# Patient Record
Sex: Male | Born: 1945 | Race: Black or African American | Hispanic: No | Smoking: Current every day smoker
Health system: Southern US, Community
[De-identification: ages and names within clinical notes are randomized; demographics above are authoritative.]

## PROBLEM LIST (undated history)

## (undated) DIAGNOSIS — J449 Chronic obstructive pulmonary disease, unspecified: Secondary | ICD-10-CM

## (undated) DIAGNOSIS — N189 Chronic kidney disease, unspecified: Secondary | ICD-10-CM

## (undated) DIAGNOSIS — I10 Essential (primary) hypertension: Secondary | ICD-10-CM

## (undated) DIAGNOSIS — E119 Type 2 diabetes mellitus without complications: Secondary | ICD-10-CM

## (undated) DIAGNOSIS — IMO0002 Reserved for concepts with insufficient information to code with codable children: Secondary | ICD-10-CM

## (undated) DIAGNOSIS — G4730 Sleep apnea, unspecified: Secondary | ICD-10-CM

## (undated) DIAGNOSIS — I2510 Atherosclerotic heart disease of native coronary artery without angina pectoris: Secondary | ICD-10-CM

## (undated) DIAGNOSIS — E785 Hyperlipidemia, unspecified: Secondary | ICD-10-CM

## (undated) HISTORY — DX: Chronic kidney disease, unspecified: N18.9

## (undated) HISTORY — DX: Hyperlipidemia, unspecified: E78.5

## (undated) HISTORY — DX: Type 2 diabetes mellitus without complications: E11.9

---
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
## 1968-08-17 DIAGNOSIS — J310 Chronic rhinitis: Secondary | ICD-10-CM | POA: Insufficient documentation

## 2003-07-16 ENCOUNTER — Encounter: Admission: RE | Admit: 2003-07-16 | Discharge: 2003-10-14 | Payer: Self-pay | Admitting: Internal Medicine

## 2004-12-08 ENCOUNTER — Encounter (INDEPENDENT_AMBULATORY_CARE_PROVIDER_SITE_OTHER): Payer: Self-pay | Admitting: Specialist

## 2004-12-08 ENCOUNTER — Ambulatory Visit (HOSPITAL_COMMUNITY): Admission: RE | Admit: 2004-12-08 | Discharge: 2004-12-08 | Payer: Self-pay | Admitting: Gastroenterology

## 2006-12-02 ENCOUNTER — Inpatient Hospital Stay (HOSPITAL_COMMUNITY): Admission: AD | Admit: 2006-12-02 | Discharge: 2006-12-03 | Payer: Self-pay | Admitting: Internal Medicine

## 2006-12-03 ENCOUNTER — Ambulatory Visit: Payer: Self-pay | Admitting: Vascular Surgery

## 2006-12-03 ENCOUNTER — Encounter (INDEPENDENT_AMBULATORY_CARE_PROVIDER_SITE_OTHER): Payer: Self-pay | Admitting: Cardiology

## 2007-09-04 ENCOUNTER — Emergency Department (HOSPITAL_COMMUNITY): Admission: EM | Admit: 2007-09-04 | Discharge: 2007-09-04 | Payer: Self-pay | Admitting: *Deleted

## 2010-02-03 ENCOUNTER — Emergency Department (HOSPITAL_COMMUNITY)
Admission: EM | Admit: 2010-02-03 | Discharge: 2010-02-03 | Payer: Self-pay | Source: Home / Self Care | Admitting: Emergency Medicine

## 2010-11-02 LAB — POCT I-STAT, CHEM 8
Creatinine, Ser: 0.9 mg/dL (ref 0.4–1.5)
HCT: 52 % (ref 39.0–52.0)
Sodium: 140 mEq/L (ref 135–145)

## 2011-01-02 NOTE — Consult Note (Signed)
[name], [name]             ACCOUNT NO.:  [id number]   MEDICAL RECORD NO.:  [id number]          PATIENT TYPE:  INP   LOCATION:  4728                         FACILITY:  [facility]   PHYSICIAN:  Francisca December, M.D.  DATE OF BIRTH:  07/08/1946   DATE OF CONSULTATION:  12/02/2006  DATE OF DISCHARGE:                                 CONSULTATION   REASON FOR REFERRAL:  Cough-induced syncope.   HISTORY OF PRESENT ILLNESS:  [name] is a pleasant 65-year-  old gentleman who has a history of elevated cholesterol and hypertension  but without prior cardiac diagnoses.  He has been experiencing a very  productive cough of clear phlegm for the past two weeks.  Most recently  he has had very severe coughing spells which have induced syncope on  three occasions; one observed in the physician's office this morning.  He has a remote history of this as well.  Approximately one year ago he  had a coughing spell while driving and almost passed out.  There is  associated flushing and warm sensation, but it does not quite break into  a sweat.  No nausea.  He has not had chest discomfort, shortness of  breath, lightheadedness.  Of note, his daughter states that he appears  to be almost gagging when he has passed out recently.   PAST MEDICAL HISTORY:  1. Elevated cholesterol.  2. Erectile dysfunction.  3. Hypertension.  4. Tobacco usage.   CURRENT MEDICATIONS:  1. Caduet 5/10 one p.o. daily.  2. Aspirin 81 mg p.o. daily.  3. Viagra p.r.n.   DRUG ALLERGIES:  None known.   PAST SURGICAL HISTORY:  None.   FAMILY HISTORY:  Father died of coronary disease and had bypass surgery  and diabetes.  Father's unknown.   SOCIAL HISTORY:  He is divorced with four children.  He is an Ship broker.  He has a 40 pack-year history of tobacco use, occasional  ethanol.  No illicit drugs.   REVIEW OF SYSTEMS:  Otherwise, negative.   PHYSICAL EXAMINATION:  GENERAL:  This is a 65 year old  pleasant,  cooperative African-American male in no distress, well-kempt.  HEENT:  Unremarkable.  NECK:  Supple without thyromegaly or masses, and carotid upstrokes are  normal.  There is no bruit.  There is no JVD.  CHEST:  Clear with good excursion bilaterally.  HEART:  Slightly bradycardic, normal S1, S2.  No murmur, click, gallop  or rub.  ABDOMEN:  Soft, nontender.  No midline pulsatile mass.  GENITALIA:  External genitalia:  Without lesions.  RECTAL:  Not performed.  EXTREMITIES:  Show full range of motion.  No edema.  Intact distal  pulses.  NEUROLOGICALLY:  There are no focal deficits.  SKIN:  Warm, dry, and clear.   ACCESSORY CLINICAL DATA:  Much of this is pending.  Electrocardiogram  from the office shows sinus rhythm and normal EKG.   The rate on his resting EKG and telemetry is in the 50 beat per minute  range.   Chest x-ray is pending.   I performed carotid sinus massage bilaterally without significant  heart  rate slowing.  I also had him cough for about 30 seconds without any  significant slowing.  His heart rate was observed on the monitor at all  times.  He relates that he is not able to reproduce the intense cough  that  produced his passing out recently.   IMPRESSION:  1. Cough-induced syncope very likely vagal mediated.  2. Resting bradycardia and the absence of any heart rate slowing      medications suggest sinus node dysfunction.  3. Hypertension.  4. Elevated cholesterol.  5. Erectile dysfunction.   PLAN:  1. Therapy at this point should be directed at relief of cough and      include antihistamines, Mucinex, cough suppressant.  2. Antibiotics at your discretion.  3. Check chest x-ray.  4. 2-D echocardiogram.  5. Telemetry monitoring and possibly ambulatory 24-hour monitoring as      an outpatient.  6. I will discuss this syndrome with my colleagues and have a better      impression of the importance of a pacemaker in their view.      Certainly  the concern is in the future with a spontaneous coughing      episode he could have syncope at any time possibly inducing harm to      himself or others.   Thank you very much for allowing me to assist in the care of [name].  It has been a pleasure to do so.  I will discuss his further  care with you.      Francisca December, M.D.  Electronically Signed     JHE/MEDQ  D:  12/02/2006  T:  12/03/2006  Job:  [id number]   cc:   Georgann Housekeeper, MD

## 2011-01-02 NOTE — Discharge Summary (Signed)
NAMEFERLANDO, Robert Combs             ACCOUNT NO.:  [id number]   MEDICAL RECORD NO.:  [id number]          PATIENT TYPE:  INP   LOCATION:  4728                         FACILITY:  [facility]   PHYSICIAN:  Georgann Housekeeper, MD      DATE OF BIRTH:  [birth year]   DATE OF ADMISSION:  12/02/2006  DATE OF DISCHARGE:  12/03/2006                               DISCHARGE SUMMARY   DISCHARGE DIAGNOSES:  1. Syncope, cough related.  2. History of hypertension.  3. History of bronchitis.  4. Elevated CPK secondary to possible statin.   MEDICATIONS AT DISCHARGE:  1. Caduet.  2. Norvasc 5/10 mg.  3. Aspirin 325 mg daily.   CONSULTATIONS:  Cardiology.   PROCEDURE:  2D echocardiogram.  Normal echocardiogram.  Carotid  ultrasounds negative for any blockage.   HOSPITAL COURSE:  The patient is 65 years old.  He has a history of  hypertension and dyslipidemia.  He was admitted after multiple syncopal  episodes.  Each one after coughing for a few seconds.  The patient was  admitted to telemetry to rule out for any arrhythmias.  Cardiology was  consulted.  Cardiac markers were negative.  Doppler carotid  echocardiogram was negative.  The patient had no more syncopal episodes.  There was no evidence of brady arrhythmias.  The patient finished a  course of antibiotics.  As far as his CPK elevation subsequent to his  Lipitor, which was stopped and cardiac markers started to come down.  He  will followup as an outpatient.  As far as the bronchitis continue on Z-  Pak.  Finish the course as well as the cough syrup.  The patient was  discharged home to followup with cardiology.  The patient also had a  Cardiolite stress test which was negative.      Georgann Housekeeper, MD  Electronically Signed     KH/MEDQ  D:  02/24/2007  T:  02/24/2007  Job:  [id number]

## 2011-01-02 NOTE — H&P (Signed)
Robert Combs, Robert Combs             ACCOUNT NO.:  [id number]   MEDICAL RECORD NO.:  [id number]          PATIENT TYPE:  INP   LOCATION:  4728                         FACILITY:  [facility]   PHYSICIAN:  Georgann Housekeeper, MD      DATE OF BIRTH:  16-Jul-1946   DATE OF ADMISSION:  12/02/2006  DATE OF DISCHARGE:                              HISTORY & PHYSICAL   STAT HISTORY AND PHYSICAL   CHIEF COMPLAINT:  Passed out.   HISTORY OF PRESENT ILLNESS:  This is a 65 year old male with history of  hypertension, dyslipidemia, and tobacco use, came into the office.  He  was at work, he works at [employer] in Air[employer], passed out  two times, each of them after a coughing spell.  The patient denied any  chest pain or shortness of breath.  He does smoke tobacco, and he said  he has had this cough for like two or three days.  He was seen in the  office by the nurse practitioner.  At that time, he had no complaints.  His EKG was normal sinus rhythm with a heart rate of 57.  The patient  was scheduled for an outpatient Holter, but while he was walking out of  the office he had a coughing spell and passed out again.  He was  admitted for further evaluation.  The patient denied any seizure  activity, no dizziness, no fevers.  No nausea or vomiting or  palpitations.  He reportedly had a similar event, he said, and almost  passed out about two or three years ago when he had a bronchitis and  also after the coughing spell.   PAST MEDICAL HISTORY:  1. No known drug allergies.  2. Tobacco use.  3. Hypertension.  4. Erectile dysfunction.  5. Dyslipidemia.  6. History of bronchitis.   CURRENT MEDICATIONS:  1. Caduet 5 one daily.  2. Viagra p.r.n.   PAST SURGICAL HISTORY:  None.   FAMILY HISTORY:  Positive for coronary artery disease in mom, and  diabetes.   HABITS:  Tobacco about a pack a day or less, he works for [employer], two children, married.   REVIEW OF SYSTEMS:  As  above.   PHYSICAL EXAMINATION:  VITAL SIGNS:  Temperature 97.7, blood pressure  140/92, pulse 72.  GENERAL:  Well-appearing male in no acute distress, sitting.  LUNGS:  Occasional rhonchi.  CARDIOVASCULAR:  Normal S1 and S2, no murmurs, no carotid bruits, no S3.  ABDOMEN:  Soft, good bowel sounds.  EXTREMITIES:  No edema.   IMPRESSION:  Syncopal episode after cough most likely precipitated by  that, had two different spells today.   PROBLEM LIST:  1. Syncope:  Will get a telemetry evaluation and cardiac markers, a      Cardiology consult, and given his cardiac risk factors to rule out      any ischemic heart disease or arrhythmia.  2. History of tobacco use and cough:  Probably bronchitis.  Will get a      chest x-ray, get his blood counts and blood chemistries checked  also, put him on Zithromycin antibiotic p.o. and Tussionex cough      syrup.  Will also set up an echocardiogram.  I will get Cardiology      to follow along.      Georgann Housekeeper, MD  Electronically Signed     KH/MEDQ  D:  12/02/2006  T:  12/02/2006  Job:  [id number]

## 2011-01-02 NOTE — Op Note (Signed)
[name], [name]             ACCOUNT NO.:  [id number]   MEDICAL RECORD NO.:  [id number]          PATIENT TYPE:  AMB   LOCATION:  ENDO                         FACILITY:  [facility]   PHYSICIAN:  Danise Edge, M.D.   DATE OF BIRTH:  11-11-1945   DATE OF PROCEDURE:  12/08/2004  DATE OF DISCHARGE:                                 OPERATIVE REPORT   PROCEDURE:  Colonoscopy.   INDICATIONS:  [name] is a 65 year old male, born August 01, 1946.  [name] is scheduled to undergo his first screening colonoscopy  with polypectomy to prevent colon cancer.   ENDOSCOPIST:  Danise Edge, M.D.   PREMEDICATION:  Versed 7 mg, Demerol 70 mg.   DESCRIPTION OF PROCEDURE:  After obtaining informed consent, [name]  was placed in the left lateral decubitus position.  I administered  intravenous Demerol and intravenous Versed to achieve conscious sedation for  the procedure.  The patient's blood pressure, oxygen saturation and cardiac  rhythm were monitored throughout the procedure and documented in the medical  record.   Anal inspection was normal.  Digital rectal exam reveals a non-nodular  prostate.  The Olympus adjustable pediatric colonoscope was introduced into  the rectum and advanced to the cecum.  A normal-appearing ileocecal valve  was intubated and the distal ileum inspected.  Colonic preparation for the  exam today was excellent.   Rectum:  Normal.  Retroflexed view of the distal rectum normal.  Sigmoid colon and descending colon:  From the distal sigmoid colon at 30 cm  from the anal verge, a 1 mm diminutive polyp was removed with the cold  biopsy forceps.  Splenic flexure:  Normal.  Transverse colon:  Normal.  Hepatic flexure:  Normal.  Ascending colon:  Normal.  Cecum and ileocecal valve:  Normal.  Distal ileum:  Normal.   ASSESSMENT:  A diminutive polyp was removed from the distal sigmoid colon.  Otherwise normal proctocolonoscopy to the cecum with  inspection of the  distal ileum.      MJ/MEDQ  D:  12/08/2004  T:  12/08/2004  Job:  [id number]   cc:   Georgann Housekeeper, MD  [address]  [place]  [place] [address]  Fax: [phone]

## 2011-05-07 LAB — CBC
HCT: 47.3
Hemoglobin: 15.9
MCHC: 33.7
Platelets: 167
WBC: 8.7

## 2011-05-07 LAB — URINALYSIS, ROUTINE W REFLEX MICROSCOPIC
Ketones, ur: NEGATIVE
Leukocytes, UA: NEGATIVE
Nitrite: NEGATIVE
Protein, ur: NEGATIVE
Specific Gravity, Urine: 1.024

## 2011-05-07 LAB — BASIC METABOLIC PANEL
Calcium: 9.1
Chloride: 101
GFR calc Af Amer: >60

## 2011-05-07 LAB — DIFFERENTIAL
Basophils Relative: 0
Eosinophils Relative: 0
Lymphocytes Relative: 24
Lymphs Abs: 2.1
Neutro Abs: 6.1

## 2011-05-07 LAB — URINE MICROSCOPIC-ADD ON

## 2011-07-06 ENCOUNTER — Emergency Department (HOSPITAL_COMMUNITY)
Admission: EM | Admit: 2011-07-06 | Discharge: 2011-07-06 | Disposition: A | Discharge status: Home / Self Care | Payer: Non-veteran care | Attending: Emergency Medicine | Admitting: Emergency Medicine

## 2011-07-06 ENCOUNTER — Encounter: Payer: Self-pay | Admitting: *Deleted

## 2011-07-06 ENCOUNTER — Other Ambulatory Visit: Payer: Self-pay

## 2011-07-06 ENCOUNTER — Emergency Department (HOSPITAL_COMMUNITY): Payer: Non-veteran care

## 2011-07-06 DIAGNOSIS — R079 Chest pain, unspecified: Secondary | ICD-10-CM | POA: Insufficient documentation

## 2011-07-06 DIAGNOSIS — I2510 Atherosclerotic heart disease of native coronary artery without angina pectoris: Secondary | ICD-10-CM | POA: Insufficient documentation

## 2011-07-06 DIAGNOSIS — R059 Cough, unspecified: Secondary | ICD-10-CM | POA: Insufficient documentation

## 2011-07-06 DIAGNOSIS — I10 Essential (primary) hypertension: Secondary | ICD-10-CM | POA: Insufficient documentation

## 2011-07-06 DIAGNOSIS — J4489 Other specified chronic obstructive pulmonary disease: Secondary | ICD-10-CM | POA: Insufficient documentation

## 2011-07-06 DIAGNOSIS — F17200 Nicotine dependence, unspecified, uncomplicated: Secondary | ICD-10-CM | POA: Insufficient documentation

## 2011-07-06 DIAGNOSIS — R05 Cough: Secondary | ICD-10-CM | POA: Insufficient documentation

## 2011-07-06 DIAGNOSIS — R0602 Shortness of breath: Secondary | ICD-10-CM | POA: Insufficient documentation

## 2011-07-06 DIAGNOSIS — J449 Chronic obstructive pulmonary disease, unspecified: Secondary | ICD-10-CM | POA: Insufficient documentation

## 2011-07-06 HISTORY — DX: Chronic obstructive pulmonary disease, unspecified: J44.9

## 2011-07-06 HISTORY — DX: Atherosclerotic heart disease of native coronary artery without angina pectoris: I25.10

## 2011-07-06 HISTORY — DX: Reserved for concepts with insufficient information to code with codable children: IMO0002

## 2011-07-06 HISTORY — DX: Essential (primary) hypertension: I10

## 2011-07-06 LAB — DIFFERENTIAL
Eosinophils Absolute: 0.1 10*3/uL (ref 0.0–0.7)
Eosinophils Relative: 1 % (ref 0–5)
Monocytes Absolute: 0.4 10*3/uL (ref 0.1–1.0)
Monocytes Relative: 6 % (ref 3–12)
Neutro Abs: 2.4 10*3/uL (ref 1.7–7.7)
Neutrophils Relative %: 38 % — ABNORMAL LOW (ref 43–77)

## 2011-07-06 LAB — CBC
MCH: 33.5 pg (ref 26.0–34.0)
MCHC: 35.8 g/dL (ref 30.0–36.0)
MCV: 93.4 fL (ref 78.0–100.0)
Platelets: 156 10*3/uL (ref 150–400)
RDW: 12.9 % (ref 11.5–15.5)
WBC: 6.3 10*3/uL (ref 4.0–10.5)

## 2011-07-06 LAB — COMPREHENSIVE METABOLIC PANEL
Chloride: 104 mEq/L (ref 96–112)
Creatinine, Ser: 0.67 mg/dL (ref 0.50–1.35)
GFR calc non Af Amer: >90 mL/min (ref >90)
Glucose, Bld: 102 mg/dL — ABNORMAL HIGH (ref 70–99)

## 2011-07-06 LAB — TROPONIN I: Troponin I: <0.3 ng/mL (ref <0.30)

## 2011-07-06 MED ORDER — NITROGLYCERIN IN D5W 200-5 MCG/ML-% IV SOLN
INTRAVENOUS | Status: AC
  Administered 2011-07-06: 5 ug/min via INTRAVENOUS
  Filled 2011-07-06: qty 250

## 2011-07-06 MED ORDER — NITROGLYCERIN IN D5W 200-5 MCG/ML-% IV SOLN
5.0000 ug/min | INTRAVENOUS | Status: DC
  Administered 2011-07-06: 5 ug/min via INTRAVENOUS

## 2011-07-06 MED ORDER — SODIUM CHLORIDE 0.9 % IV SOLN
INTRAVENOUS | Status: DC
  Administered 2011-07-06: 13:00:00 via INTRAVENOUS

## 2011-07-06 MED ORDER — NITROGLYCERIN 0.4 MG SL SUBL
0.4000 mg | SUBLINGUAL_TABLET | Freq: Once | SUBLINGUAL | Status: AC
Start: 2011-07-06 — End: 2011-07-06
  Administered 2011-07-06: 0.4 mg via SUBLINGUAL
  Filled 2011-07-06: qty 75

## 2011-07-06 NOTE — ED Notes (Signed)
Pt states no chest pain or pressure at present, aware to report any so we can increase ntg gtt as needed

## 2011-07-06 NOTE — ED Notes (Signed)
Gave new ECG to Dr. Rubin Payor as soon as I performed. 12:15 JG

## 2011-07-06 NOTE — ED Notes (Signed)
Pt states has used sl nitro at home and relieved chest pressure. States has had pressure for several days off and on.

## 2011-07-06 NOTE — ED Provider Notes (Addendum)
History     CSN: [id number] Arrival date & time: 07/06/2011 12:02 PM   First MD Initiated Contact with Patient 07/06/11 1224      Chief Complaint  Patient presents with  . Chest Pain    (Consider location/radiation/quality/duration/timing/severity/associated sxs/prior treatment) Patient is a 65 y.o. male presenting with chest pain. The history is provided by the patient.  Chest Pain Primary symptoms include shortness of breath and cough. Pertinent negatives for primary symptoms include no abdominal pain, no nausea and no vomiting.  Pertinent negatives for associated symptoms include no numbness and no weakness.    patient has been having episodes of chest pressure. Is worse with exertion. He states he's had over the last few months. Worse over the last couple weeks. He states he seen his primary care was started on nitroglycerin for it. He smokes. No fevers. He was given nitroglycerin that helps relieve the pain. He is able to do less physical activity than he was able to before. He has not seen a cardiologist previously. No nausea vomiting or diarrhea. He has a chronic cough. He states he has not had any heart problems he knows about. No recent travel.  Past Medical History  Diagnosis Date  . Coronary artery disease   . Hypertension   . COPD (chronic obstructive pulmonary disease)     History reviewed. No pertinent past surgical history.  History reviewed. No pertinent family history.  History  Substance Use Topics  . Smoking status: Not on file  . Smokeless tobacco: Not on file  . Alcohol Use: No      Review of Systems  Constitutional: Negative for activity change and appetite change.  HENT: Negative for neck stiffness.   Eyes: Negative for pain.  Respiratory: Positive for cough, chest tightness and shortness of breath.   Cardiovascular: Positive for chest pain. Negative for leg swelling.  Gastrointestinal: Negative for nausea, vomiting, abdominal pain and diarrhea.    Genitourinary: Negative for flank pain.  Musculoskeletal: Negative for back pain.  Skin: Negative for rash.  Neurological: Negative for weakness, numbness and headaches.  Psychiatric/Behavioral: Negative for behavioral problems.    Allergies  Review of patient's allergies indicates no known allergies.  Home Medications  No current outpatient prescriptions on file.  BP 131/70  Pulse 48  Temp(Src) 97.8 F (36.6 C) (Oral)  Resp 17  SpO2 99%  Physical Exam  Nursing note and vitals reviewed. Constitutional: He is oriented to person, place, and time. He appears well-developed and well-nourished.  HENT:  Head: Normocephalic and atraumatic.  Eyes: EOM are normal. Pupils are equal, round, and reactive to light.  Neck: Normal range of motion. Neck supple.  Cardiovascular: Normal rate, regular rhythm and normal heart sounds.   No murmur heard. Pulmonary/Chest: Effort normal.       Mildly harsh breath sounds  Abdominal: Soft. Bowel sounds are normal. He exhibits no distension and no mass. There is no tenderness. There is no rebound and no guarding.  Musculoskeletal: Normal range of motion. He exhibits no edema.  Neurological: He is alert and oriented to person, place, and time. No cranial nerve deficit.  Skin: Skin is warm and dry.  Psychiatric: He has a normal mood and affect.    ED Course  Procedures (including critical care time)  Labs Reviewed  DIFFERENTIAL - Abnormal; Notable for the following:    Neutrophils Relative 38 (*)    Lymphocytes Relative 54 (*)    All other components within normal limits  COMPREHENSIVE METABOLIC PANEL -  Abnormal; Notable for the following:    Glucose, Bld 102 (*)    All other components within normal limits  CBC  TROPONIN I  PROTIME-INR  APTT   Dg Chest Port 1 View  07/06/2011  *RADIOLOGY REPORT*  Clinical Data: Chest pain.  PORTABLE CHEST - 1 VIEW  Comparison: 12/02/2006.  Findings: Trachea is midline.  Heart size normal.  Minimal right  basilar atelectasis.  Lungs are otherwise clear.  No pleural fluid.  IMPRESSION: Minimal right basilar atelectasis.  Original Report Authenticated By: Reyes Ivan, M.D.     1. Chest pain      Date: 07/06/2011  Rate: 50  Rhythm: sinus bradycardia  QRS Axis: normal  Intervals: normal  ST/T Wave abnormalities: normal  Conduction Disutrbances:none  Narrative Interpretation:   Old EKG Reviewed: none available    MDM  Chest pain the last couple months. Worse with exertion. Leave by nitroglycerin both at home and here. It did recur here in nitroglycerin drip was started. EKG is reassuring. Cardiac enzymes are negative. He'll be admitted to [facility] cardiology.        Juliet Rude. Rubin Payor, MD 07/06/11 1532  Patient has been seen by Dr. Tenny Craw from cardiology he'll be discharging the patient home to follow with them.  Juliet Rude. Rubin Payor, MD 07/06/11 1640

## 2011-07-06 NOTE — ED Notes (Signed)
To ed for eval of cp. Intermittent over the past cple of weeks. Pt skin w/d, resp e/u. Told by cardiologist to come to see edp

## 2011-07-06 NOTE — Consult Note (Signed)
Cardiology Consult Note   Patient ID: Robert Combs MRN: [id number], DOB/AGE: 02-15-1946   Admit date: 07/06/2011 Date of Consult: 07/06/2011  Primary Physician: No primary provider on file. Primary Cardiologist: None  Pt. Profile: Robert Combs is a 65 year old African American male with past medical history significant for HTN, 20-30 pack-year history, COPD and anxiety, ACS rule out in 2008 (negative CEs, negative Cardiolite stress test) presenting to [facility] ED with chest pain.   Problem List: Past Medical History  Diagnosis Date  . Coronary artery disease   . Hypertension   . COPD (chronic obstructive pulmonary disease)     History reviewed. No pertinent past surgical history.   Allergies: No Known Allergies  HPI:   He states that for the past 2-3 months he has been experiencing intermittent, dull, substernal chest pain  occasionally radiating to his shoulder. He states that he experiences "heart burn" aggravated by eating certain foods. He presented to the [place], [place] with similar complaints. An EKG was ordered revealing TW changes, and he was given Nitro SL and advised to go to the hospital. The patient waited through the weekend to see if his symptoms improved, and presents today with similar complaints beginning upon waking this morning. He relates this pain to his "heart burn" and relieved with belching. He reports occasional SOB associated with this cp. He endorses occasionally becoming dizzy in the yard while bending over. He denies palpitations, diaphoresis, lightheadedness, n/v or abdominal pain.  He took Nitro SL x 1 today which alleviated his pain somewhat and was started on a Nitro drip in the ED. EKG revealed sinus bradycardia with no ST-T wave changes. CXR unremarkable. POC TnI negative. Chem-7, CBC WNL.   Inpatient Medications:    . nitroGLYCERIN  0.4 mg Sublingual Once    History reviewed. No pertinent family history.   History   Social History  .  Marital Status: Married    Spouse Name: N/A    Number of Children: Four  . Years of Education: N/A   Occupational History  . Community education officer   Social History Main Topics  . Smoking status: Smoker; 20-30 pack-year history  . Smokeless tobacco: Not on file  . Alcohol Use: 2-3 shots/weekend  . Drug Use: None  . Sexually Active:    Other Topics Concern  . Not on file   Social History Narrative  . No narrative on file     Review of Systems: General: negative for chills, fever, night sweats Cardiovascular: negative for dyspnea on exertion, edema, orthopnea, palpitations, paroxysmal nocturnal dyspnea M/S: positive for back pain Respiratory: negative for cough or wheezing Abdominal: negative for nausea, vomiting, diarrhea All other systems reviewed and are otherwise negative except as noted above.  Physical Exam: Blood pressure 142/65, pulse 47, temperature 97.8 F (36.6 C), temperature source Oral, resp. rate 15, SpO2 99.00%.   General: Well developed, well nourished, in no acute distress. Head: Normocephalic, atraumatic, sclera non-icteric  Neck: Negative for carotid bruits. JVD not elevated. Supple. Lungs: Clear bilaterally to auscultation without wheezes, rales, or rhonchi. Breathing is unlabored. Heart: RRR with S1 S2. No murmurs, rubs, heaves, thrills or gallops appreciated. Abdomen: Soft, non-tender, non-distended with normoactive bowel sounds. No hepatomegaly. No rebound/guarding. No obvious abdominal masses. Msk:  Strength and tone appears normal for age. Extremities: No clubbing, cyanosis or edema.  Distal pedal pulses are 2+ and equal bilaterally. Neuro: Alert and oriented X 3. Moves all extremities spontaneously. Psych:  Responds to questions appropriately with a  normal affect.  Labs:   Lab Results  Component Value Date   WBC 6.3 07/06/2011   HGB 16.3 07/06/2011   HCT 45.5 07/06/2011   MCV 93.4 07/06/2011   PLT 156 07/06/2011    Lab 07/06/11 1250  NA 140  K  3.9  CL 104  CO2 25  BUN 13  CREATININE 0.67  CALCIUM 9.2  PROT 7.4  BILITOT 0.5  ALKPHOS 67  ALT 30  AST 36  GLUCOSE 102*   Lab Results  Component Value Date   TROPONINI <0.30 07/06/2011    Radiology/Studies: Dg Chest Port 1 View  07/06/2011  *RADIOLOGY REPORT*  Clinical Data: Chest pain.  PORTABLE CHEST - 1 VIEW  Comparison: 12/02/2006.  Findings: Trachea is midline.  Heart size normal.  Minimal right basilar atelectasis.  Lungs are otherwise clear.  No pleural fluid.  IMPRESSION: Minimal right basilar atelectasis.  Original Report Authenticated By: Reyes Ivan, M.D.    EKG:  Sinus bradycardia, 50 bpm, no ST-T wave changes  ASSESSMENT AND PLAN:   1. Chest pain- POC TnI negative, EKG revealed no acute changes, CXR unremarkable, EKG was faxed from [facility] hospital from 11/16 which revealed atypical, inconsistent TW changes. He can be discharged home with follow-up at [facility] office in 2 weeks and outpatient stress may be scheduled for further evaluation to be determined at that time. He has two cardiac risk factors in HTN and tobacco abuse, but does not have previous cardiac history. He is quite active, his symptoms are nonexertional and are likely reflux-mediated based on history. Pt does not take PPI. Will prescribe and reassess quality/frequency of chest pain on follow-up.  - Omeprazole 20mg  - continue EC ASA 81mg    2. Hyperglycemic- fasting glucose of 102, will advise to follow-up with PCP as he has a significant family history of type 2 DM.  Signed, Odella Aquas , PA-C 07/06/2011, 4:00 PM    Patient was seen in teh ER with R. Arguello.  Old EKG obtained.  Had similar changes.  CP was somewhat atypical.  Plan as noted above. Dietrich Pates

## 2011-07-06 NOTE — ED Notes (Signed)
No c/o at present and informed to call for returning pressure or pain in  His chest. Denies needs at present.

## 2011-07-06 NOTE — ED Notes (Signed)
Pt states headache from ntg but pressure is relieved.

## 2011-07-22 ENCOUNTER — Encounter: Payer: Non-veteran care | Admitting: Physician Assistant

## 2013-06-14 DIAGNOSIS — M51369 Other intervertebral disc degeneration, lumbar region without mention of lumbar back pain or lower extremity pain: Secondary | ICD-10-CM | POA: Insufficient documentation

## 2013-07-26 ENCOUNTER — Ambulatory Visit: Payer: Non-veteran care | Attending: Neurosurgery | Admitting: Physical Therapy

## 2013-07-26 DIAGNOSIS — M545 Low back pain, unspecified: Secondary | ICD-10-CM | POA: Insufficient documentation

## 2013-07-26 DIAGNOSIS — M542 Cervicalgia: Secondary | ICD-10-CM | POA: Insufficient documentation

## 2013-07-26 DIAGNOSIS — J438 Other emphysema: Secondary | ICD-10-CM | POA: Insufficient documentation

## 2013-07-26 DIAGNOSIS — IMO0001 Reserved for inherently not codable concepts without codable children: Secondary | ICD-10-CM | POA: Insufficient documentation

## 2013-07-26 DIAGNOSIS — M2560 Stiffness of unspecified joint, not elsewhere classified: Secondary | ICD-10-CM | POA: Insufficient documentation

## 2013-07-26 DIAGNOSIS — I10 Essential (primary) hypertension: Secondary | ICD-10-CM | POA: Insufficient documentation

## 2013-07-31 ENCOUNTER — Ambulatory Visit: Payer: Non-veteran care | Admitting: Rehabilitation

## 2013-08-02 ENCOUNTER — Ambulatory Visit: Payer: Non-veteran care | Admitting: Rehabilitation

## 2013-08-08 ENCOUNTER — Ambulatory Visit: Payer: Non-veteran care | Admitting: Rehabilitation

## 2013-08-14 ENCOUNTER — Encounter: Payer: Non-veteran care | Admitting: Rehabilitation

## 2013-08-21 ENCOUNTER — Ambulatory Visit: Payer: Non-veteran care | Attending: Neurosurgery | Admitting: Physical Therapy

## 2013-08-21 DIAGNOSIS — M545 Low back pain, unspecified: Secondary | ICD-10-CM | POA: Insufficient documentation

## 2013-08-21 DIAGNOSIS — J438 Other emphysema: Secondary | ICD-10-CM | POA: Insufficient documentation

## 2013-08-21 DIAGNOSIS — M542 Cervicalgia: Secondary | ICD-10-CM | POA: Insufficient documentation

## 2013-08-21 DIAGNOSIS — M2560 Stiffness of unspecified joint, not elsewhere classified: Secondary | ICD-10-CM | POA: Insufficient documentation

## 2013-08-21 DIAGNOSIS — IMO0001 Reserved for inherently not codable concepts without codable children: Secondary | ICD-10-CM | POA: Insufficient documentation

## 2013-08-21 DIAGNOSIS — I10 Essential (primary) hypertension: Secondary | ICD-10-CM | POA: Insufficient documentation

## 2013-08-25 ENCOUNTER — Encounter: Payer: Non-veteran care | Admitting: Physical Therapy

## 2015-11-04 ENCOUNTER — Emergency Department (HOSPITAL_COMMUNITY)
Admission: EM | Admit: 2015-11-04 | Discharge: 2015-11-04 | Disposition: A | Discharge status: Home / Self Care | Payer: Non-veteran care | Attending: Emergency Medicine | Admitting: Emergency Medicine

## 2015-11-04 ENCOUNTER — Encounter (HOSPITAL_COMMUNITY): Payer: Self-pay | Admitting: Emergency Medicine

## 2015-11-04 DIAGNOSIS — H538 Other visual disturbances: Secondary | ICD-10-CM | POA: Insufficient documentation

## 2015-11-04 DIAGNOSIS — Z79899 Other long term (current) drug therapy: Secondary | ICD-10-CM | POA: Diagnosis not present

## 2015-11-04 DIAGNOSIS — J449 Chronic obstructive pulmonary disease, unspecified: Secondary | ICD-10-CM | POA: Diagnosis not present

## 2015-11-04 DIAGNOSIS — F17210 Nicotine dependence, cigarettes, uncomplicated: Secondary | ICD-10-CM | POA: Insufficient documentation

## 2015-11-04 DIAGNOSIS — D696 Thrombocytopenia, unspecified: Secondary | ICD-10-CM

## 2015-11-04 DIAGNOSIS — I2510 Atherosclerotic heart disease of native coronary artery without angina pectoris: Secondary | ICD-10-CM | POA: Diagnosis not present

## 2015-11-04 DIAGNOSIS — E1165 Type 2 diabetes mellitus with hyperglycemia: Secondary | ICD-10-CM | POA: Diagnosis present

## 2015-11-04 DIAGNOSIS — Z7982 Long term (current) use of aspirin: Secondary | ICD-10-CM | POA: Diagnosis not present

## 2015-11-04 DIAGNOSIS — R358 Other polyuria: Secondary | ICD-10-CM | POA: Insufficient documentation

## 2015-11-04 DIAGNOSIS — N179 Acute kidney failure, unspecified: Secondary | ICD-10-CM | POA: Diagnosis not present

## 2015-11-04 DIAGNOSIS — R631 Polydipsia: Secondary | ICD-10-CM | POA: Diagnosis not present

## 2015-11-04 DIAGNOSIS — E119 Type 2 diabetes mellitus without complications: Secondary | ICD-10-CM

## 2015-11-04 DIAGNOSIS — I10 Essential (primary) hypertension: Secondary | ICD-10-CM | POA: Diagnosis not present

## 2015-11-04 DIAGNOSIS — R3589 Other polyuria: Secondary | ICD-10-CM

## 2015-11-04 LAB — BASIC METABOLIC PANEL
Anion gap: 12 (ref 5–15)
BUN: 11 mg/dL (ref 6–20)
CHLORIDE: 101 mmol/L (ref 101–111)
CO2: 22 mmol/L (ref 22–32)
Calcium: 9.6 mg/dL (ref 8.9–10.3)
Creatinine, Ser: 1.26 mg/dL — ABNORMAL HIGH (ref 0.61–1.24)
GFR calc non Af Amer: 57 mL/min — ABNORMAL LOW (ref >60)
Glucose, Bld: 528 mg/dL — ABNORMAL HIGH (ref 65–99)
POTASSIUM: 4.5 mmol/L (ref 3.5–5.1)
SODIUM: 135 mmol/L (ref 135–145)

## 2015-11-04 LAB — URINE MICROSCOPIC-ADD ON
Bacteria, UA: NONE SEEN
RBC / HPF: NONE SEEN RBC/hpf (ref 0–5)

## 2015-11-04 LAB — URINALYSIS, ROUTINE W REFLEX MICROSCOPIC
Bilirubin Urine: NEGATIVE
Hgb urine dipstick: NEGATIVE
KETONES UR: NEGATIVE mg/dL
LEUKOCYTES UA: NEGATIVE
NITRITE: NEGATIVE
PROTEIN: NEGATIVE mg/dL
Specific Gravity, Urine: 1.038 — ABNORMAL HIGH (ref 1.005–1.030)
pH: 5 (ref 5.0–8.0)

## 2015-11-04 LAB — CBC WITH DIFFERENTIAL/PLATELET
BASOS PCT: 1 %
Basophils Absolute: 0 10*3/uL (ref 0.0–0.1)
EOS ABS: 0.1 10*3/uL (ref 0.0–0.7)
Eosinophils Relative: 1 %
HCT: 44.5 % (ref 39.0–52.0)
HEMOGLOBIN: 15.4 g/dL (ref 13.0–17.0)
LYMPHS ABS: 3.2 10*3/uL (ref 0.7–4.0)
Lymphocytes Relative: 54 %
MCH: 32.2 pg (ref 26.0–34.0)
MCHC: 34.6 g/dL (ref 30.0–36.0)
MCV: 93.1 fL (ref 78.0–100.0)
Monocytes Absolute: 0.2 10*3/uL (ref 0.1–1.0)
Monocytes Relative: 4 %
NEUTROS PCT: 40 %
Neutro Abs: 2.4 10*3/uL (ref 1.7–7.7)
Platelets: 133 10*3/uL — ABNORMAL LOW (ref 150–400)
RBC: 4.78 MIL/uL (ref 4.22–5.81)
RDW: 12.7 % (ref 11.5–15.5)
WBC: 5.9 10*3/uL (ref 4.0–10.5)

## 2015-11-04 LAB — I-STAT VENOUS BLOOD GAS, ED
Bicarbonate: 26.3 mEq/L — ABNORMAL HIGH (ref 20.0–24.0)
O2 Saturation: 75 %
PCO2 VEN: 47.1 mmHg (ref 45.0–50.0)
PO2 VEN: 42 mmHg (ref 31.0–45.0)
TCO2: 28 mmol/L (ref 0–100)
pH, Ven: 7.355 — ABNORMAL HIGH (ref 7.250–7.300)

## 2015-11-04 LAB — CBG MONITORING, ED
GLUCOSE-CAPILLARY: 549 mg/dL — AB (ref 65–99)
Glucose-Capillary: 347 mg/dL — ABNORMAL HIGH (ref 65–99)

## 2015-11-04 LAB — MAGNESIUM: MAGNESIUM: 2.1 mg/dL (ref 1.7–2.4)

## 2015-11-04 LAB — PHOSPHORUS: PHOSPHORUS: 3.8 mg/dL (ref 2.5–4.6)

## 2015-11-04 MED ORDER — METFORMIN HCL 500 MG PO TABS: ORAL_TABLET | ORAL | Status: DC

## 2015-11-04 MED ORDER — SODIUM CHLORIDE 0.9 % IV BOLUS (SEPSIS)
2000.0000 mL | Freq: Once | INTRAVENOUS | Status: AC
  Administered 2015-11-04: 2000 mL via INTRAVENOUS

## 2015-11-04 NOTE — Discharge Instructions (Signed)
You have diabetes, which is why your blood sugar was high on your lab work. You need to stay hydrated with plenty of WATER. Start taking metformin as directed. Check your blood sugar twice daily, and keep a log of these values to show your regular doctor at your next visit. Make sure you eat low carbohydrate foods with plenty of vegetables to help regulate your sugars. Exercise at least 3-4 times per week, for at least 30 minutes per session, as this will help with your sugars as well. Follow up with your regular doctor in 1 week for recheck and ongoing management of your diabetes. Return to the ER for changes or worsening symptoms.   Type 2 Diabetes Mellitus, Adult Type 2 diabetes mellitus is a long-term (chronic) disease. In type 2 diabetes:  The pancreas does not make enough of a hormone called insulin.  The cells in the body do not respond as well to the insulin that is made.  Both of the above can happen. Normally, insulin moves sugars from food into tissue cells. This gives you energy. If you have type 2 diabetes, sugars cannot be moved into tissue cells. This causes high blood sugar (hyperglycemia).  Your doctors will set personal treatment goals for you based on your age, your medicines, how long you have had diabetes, and any other medical conditions you have. Generally, the goal of treatment is to maintain the following blood glucose levels:  Before meals (preprandial): 80-130 mg/dL.  After meals (postprandial): below 180 mg/dL.  A1c: less than 6.5-7%. HOME CARE  Have your hemoglobin A1c level checked twice a year. The level shows if your diabetes is under control or out of control.  Test your blood sugar level every day as told by your doctor.  Check your ketone levels by testing your pee (urine) when you are sick and as told.  Take your diabetes or insulin medicine as told by your doctor.  Never run out of insulin.  Adjust how much insulin you give yourself based on how  many carbs (carbohydrates) you eat. Carbs are in many foods, such as fruits, vegetables, whole grains, and dairy products.  Have a healthy snack between every healthy meal. Have 3 meals and 3 snacks a day.  Lose weight if you are overweight.  Carry a medical alert card or wear your medical alert jewelry.  Carry a 15-gram carb snack with you at all times. Examples include:  Glucose pills, 3 or 4.  Glucose gel, 15-gram tube.  Raisins, 2 tablespoons (24 grams).  Jelly beans, 6.  Animal crackers, 8.  Regular (not diet) pop, 4 ounces (120 milliliters).  Gummy treats, 9.  Notice low blood sugar (hypoglycemia) symptoms, such as:  Shaking (tremors).  Trouble thinking clearly.  Sweating.  Faster heart rate.  Headache.  Dry mouth.  Hunger.  Crabbiness (irritability).  Being worried or tense (anxious).  Restless sleep.  A change in speech or coordination.  Confusion.  Treat low blood sugar right away. If you are alert and can swallow, follow the 15:15 rule:  Take 15-20 grams of a rapid-acting glucose or carb. This includes glucose gel, glucose pills, or 4 ounces (120 milliliters) of fruit juice, regular pop, or low-fat milk.  Check your blood sugar level 15 minutes after taking the glucose.  Take 15-20 grams more of glucose if the repeat blood sugar level is still 70 mg/dL (milligrams/deciliter) or below.  Eat a meal or snack within 1 hour of the blood sugar levels going back  to normal.  Notice early symptoms of high blood sugar, such as:  Being really thirsty or drinking a lot (polydipsia).  Peeing a lot (polyuria).  Do at least 150 minutes of physical activity a week or as told.  Split the 150 minutes of activity up during the week. Do not do 150 minutes of activity in one day.  Perform exercises, such as weight lifting, at least 2 times a week or as told.  Spend no more than 90 minutes at one time inactive.  Adjust your insulin or food intake as  needed if you start a new exercise or sport.  Follow your sick-day plan when you are not able to eat or drink as usual.  Do not smoke, chew tobacco, or use electronic cigarettes.  Women who are not pregnant should drink no more than 1 drink a day. Men should drink no more than 2 drinks a day.  Only drink alcohol with food.  Ask your doctor if alcohol is safe for you.  Tell your doctor if you drink alcohol several times during the week.  See your doctor regularly.  Schedule an eye exam soon after you are told you have diabetes. Schedule exams once every year.  Check your skin and feet every day. Check for cuts, bruises, redness, nail problems, bleeding, blisters, or sores. A doctor should do a foot exam once a year.  Brush your teeth and gums twice a day. Floss once a day. Visit your dentist regularly.  Share your diabetes plan with your workplace or school.  Keep your shots that fight diseases (vaccines) up to date.  Get a flu (influenza) shot every year.  Get a pneumonia shot. If you are 32 years of age or older and you have never gotten a pneumonia shot, you might need to get two shots.  Ask your doctor which other shots you should get.  Learn how to deal with stress.  Get diabetes education and support as needed.  Ask your doctor for special help if:  You need help to maintain or improve how you do things on your own.  You need help to maintain or improve the quality of your life.  You have foot or hand problems.  You have trouble cleaning yourself, dressing, eating, or doing physical activity. GET HELP IF:  You are unable to eat or drink for more than 6 hours.  You feel sick to your stomach (nauseous) or throw up (vomit) for more than 6 hours.  Your blood sugar level is over 240 mg/dL.  There is a change in mental status.  You get another serious illness.  You have watery poop (diarrhea) for more than 6 hours.  You have been sick or have had a fever for  2 or more days and are not getting better.  You have pain when you are active. GET HELP RIGHT AWAY IF:  You have trouble breathing.  Your ketone levels are higher than your doctor says they should be. MAKE SURE YOU:  Understand these instructions.  Will watch your condition.  Will get help right away if you are not doing well or get worse.   This information is not intended to replace advice given to you by your health care provider. Make sure you discuss any questions you have with your health care provider.   Document Released: 05/12/2008 Document Revised: 12/18/2014 Document Reviewed: 03/04/2012 Elsevier Interactive Patient Education 2016 [employer].  How to Avoid Diabetes Problems You can do a lot to  prevent or slow down diabetes problems. Following your diabetes plan and taking care of yourself can reduce your risk of serious or life-threatening complications. Below, you will find certain things you can do to prevent diabetes problems. MANAGE YOUR DIABETES Follow your health care provider's, nurse educator's, and dietitian's instructions for managing your diabetes. They will teach you the basics of diabetes care. They can help answer questions you may have. Learn about diabetes and make healthy choices regarding eating and physical activity. Monitor your blood glucose level regularly. Your health care provider will help you decide how often to check your blood glucose level depending on your treatment goals and how well you are meeting them.  DO NOT USE NICOTINE Nicotine and diabetes are a dangerous combination. Nicotine raises your risk for diabetes problems. If you quit using nicotine, you will lower your risk for heart attack, stroke, nerve disease, and kidney disease. Your cholesterol and your blood pressure levels may improve. Your blood circulation will also improve. Do not use any tobacco products, including cigarettes, chewing tobacco, or electronic cigarettes. If you need  help quitting, ask your health care provider. KEEP YOUR BLOOD PRESSURE UNDER CONTROL Your health care provider will determine your individualized target blood pressure based on your age, your medicines, how long you have had diabetes, and any other medical conditions you have. Blood pressure consists of two numbers. Generally, the goal is to keep your top number (systolic pressure) at or below 130, and your bottom number (diastolic pressure) at or below 80. Your health care provider may recommend a lower target blood pressure reading, if appropriate. Meal planning, medicines, and exercise can help you reach your target blood pressure. Make sure your health care provider checks your blood pressure at every visit. KEEP YOUR CHOLESTEROL UNDER CONTROL Normal cholesterol levels will help prevent heart disease and stroke. These are the biggest health problems for people with diabetes. Keeping cholesterol levels under control can also help with blood flow. Have your cholesterol level checked at least once a year. Your health care provider may prescribe a medicine known as a statin. Statins lower your cholesterol. If you are not taking a statin, ask your health care provider if you should be. Meal planning, exercise, and medicines can help you reach your cholesterol targets.  SCHEDULE AND KEEP YOUR ANNUAL PHYSICAL EXAMS AND EYE EXAMS Your health care provider will tell you how often he or she wants to see you depending on your plan of treatment. It is important that you keep these appointments so that possible problems can be identified early and complications can be avoided or treated.  Every visit with your health care provider should include your weight, blood pressure, and an evaluation of your blood glucose control.  Your hemoglobin A1c should be checked:  At least twice a year if you are at your goal.  Every 3 months if there are changes in treatment.  If you are not meeting your goals.  Your blood  lipids should be checked yearly. You should also be checked yearly to see if you have protein in your urine (microalbumin).  Schedule a dilated eye exam within 5 years of your diagnosis if you have type 1 diabetes, and then yearly. Schedule a dilated eye exam at diagnosis if you have type 2 diabetes, and then yearly. All exams thereafter can be extended to every 2 to 3 years if one or more exams have been normal. KEEP YOUR VACCINES CURRENT It is recommended that you receive a flu (  influenza) vaccine every year. It is also recommended that you receive a pneumonia (pneumococcal) vaccine. If you are 68 years of age or older and have never received a pneumonia vaccine, this vaccine may be given as a series of two separate shots. Ask your health care provider which additional vaccines may be recommended. TAKE CARE OF YOUR FEET  Diabetes may cause you to have a poor blood supply (circulation) to your legs and feet. Because of this, the skin may be thinner, break easier, and heal more slowly. You also may have nerve damage in your legs and feet, causing decreased feeling. You may not notice minor injuries to your feet that could lead to serious problems or infections. Taking care of your feet is very important. Visual foot exams are performed at every routine medical visit. The exams check for cuts, injuries, or other problems with the feet. A comprehensive foot exam should be done yearly. This includes visual inspection as well as assessing foot pulses and testing for loss of sensation. You should also do the following:  Inspect your feet daily for cuts, calluses, blisters, ingrown toenails, and signs of infection, such as redness, swelling, or pus.  Wash and dry your feet thoroughly, especially between the toes.  Avoid soaking your feet regularly in hot water baths.  Moisturize dry skin with lotion, avoiding areas between your toes.  Cut toenails straight across and file the edges.  Avoid shoes that do  not fit well or have areas that irritate your skin.  Avoid going barefooted or wearing only socks. Your feet need protection. TAKE CARE OF YOUR TEETH People with poorly controlled diabetes are more likely to have gum (periodontal) disease. These infections make diabetes harder to control. Periodontal diseases, if left untreated, can lead to tooth loss. Brush your teeth twice a day, floss, and see your dentist for checkups and cleaning every 6 months, or 2 times a year. ASK YOUR HEALTH CARE PROVIDER ABOUT TAKING ASPIRIN Taking aspirin daily is recommended to help prevent cardiovascular disease in people with and without diabetes. Ask your health care provider if this would benefit you and what dose he or she would recommend. DRINK RESPONSIBLY Moderate amounts of alcohol (less than 1 drink per day for adult women and less than 2 drinks per day for adult men) have a minimal effect on blood glucose if ingested with food. It is important to eat food with alcohol to avoid hypoglycemia. People should avoid alcohol if they have a history of alcohol abuse or dependence, if they are pregnant, and if they have liver disease, pancreatitis, advanced neuropathy, or severe hypertriglyceridemia. LESSEN STRESS Living with diabetes can be stressful. When you are under stress, your blood glucose may be affected in two ways:  Stress hormones may cause your blood glucose to rise.  You may be distracted from taking good care of yourself. It is a good idea to be aware of your stress level and make changes that are necessary to help you better manage challenging situations. Support groups, planned relaxation, a hobby you enjoy, meditation, healthy relationships, and exercise all work to lower your stress level. If your efforts do not seem to be helping, get help from your health care provider or a trained mental health professional.   This information is not intended to replace advice given to you by your health care  provider. Make sure you discuss any questions you have with your health care provider.   Document Released: 04/21/2011 Document Revised: 08/24/2014 Document Reviewed:  09/27/2013 Elsevier Interactive Patient Education 2016 [employer].  Diabetes Mellitus and Food It is important for you to manage your blood sugar (glucose) level. Your blood glucose level can be greatly affected by what you eat. Eating healthier foods in the appropriate amounts throughout the day at about the same time each day will help you control your blood glucose level. It can also help slow or prevent worsening of your diabetes mellitus. Healthy eating may even help you improve the level of your blood pressure and reach or maintain a healthy weight.  General recommendations for healthful eating and cooking habits include:  Eating meals and snacks regularly. Avoid going long periods of time without eating to lose weight.  Eating a diet that consists mainly of plant-based foods, such as fruits, vegetables, nuts, legumes, and whole grains.  Using low-heat cooking methods, such as baking, instead of high-heat cooking methods, such as deep frying. Work with your dietitian to make sure you understand how to use the Nutrition Facts information on food labels. HOW CAN FOOD AFFECT ME? Carbohydrates Carbohydrates affect your blood glucose level more than any other type of food. Your dietitian will help you determine how many carbohydrates to eat at each meal and teach you how to count carbohydrates. Counting carbohydrates is important to keep your blood glucose at a healthy level, especially if you are using insulin or taking certain medicines for diabetes mellitus. Alcohol Alcohol can cause sudden decreases in blood glucose (hypoglycemia), especially if you use insulin or take certain medicines for diabetes mellitus. Hypoglycemia can be a life-threatening condition. Symptoms of hypoglycemia (sleepiness, dizziness, and disorientation)  are similar to symptoms of having too much alcohol.  If your health care provider has given you approval to drink alcohol, do so in moderation and use the following guidelines:  Women should not have more than one drink per day, and men should not have more than two drinks per day. One drink is equal to:  12 oz of beer.  5 oz of wine.  1 oz of hard liquor.  Do not drink on an empty stomach.  Keep yourself hydrated. Have water, diet soda, or unsweetened iced tea.  Regular soda, juice, and other mixers might contain a lot of carbohydrates and should be counted. WHAT FOODS ARE NOT RECOMMENDED? As you make food choices, it is important to remember that all foods are not the same. Some foods have fewer nutrients per serving than other foods, even though they might have the same number of calories or carbohydrates. It is difficult to get your body what it needs when you eat foods with fewer nutrients. Examples of foods that you should avoid that are high in calories and carbohydrates but low in nutrients include:  Trans fats (most processed foods list trans fats on the Nutrition Facts label).  Regular soda.  Juice.  Candy.  Sweets, such as cake, pie, doughnuts, and cookies.  Fried foods. WHAT FOODS CAN I EAT? Eat nutrient-rich foods, which will nourish your body and keep you healthy. The food you should eat also will depend on several factors, including:  The calories you need.  The medicines you take.  Your weight.  Your blood glucose level.  Your blood pressure level.  Your cholesterol level. You should eat a variety of foods, including:  Protein.  Lean cuts of meat.  Proteins low in saturated fats, such as fish, egg whites, and beans. Avoid processed meats.  Fruits and vegetables.  Fruits and vegetables that may help  control blood glucose levels, such as apples, mangoes, and yams.  Dairy products.  Choose fat-free or low-fat dairy products, such as milk,  yogurt, and cheese.  Grains, bread, pasta, and rice.  Choose whole grain products, such as multigrain bread, whole oats, and brown rice. These foods may help control blood pressure.  Fats.  Foods containing healthful fats, such as nuts, avocado, olive oil, canola oil, and fish. DOES EVERYONE WITH DIABETES MELLITUS HAVE THE SAME MEAL PLAN? Because every person with diabetes mellitus is different, there is not one meal plan that works for everyone. It is very important that you meet with a dietitian who will help you create a meal plan that is just right for you.   This information is not intended to replace advice given to you by your health care provider. Make sure you discuss any questions you have with your health care provider.   Document Released: 04/30/2005 Document Revised: 08/24/2014 Document Reviewed: 06/30/2013 Elsevier Interactive Patient Education 2016 [employer].  Diabetes and Exercise Exercising regularly is important. It is not just about losing weight. It has many health benefits, such as:  Improving your overall fitness, flexibility, and endurance.  Increasing your bone density.  Helping with weight control.  Decreasing your body fat.  Increasing your muscle strength.  Reducing stress and tension.  Improving your overall health. People with diabetes who exercise gain additional benefits because exercise:  Reduces appetite.  Improves the body's use of blood sugar (glucose).  Helps lower or control blood glucose.  Decreases blood pressure.  Helps control blood lipids (such as cholesterol and triglycerides).  Improves the body's use of the hormone insulin by:  Increasing the body's insulin sensitivity.  Reducing the body's insulin needs.  Decreases the risk for heart disease because exercising:  Lowers cholesterol and triglycerides levels.  Increases the levels of good cholesterol (such as high-density lipoproteins [HDL]) in the body.  Lowers  blood glucose levels. YOUR ACTIVITY PLAN  Choose an activity that you enjoy, and set realistic goals. To exercise safely, you should begin practicing any new physical activity slowly, and gradually increase the intensity of the exercise over time. Your health care provider or diabetes educator can help create an activity plan that works for you. General recommendations include:  Encouraging children to engage in at least 60 minutes of physical activity each day.  Stretching and performing strength training exercises, such as yoga or weight lifting, at least 2 times per week.  Performing a total of at least 150 minutes of moderate-intensity exercise each week, such as brisk walking or water aerobics.  Exercising at least 3 days per week, making sure you allow no more than 2 consecutive days to pass without exercising.  Avoiding long periods of inactivity (90 minutes or more). When you have to spend an extended period of time sitting down, take frequent breaks to walk or stretch. RECOMMENDATIONS FOR EXERCISING WITH TYPE 1 OR TYPE 2 DIABETES   Check your blood glucose before exercising. If blood glucose levels are greater than 240 mg/dL, check for urine ketones. Do not exercise if ketones are present.  Avoid injecting insulin into areas of the body that are going to be exercised. For example, avoid injecting insulin into:  The arms when playing tennis.  The legs when jogging.  Keep a record of:  Food intake before and after you exercise.  Expected peak times of insulin action.  Blood glucose levels before and after you exercise.  The type and amount of  exercise you have done.  Review your records with your health care provider. Your health care provider will help you to develop guidelines for adjusting food intake and insulin amounts before and after exercising.  If you take insulin or oral hypoglycemic agents, watch for signs and symptoms of hypoglycemia. They  include:  Dizziness.  Shaking.  Sweating.  Chills.  Confusion.  Drink plenty of water while you exercise to prevent dehydration or heat stroke. Body water is lost during exercise and must be replaced.  Talk to your health care provider before starting an exercise program to make sure it is safe for you. Remember, almost any type of activity is better than none.   This information is not intended to replace advice given to you by your health care provider. Make sure you discuss any questions you have with your health care provider.   Document Released: 10/24/2003 Document Revised: 12/18/2014 Document Reviewed: 01/10/2013 Elsevier Interactive Patient Education [employer]

## 2015-11-04 NOTE — ED Notes (Signed)
Pt sent here for hyperglycemia from PCP
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
## 2015-11-04 NOTE — ED Notes (Signed)
POCT CBG resulted 549; Shanda BumpsJessica, RN aware

## 2017-06-12 ENCOUNTER — Emergency Department
Admission: EM | Admit: 2017-06-12 | Discharge: 2017-06-12 | Disposition: A | Discharge status: Home / Self Care | Payer: Non-veteran care | Attending: Emergency Medicine | Admitting: Emergency Medicine

## 2017-06-12 ENCOUNTER — Encounter: Payer: Self-pay | Admitting: Emergency Medicine

## 2017-06-12 DIAGNOSIS — Z79899 Other long term (current) drug therapy: Secondary | ICD-10-CM | POA: Diagnosis not present

## 2017-06-12 DIAGNOSIS — J449 Chronic obstructive pulmonary disease, unspecified: Secondary | ICD-10-CM | POA: Diagnosis not present

## 2017-06-12 DIAGNOSIS — I10 Essential (primary) hypertension: Secondary | ICD-10-CM | POA: Diagnosis not present

## 2017-06-12 DIAGNOSIS — Z7984 Long term (current) use of oral hypoglycemic drugs: Secondary | ICD-10-CM | POA: Insufficient documentation

## 2017-06-12 DIAGNOSIS — I259 Chronic ischemic heart disease, unspecified: Secondary | ICD-10-CM | POA: Diagnosis not present

## 2017-06-12 DIAGNOSIS — F17210 Nicotine dependence, cigarettes, uncomplicated: Secondary | ICD-10-CM | POA: Insufficient documentation

## 2017-06-12 DIAGNOSIS — R42 Dizziness and giddiness: Secondary | ICD-10-CM | POA: Diagnosis present

## 2017-06-12 DIAGNOSIS — R040 Epistaxis: Secondary | ICD-10-CM

## 2017-06-12 DIAGNOSIS — E119 Type 2 diabetes mellitus without complications: Secondary | ICD-10-CM | POA: Insufficient documentation

## 2017-06-12 LAB — CBC
HCT: 48.6 % (ref 40.0–52.0)
HEMOGLOBIN: 16.6 g/dL (ref 13.0–18.0)
MCH: 32.3 pg (ref 26.0–34.0)
MCHC: 34.1 g/dL (ref 32.0–36.0)
MCV: 94.7 fL (ref 80.0–100.0)
Platelets: 155 10*3/uL (ref 150–440)
RBC: 5.13 MIL/uL (ref 4.40–5.90)
RDW: 14.4 % (ref 11.5–14.5)
WBC: 6.5 10*3/uL (ref 3.8–10.6)

## 2017-06-12 LAB — GLUCOSE, CAPILLARY: GLUCOSE-CAPILLARY: 128 mg/dL — AB (ref 65–99)

## 2017-06-12 LAB — BASIC METABOLIC PANEL WITH GFR
Anion gap: 9 (ref 5–15)
BUN: 14 mg/dL (ref 6–20)
CO2: 27 mmol/L (ref 22–32)
Calcium: 9.3 mg/dL (ref 8.9–10.3)
Chloride: 104 mmol/L (ref 101–111)
Creatinine, Ser: 0.79 mg/dL (ref 0.61–1.24)
GFR calc Af Amer: >60 mL/min
GFR calc non Af Amer: >60 mL/min
Glucose, Bld: 130 mg/dL — ABNORMAL HIGH (ref 65–99)
Potassium: 4.1 mmol/L (ref 3.5–5.1)
Sodium: 140 mmol/L (ref 135–145)

## 2017-06-12 LAB — TROPONIN I: Troponin I: 0.03 ng/mL (ref <0.03)

## 2017-06-12 MED ORDER — OXYMETAZOLINE HCL 0.05 % NA SOLN
1.0000 | Freq: Once | NASAL | Status: AC
  Administered 2017-06-12: 1 via NASAL
  Filled 2017-06-12: qty 15

## 2017-06-12 MED ORDER — WHITE PETROLATUM EX OINT
TOPICAL_OINTMENT | CUTANEOUS | Status: DC | PRN
  Filled 2017-06-12: qty 28.35

## 2017-06-12 MED ORDER — ACETAMINOPHEN 500 MG PO TABS
1000.0000 mg | ORAL_TABLET | Freq: Once | ORAL | Status: AC
  Administered 2017-06-12: 1000 mg via ORAL
  Filled 2017-06-12: qty 2

## 2017-06-12 NOTE — ED Triage Notes (Signed)
[facility].  Patient had a short nosebleed this morning that lasted about 5 min.  Patient states he had another nosebleed on Thursday night that lasted approx. 20 minutes.  Patient reports slight dizziness this morning and reports that he coughed up some mucous that had some blood in it this morning.

## 2017-06-12 NOTE — ED Provider Notes (Signed)
[facility] Emergency Department Provider Note  Time seen: 11:03 AM  I have reviewed the triage vital signs and the nursing notes.   HISTORY  Chief Complaint Epistaxis and Dizziness    HPI Robert Combs is a 71 y.o. male with a past medical history of COPD, diabetes, hypertension, presents to the emergency department for dizziness and epistaxis.  According to the patient 2 days ago he had a nosebleed.  States it stopped on its own.  Denies any history of nosebleeds in the past.  Patient states this morning he once again had a nosebleed from the left nostril, it stopped on its own but he felt somewhat dizzy so he came to the emergency department for evaluation.  Patient states over the past several weeks he has been having intermittent headaches which she has been taking Aleve for.  Patient's blood pressure 163/78, he states this is fairly good for him and is normally 180.   Past Medical History:  Diagnosis Date  . COPD (chronic obstructive pulmonary disease) (HCC)   . Coronary artery disease   . DDD (degenerative disc disease)   . Hypertension     There are no active problems to display for this patient.   History reviewed. No pertinent surgical history.  Prior to Admission medications   Medication Sig Start Date End Date Taking? Authorizing Provider  albuterol (PROVENTIL HFA;VENTOLIN HFA) 108 (90 Base) MCG/ACT inhaler Inhale 1 puff into the lungs every 6 (six) hours as needed for wheezing or shortness of breath.    [provider]  Ascorbic Acid (VITAMIN C) 100 MG tablet Take 100 mg by mouth daily.    [provider]  aspirin EC 81 MG tablet Take 81 mg by mouth daily.      [provider]  cholecalciferol (VITAMIN D) 1000 UNITS tablet Take 1,000 Units by mouth daily.      [provider]  losartan (COZAAR) 50 MG tablet Take 25 mg by mouth daily.    [provider]  metFORMIN (GLUCOPHAGE) 500 MG tablet Take  1 tablet (500 mg) PO daily x 1 week, then increase to 1 tablet PO BID thereafter 11/04/15   Street, Garrett, PA-C  omeprazole (PRILOSEC OTC) 20 MG tablet Take 20 mg by mouth daily.    [provider]    No Known Allergies  Family History  Problem Relation Age of Onset  . Coronary artery disease Mother        CABG  . Diabetes type II Mother   . Cancer Father        "brain tumors"  . Coronary artery disease Sister     Social History Social History  Substance Use Topics  . Smoking status: Current Every Day Smoker    Packs/day: 0.80    Years: 25.00    Types: Cigarettes  . Smokeless tobacco: Never Used  . Alcohol use 1.2 - 1.8 oz/week    2 - 3 Shots of liquor per week    Review of Systems Constitutional: Negative for fever. Cardiovascular: Negative for chest pain. Respiratory: Negative for shortness of breath. Gastrointestinal: Negative for abdominal pain Neurological: Intermittent headaches. All other ROS negative  ____________________________________________   PHYSICAL EXAM:  VITAL SIGNS: ED Triage Vitals [06/12/17 0940]  Enc Vitals Group     BP (!) 163/78     Pulse Rate (!) 53     Resp 20     Temp 98 F (36.7 C)     Temp Source  Oral     SpO2 96 %     Weight      Height      Head Circumference      Peak Flow      Pain Score      Pain Loc      Pain Edu?      Excl. in GC?     Constitutional: Alert and oriented. Well appearing and in no distress. Eyes: Normal exam ENT   Head: Normocephalic and atraumatic.   Mouth/Throat: Mucous membranes are moist. Cardiovascular: Normal rate, regular rhythm.  Respiratory: Normal respiratory effort without tachypnea nor retractions. Breath sounds are clear  Gastrointestinal: Soft and nontender. No distention. Musculoskeletal: Nontender with normal range of motion in all extremities. Neurologic:  Normal speech and language. No gross focal neurologic deficits Skin:  Skin is warm, dry and intact.   Psychiatric: Mood and affect are normal.   ____________________________________________    EKG  EKG reviewed and interpreted by myself shows sinus bradycardia 55 bpm with a narrow QRS, normal axis, normal intervals, nonspecific but no concerning ST changes.  Patient does have slight T wave changes compared to prior EKG of 2012.  ____________________________________________   INITIAL IMPRESSION / ASSESSMENT AND PLAN / ED COURSE  Pertinent labs & imaging results that were available during my care of the patient were reviewed by me and considered in my medical decision making (see chart for details).  Patient presents to the emergency department for epistaxis dizziness and intermittent headaches.  No epistaxis currently.  Differential would include epistaxis, left slight abnormality, anemia, headache.  We will treat the patient's headache with Tylenol.  I had a discussion with the patient regarding headache management and the use of Tylenol opposed to Aleve in the setting of epistaxis.  I discussed with the patient and he [facility].  Patient has dried blood in his left nostril, no other apparent abnormalities.  We will use Afrin and discharge for the same.  Patient is hypertensive 163 systolic which could be contributing to his epistaxis.  Patient states his blood pressure is actually very good for him and he will follow-up with his doctor regarding BP management.  Patient's labs are largely within normal limits, glucose is 130 on chemistry.  H&H are normal.  Given the patient's slight T wave changes in the inferolateral leads compared to 2012 we will obtain a cardiac enzyme.  His lungs a cardiac enzyme is normal I believe the patient would be safe for discharge home with PCP follow-up.  Patient agreeable to this plan.  Overall he appears extremely well, no distress.  Patient's troponin negative.  Remains hemostatic.  We will discharge  home with a ENT follow-up if needed for any further epistaxis  ____________________________________________   FINAL CLINICAL IMPRESSION(S) / ED DIAGNOSES  Epistaxis Dizziness    Minna AntisPaduchowski, Alayha Babineaux, MD 06/12/17 1235

## 2018-08-02 ENCOUNTER — Other Ambulatory Visit: Payer: Self-pay

## 2018-08-02 ENCOUNTER — Emergency Department: Payer: Non-veteran care

## 2018-08-02 ENCOUNTER — Encounter: Payer: Self-pay | Admitting: Emergency Medicine

## 2018-08-02 ENCOUNTER — Emergency Department
Admission: EM | Admit: 2018-08-02 | Discharge: 2018-08-02 | Disposition: A | Discharge status: Home / Self Care | Payer: Non-veteran care | Attending: Emergency Medicine | Admitting: Emergency Medicine

## 2018-08-02 DIAGNOSIS — J449 Chronic obstructive pulmonary disease, unspecified: Secondary | ICD-10-CM | POA: Diagnosis not present

## 2018-08-02 DIAGNOSIS — F17210 Nicotine dependence, cigarettes, uncomplicated: Secondary | ICD-10-CM | POA: Diagnosis not present

## 2018-08-02 DIAGNOSIS — Z7982 Long term (current) use of aspirin: Secondary | ICD-10-CM | POA: Insufficient documentation

## 2018-08-02 DIAGNOSIS — Z7984 Long term (current) use of oral hypoglycemic drugs: Secondary | ICD-10-CM | POA: Diagnosis not present

## 2018-08-02 DIAGNOSIS — S80812A Abrasion, left lower leg, initial encounter: Secondary | ICD-10-CM | POA: Diagnosis not present

## 2018-08-02 DIAGNOSIS — Y92410 Unspecified street and highway as the place of occurrence of the external cause: Secondary | ICD-10-CM | POA: Insufficient documentation

## 2018-08-02 DIAGNOSIS — I2510 Atherosclerotic heart disease of native coronary artery without angina pectoris: Secondary | ICD-10-CM | POA: Diagnosis not present

## 2018-08-02 DIAGNOSIS — M25531 Pain in right wrist: Secondary | ICD-10-CM | POA: Diagnosis not present

## 2018-08-02 DIAGNOSIS — Z79899 Other long term (current) drug therapy: Secondary | ICD-10-CM | POA: Diagnosis not present

## 2018-08-02 DIAGNOSIS — S6991XA Unspecified injury of right wrist, hand and finger(s), initial encounter: Secondary | ICD-10-CM | POA: Diagnosis present

## 2018-08-02 DIAGNOSIS — I10 Essential (primary) hypertension: Secondary | ICD-10-CM | POA: Diagnosis not present

## 2018-08-02 DIAGNOSIS — Y9389 Activity, other specified: Secondary | ICD-10-CM | POA: Insufficient documentation

## 2018-08-02 DIAGNOSIS — Y999 Unspecified external cause status: Secondary | ICD-10-CM | POA: Insufficient documentation

## 2018-08-02 DIAGNOSIS — R51 Headache: Secondary | ICD-10-CM | POA: Diagnosis not present

## 2018-08-02 DIAGNOSIS — S60511A Abrasion of right hand, initial encounter: Secondary | ICD-10-CM | POA: Insufficient documentation

## 2018-08-02 MED ORDER — TRAMADOL HCL 50 MG PO TABS
50.0000 mg | ORAL_TABLET | Freq: Once | ORAL | Status: AC
  Administered 2018-08-02: 50 mg via ORAL
  Filled 2018-08-02: qty 1

## 2018-08-02 MED ORDER — TRAMADOL HCL 50 MG PO TABS: 50.0000 mg | ORAL_TABLET | Freq: Four times a day (QID) | ORAL | 0 refills | Status: DC | PRN
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
## 2019-02-23 ENCOUNTER — Other Ambulatory Visit: Payer: Self-pay | Admitting: Family Medicine

## 2019-02-23 DIAGNOSIS — Z20822 Contact with and (suspected) exposure to covid-19: Secondary | ICD-10-CM

## 2019-02-28 LAB — NOVEL CORONAVIRUS, NAA: SARS-CoV-2, NAA: NOT DETECTED

## 2020-07-15 ENCOUNTER — Other Ambulatory Visit: Payer: Self-pay

## 2020-07-15 ENCOUNTER — Emergency Department: Payer: No Typology Code available for payment source

## 2020-07-15 ENCOUNTER — Emergency Department
Admission: EM | Admit: 2020-07-15 | Discharge: 2020-07-15 | Disposition: A | Discharge status: Home / Self Care | Payer: No Typology Code available for payment source | Attending: Emergency Medicine | Admitting: Emergency Medicine

## 2020-07-15 ENCOUNTER — Encounter: Payer: Self-pay | Admitting: Intensive Care

## 2020-07-15 DIAGNOSIS — J449 Chronic obstructive pulmonary disease, unspecified: Secondary | ICD-10-CM | POA: Diagnosis not present

## 2020-07-15 DIAGNOSIS — R054 Cough syncope: Secondary | ICD-10-CM | POA: Diagnosis not present

## 2020-07-15 DIAGNOSIS — Z79899 Other long term (current) drug therapy: Secondary | ICD-10-CM | POA: Insufficient documentation

## 2020-07-15 DIAGNOSIS — Z7982 Long term (current) use of aspirin: Secondary | ICD-10-CM | POA: Insufficient documentation

## 2020-07-15 DIAGNOSIS — R55 Syncope and collapse: Secondary | ICD-10-CM | POA: Insufficient documentation

## 2020-07-15 DIAGNOSIS — Z87891 Personal history of nicotine dependence: Secondary | ICD-10-CM | POA: Insufficient documentation

## 2020-07-15 DIAGNOSIS — I2510 Atherosclerotic heart disease of native coronary artery without angina pectoris: Secondary | ICD-10-CM | POA: Diagnosis not present

## 2020-07-15 DIAGNOSIS — I10 Essential (primary) hypertension: Secondary | ICD-10-CM | POA: Insufficient documentation

## 2020-07-15 LAB — CBC
HCT: 43.8 % (ref 39.0–52.0)
Hemoglobin: 15.1 g/dL (ref 13.0–17.0)
MCH: 32.7 pg (ref 26.0–34.0)
MCHC: 34.5 g/dL (ref 30.0–36.0)
MCV: 94.8 fL (ref 80.0–100.0)
Platelets: 183 10*3/uL (ref 150–400)
RBC: 4.62 MIL/uL (ref 4.22–5.81)
RDW: 12.4 % (ref 11.5–15.5)
WBC: 8.4 10*3/uL (ref 4.0–10.5)
nRBC: 0 % (ref 0.0–0.2)

## 2020-07-15 LAB — BASIC METABOLIC PANEL
Anion gap: 10 (ref 5–15)
BUN: 16 mg/dL (ref 8–23)
CO2: 26 mmol/L (ref 22–32)
Calcium: 9.5 mg/dL (ref 8.9–10.3)
Chloride: 96 mmol/L — ABNORMAL LOW (ref 98–111)
Creatinine, Ser: 0.99 mg/dL (ref 0.61–1.24)
GFR, Estimated: >60 mL/min (ref >60)
Glucose, Bld: 255 mg/dL — ABNORMAL HIGH (ref 70–99)
Potassium: 3.9 mmol/L (ref 3.5–5.1)
Sodium: 132 mmol/L — ABNORMAL LOW (ref 135–145)

## 2020-07-15 LAB — TROPONIN I (HIGH SENSITIVITY)
Troponin I (High Sensitivity): 20 ng/L — ABNORMAL HIGH (ref <18)
Troponin I (High Sensitivity): 17 ng/L (ref <18)

## 2020-07-15 MED ORDER — BENZONATATE 100 MG PO CAPS: 100.0000 mg | ORAL_CAPSULE | Freq: Four times a day (QID) | ORAL | 0 refills | Status: AC | PRN

## 2020-07-15 NOTE — Discharge Instructions (Addendum)
Please seek medical attention for any high fevers, chest pain, shortness of breath, change in behavior, persistent vomiting, bloody stool or any other new or concerning symptoms.  

## 2020-07-15 NOTE — ED Provider Notes (Signed)
[facility] Emergency Department Provider Note   ____________________________________________   I have reviewed the triage vital signs and the nursing notes.   HISTORY  Chief Complaint Loss of Consciousness and Cough   History limited by: Not Limited   HPI Robert Combs is a 74 y.o. male who presents to the emergency department today because of concerns for episodes of syncope that occur when the patient coughs. He states that he has had this happen to him over the past 10 years where he will cough and then passed out. He states for roughly the past week and a half the episodes are more frequently. He went to urgent care as well as his doctor at the [place] about increased cough and shortness of breath. States he was put on steroids as well as guaifenesin and antibiotics for his symptoms. Does have a long history of smoking although has now quit. He states that over the past 24 hours he has had multiple episodes where he will cough and then passed out. He only passes out when he coughs.    Records reviewed. Per medical record review patient has a history of COPD, CAD.  Past Medical History:  Diagnosis Date  . COPD (chronic obstructive pulmonary disease) (HCC)   . Coronary artery disease   . DDD (degenerative disc disease)   . Hypertension     Prior to Admission medications   Medication Sig Start Date End Date Taking? Authorizing Provider  albuterol (PROVENTIL HFA;VENTOLIN HFA) 108 (90 Base) MCG/ACT inhaler Inhale 1 puff into the lungs every 6 (six) hours as needed for wheezing or shortness of breath.    [provider]  Ascorbic Acid (VITAMIN C) 100 MG tablet Take 100 mg by mouth daily.    [provider]  aspirin EC 81 MG tablet Take 81 mg by mouth daily.      [provider]  cholecalciferol (VITAMIN D) 1000 UNITS tablet Take 1,000 Units by mouth daily.      [provider]  losartan (COZAAR) 50 MG tablet Take 25 mg by  mouth daily.    [provider]  metFORMIN (GLUCOPHAGE) 500 MG tablet Take 1 tablet (500 mg) PO daily x 1 week, then increase to 1 tablet PO BID thereafter 11/04/15   Street, [place], PA-C  omeprazole (PRILOSEC OTC) 20 MG tablet Take 20 mg by mouth daily.    [provider]  traMADol (ULTRAM) 50 MG tablet Take 1 tablet (50 mg total) by mouth every 6 (six) hours as needed. 08/02/18   Minna Antis, MD    Allergies Patient has no known allergies.  Family History  Problem Relation Age of Onset  . Coronary artery disease Mother        CABG  . Diabetes type II Mother   . Cancer Father        "brain tumors"  . Coronary artery disease Sister     Social History Social History   Tobacco Use  . Smoking status: Former Smoker    Packs/day: 0.80    Years: 25.00    Pack years: 20.00    Types: Cigarettes    Quit date: 07/11/2020    Years since quitting: 0.0  . Smokeless tobacco: Never Used  Substance Use Topics  . Alcohol use: Yes    Alcohol/week: 2.0 - 3.0 standard drinks    Types: 2 - 3 Shots of liquor per week  . Drug use: No    Review of Systems Constitutional: No  fever/chills Eyes: No visual changes. ENT: No sore throat. Cardiovascular: Denies chest pain. Respiratory: Positive for cough. Gastrointestinal: No abdominal pain.  No nausea, no vomiting.  No diarrhea.   Genitourinary: Negative for dysuria. Musculoskeletal: Negative for back pain. Skin: Negative for rash. Neurological: Positive for syncope. ____________________________________________   PHYSICAL EXAM:  VITAL SIGNS: ED Triage Vitals  Enc Vitals Group     BP 07/15/20 1352 (!) 127/58     Pulse Rate 07/15/20 1352 (!) 53     Resp 07/15/20 1352 18     Temp 07/15/20 1352 98.3 F (36.8 C)     Temp Source 07/15/20 1352 Oral     SpO2 07/15/20 1352 95 %     Weight 07/15/20 1353 219 lb (99.3 kg)     Height 07/15/20 1353 5' 9.5" (1.765 m)     Head Circumference --      Peak Flow --       Pain Score 07/15/20 1352 0   Constitutional: Alert and oriented.  Eyes: Conjunctivae are normal.  ENT      Head: Normocephalic and atraumatic.      Nose: No congestion/rhinnorhea.      Mouth/Throat: Mucous membranes are moist.      Neck: No stridor. Hematological/Lymphatic/Immunilogical: No cervical lymphadenopathy. Cardiovascular: Normal rate, regular rhythm.  No murmurs, rubs, or gallops.  Respiratory: Occasional dry cough. Slight wheezing diffusely.  Gastrointestinal: Soft and non tender. No rebound. No guarding.  Genitourinary: Deferred Musculoskeletal: Normal range of motion in all extremities. No lower extremity edema. Neurologic:  Normal speech and language. No gross focal neurologic deficits are appreciated.  Skin:  Skin is warm, dry and intact. No rash noted. Psychiatric: Mood and affect are normal. Speech and behavior are normal. Patient exhibits appropriate insight and judgment.  ____________________________________________    LABS (pertinent positives/negatives)  Trop hs 20 BMP na 132, k 3.9, glu 255, cr 0.99 CBC wbc 8.4, hgb 15.1, plt 183  ____________________________________________   EKG  I, Phineas Semen, attending physician, personally viewed and interpreted this EKG  EKG Time: 1350 Rate: 54 Rhythm: sinus bradycardia with pac Axis: normal Intervals: qtc 375 QRS: incomplete RBBB ST changes: no st elevation Impression: abnormal ekg  ____________________________________________    RADIOLOGY  CXR Focal opacity in right lung base concern for atelectasis vs airspace disease  ____________________________________________   PROCEDURES  Procedures  ____________________________________________   INITIAL IMPRESSION / ASSESSMENT AND PLAN / ED COURSE  Pertinent labs & imaging results that were available during my care of the patient were reviewed by me and considered in my medical decision making (see chart for details).   Patient presented to  the emergency department today with primary concern for episodes of syncope that occur with coughing. All the symptoms have been present for roughly 10 years they have occurred more frequently recently as he has had more of a cough. This point do wonder if the patient has almost a vasovagal-like episodes with his coughing. Given that it only happens with his coughing I have lower suspicion for concerning arrhythmia or cardiac disease. Patient's x-ray here showed possible infection versus atelectasis. Patient without fever or leukocytosis. I doubt infection at this time. Did discuss concerns with patient. Will plan on discharging with cough suppressant. Discussed with patient importance of following up with his doctor at the [place].  ____________________________________________   FINAL CLINICAL IMPRESSION(S) / ED DIAGNOSES  Final diagnoses:  Cough syncope     Note: This dictation was prepared with Dragon dictation. Any transcriptional errors  that result from this process are unintentional     Phineas Semen, MD 07/15/20 2041

## 2020-07-15 NOTE — ED Triage Notes (Addendum)
Patient c/o chest cold with cough and multiple episodes of LOC today. Reports he was given steroids, breathing treatments, and antibiotics Saturday with little relief. HE reports "when I go to cough I feel like I have no control over my body." Patient reports driving himself to ER today. Able to ambulate to triage with no problems
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
## 2021-01-24 ENCOUNTER — Encounter: Payer: Self-pay | Admitting: *Deleted

## 2021-01-24 ENCOUNTER — Encounter: Payer: No Typology Code available for payment source | Attending: Pulmonary Disease | Admitting: *Deleted

## 2021-01-24 ENCOUNTER — Other Ambulatory Visit: Payer: Self-pay

## 2021-01-24 DIAGNOSIS — J449 Chronic obstructive pulmonary disease, unspecified: Secondary | ICD-10-CM | POA: Insufficient documentation

## 2021-01-24 NOTE — Progress Notes (Signed)
Virtual orientation call completed today. he has an appointment on Date: 02/03/2021  for EP eval and gym Orientation.  Documentation of diagnosis can be found in Media Tab Date: 02/03/2021 adding [facility] notees to Media Tab .

## 2021-02-03 ENCOUNTER — Encounter: Payer: Self-pay | Admitting: *Deleted

## 2021-02-03 ENCOUNTER — Encounter: Payer: No Typology Code available for payment source | Admitting: *Deleted

## 2021-02-03 ENCOUNTER — Other Ambulatory Visit: Payer: Self-pay

## 2021-02-03 VITALS — Ht 70.1 in | Wt 213.6 lb

## 2021-02-03 DIAGNOSIS — J449 Chronic obstructive pulmonary disease, unspecified: Secondary | ICD-10-CM

## 2021-02-03 NOTE — Patient Instructions (Signed)
Patient Instructions  Patient Details  Name: Robert Combs MRN: [id number] Date of Birth: Mar 20, 1946 Referring Provider:  Barbaraann Share, MD  Below are your personal goals for exercise, nutrition, and risk factors. Our goal is to help you stay on track towards obtaining and maintaining these goals. We will be discussing your progress on these goals with you throughout the program.  Initial Exercise Prescription:  Initial Exercise Prescription - 02/03/21 1300       Date of Initial Exercise RX and Referring Provider   Date 02/03/21    Referring Provider Marcelyn Bruins MD   [facility]     Treadmill   MPH 2.2    Grade 0.5    Minutes 15    METs 2.84      Recumbant Bike   Level 2    RPM 50    Watts 22    Minutes 15    METs 2.6      Biostep-RELP   Level 2    SPM 50    Minutes 15    METs 2      Track   Laps 30    Minutes 15    METs 2.6      Prescription Details   Frequency (times per week) 2    Duration Progress to 30 minutes of continuous aerobic without signs/symptoms of physical distress      Intensity   THRR 40-80% of Max Heartrate 95-130    Ratings of Perceived Exertion 11-13    Perceived Dyspnea 0-4      Progression   Progression Continue to progress workloads to maintain intensity without signs/symptoms of physical distress.      Resistance Training   Training Prescription Yes    Weight 4 lb    Reps 10-15             Exercise Goals: Frequency: Be able to perform aerobic exercise two to three times per week in program working toward 2-5 days per week of home exercise.  Intensity: Work with a perceived exertion of 11 (fairly light) - 15 (hard) while following your exercise prescription.  We will make changes to your prescription with you as you progress through the program.   Duration: Be able to do 30 to 45 minutes of continuous aerobic exercise in addition to a 5 minute warm-up and a 5 minute cool-down routine.   Nutrition Goals: Your  personal nutrition goals will be established when you do your nutrition analysis with the dietician.  The following are general nutrition guidelines to follow: Cholesterol < 200mg /day Sodium < 1500mg /day Fiber: Men over 50 yrs - 30 grams per day  Personal Goals:  Personal Goals and Risk Factors at Admission - 02/03/21 1323       Core Components/Risk Factors/Patient Goals on Admission    Weight Management Yes;Weight Maintenance;Obesity    Intervention Weight Management: Provide education and appropriate resources to help participant work on and attain dietary goals.;Weight Management: Develop a combined nutrition and exercise program designed to reach desired caloric intake, while maintaining appropriate intake of nutrient and fiber, sodium and fats, and appropriate energy expenditure required for the weight goal.;Weight Management/Obesity: Establish reasonable short term and long term weight goals.;Obesity: Provide education and appropriate resources to help participant work on and attain dietary goals.    Admit Weight 213 lb 9.6 oz (96.9 kg)    Goal Weight: Short Term 208 lb (94.3 kg)    Goal Weight: Long Term 200 lb (90.7 kg)  Expected Outcomes Short Term: Continue to assess and modify interventions until short term weight is achieved;Long Term: Adherence to nutrition and physical activity/exercise program aimed toward attainment of established weight goal;Weight Loss: Understanding of general recommendations for a balanced deficit meal plan, which promotes 1-2 lb weight loss per week and includes a negative energy balance of [phone] kcal/d;Understanding recommendations for meals to include 15-35% energy as protein, 25-35% energy from fat, 35-60% energy from carbohydrates, less than 200mg  of dietary cholesterol, 20-35 gm of total fiber daily;Understanding of distribution of calorie intake throughout the day with the consumption of 4-5 meals/snacks    Tobacco Cessation Yes    Number of packs  per day 1 per day    Intervention Assist the participant in steps to quit. Provide individualized education and counseling about committing to Tobacco Cessation, relapse prevention, and pharmacological support that can be provided by physician.;Education officer, environmentalffer self-teaching materials, assist with locating and accessing local/national Quit Smoking programs, and support quit date choice.    Expected Outcomes Short Term: Will demonstrate readiness to quit, by selecting a quit date.;Short Term: Will quit all tobacco product use, adhering to prevention of relapse plan.;Long Term: Complete abstinence from all tobacco products for at least 12 months from quit date.    Improve shortness of breath with ADL's Yes    Intervention Provide education, individualized exercise plan and daily activity instruction to help decrease symptoms of SOB with activities of daily living.    Expected Outcomes Short Term: Improve cardiorespiratory fitness to achieve a reduction of symptoms when performing ADLs;Long Term: Be able to perform more ADLs without symptoms or delay the onset of symptoms    Increase knowledge of respiratory medications and ability to use respiratory devices properly  Yes    Intervention Provide education and demonstration as needed of appropriate use of medications, inhalers, and oxygen therapy.    Expected Outcomes Short Term: Achieves understanding of medications use. Understands that oxygen is a medication prescribed by physician. Demonstrates appropriate use of inhaler and oxygen therapy.;Long Term: Maintain appropriate use of medications, inhalers, and oxygen therapy.    Diabetes Yes    Intervention Provide education about signs/symptoms and action to take for hypo/hyperglycemia.;Provide education about proper nutrition, including hydration, and aerobic/resistive exercise prescription along with prescribed medications to achieve blood glucose in normal ranges: Fasting glucose 65-99 mg/dL    Expected Outcomes  Short Term: Participant verbalizes understanding of the signs/symptoms and immediate care of hyper/hypoglycemia, proper foot care and importance of medication, aerobic/resistive exercise and nutrition plan for blood glucose control.;Long Term: Attainment of HbA1C < 7%.    Hypertension Yes    Intervention Provide education on lifestyle modifcations including regular physical activity/exercise, weight management, moderate sodium restriction and increased consumption of fresh fruit, vegetables, and low fat dairy, alcohol moderation, and smoking cessation.;Monitor prescription use compliance.    Expected Outcomes Short Term: Continued assessment and intervention until BP is < 140/1490mm HG in hypertensive participants. < 130/1380mm HG in hypertensive participants with diabetes, heart failure or chronic kidney disease.;Long Term: Maintenance of blood pressure at goal levels.    Lipids Yes    Intervention Provide education and support for participant on nutrition & aerobic/resistive exercise along with prescribed medications to achieve LDL 70mg , HDL >40mg .    Expected Outcomes Short Term: Participant states understanding of desired cholesterol values and is compliant with medications prescribed. Participant is following exercise prescription and nutrition guidelines.;Long Term: Cholesterol controlled with medications as prescribed, with individualized exercise RX and with personalized nutrition plan. Value  goals: LDL < 70mg , HDL > 40 mg.             Tobacco Use Initial Evaluation: Social History   Tobacco Use  Smoking Status Every Day   Packs/day: 1.00   Years: 25.00   Pack years: 25.00   Types: Cigarettes   Last attempt to quit: 07/11/2020   Years since quitting: 0.5  Smokeless Tobacco Never  Tobacco Comments   02/03/21 currently at [facility] ppd, [facility]    Exercise Goals and Review:  Exercise Goals     Row Name 02/03/21 1319             Exercise Goals    Increase Physical Activity Yes       Intervention Provide advice, education, support and counseling about physical activity/exercise needs.;Develop an individualized exercise prescription for aerobic and resistive training based on initial evaluation findings, risk stratification, comorbidities and participant's personal goals.       Expected Outcomes Short Term: Attend rehab on a regular basis to increase amount of physical activity.;Long Term: Add in home exercise to make exercise part of routine and to increase amount of physical activity.;Long Term: Exercising regularly at least 3-5 days a week.       Increase Strength and Stamina Yes       Intervention Provide advice, education, support and counseling about physical activity/exercise needs.;Develop an individualized exercise prescription for aerobic and resistive training based on initial evaluation findings, risk stratification, comorbidities and participant's personal goals.       Expected Outcomes Short Term: Increase workloads from initial exercise prescription for resistance, speed, and METs.;Short Term: Perform resistance training exercises routinely during rehab and add in resistance training at home;Long Term: Improve cardiorespiratory fitness, muscular endurance and strength as measured by increased METs and functional capacity (02/05/21)       Able to understand and use rate of perceived exertion (RPE) scale Yes       Intervention Provide education and explanation on how to use RPE scale       Expected Outcomes Short Term: Able to use RPE daily in rehab to express subjective intensity level;Long Term:  Able to use RPE to guide intensity level when exercising independently       Able to understand and use Dyspnea scale Yes       Intervention Provide education and explanation on how to use Dyspnea scale       Expected Outcomes Short Term: Able to use Dyspnea scale daily in rehab to express subjective sense of shortness of breath during  exertion;Long Term: Able to use Dyspnea scale to guide intensity level when exercising independently       Knowledge and understanding of Target Heart Rate Range (THRR) Yes       Intervention Provide education and explanation of THRR including how the numbers were predicted and where they are located for reference       Expected Outcomes Short Term: Able to use daily as guideline for intensity in rehab;Short Term: Able to state/look up THRR;Long Term: Able to use THRR to govern intensity when exercising independently       Able to check pulse independently Yes       Intervention Provide education and demonstration on how to check pulse in carotid and radial arteries.;Review the importance of being able to check your own pulse for safety during independent exercise       Expected Outcomes Short Term: Able to explain  why pulse checking is important during independent exercise;Long Term: Able to check pulse independently and accurately       Understanding of Exercise Prescription Yes       Intervention Provide education, explanation, and written materials on patient's individual exercise prescription       Expected Outcomes Short Term: Able to explain program exercise prescription;Long Term: Able to explain home exercise prescription to exercise independently                Copy of goals given to participant.

## 2021-02-03 NOTE — Progress Notes (Signed)
Pulmonary Individual Treatment Plan  Patient Details  Name: Robert Combs MRN: [id number] Date of Birth: [birth year] Referring Provider:   Doristine DevoidFlowsheet Row [facility]monary [facility]  Referring Provider Marcelyn Bruinslance, Keith MD  [name]       Initial Encounter Date:  Flowsheet Row [facility]monary [facility]  Date 02/03/21       Visit Diagnosis: Chronic obstructive pulmonary disease, unspecified COPD type (HCC)  Patient's Home Medications on Admission:  Current Outpatient Medications:    albuterol (PROVENTIL HFA;VENTOLIN HFA) 108 (90 Base) MCG/ACT inhaler, Inhale 1 puff into the lungs every 6 (six) hours as needed for wheezing or shortness of breath., Disp: , Rfl:    albuterol (VENTOLIN HFA) 108 (90 Base) MCG/ACT inhaler, Inhale into the lungs., Disp: , Rfl:    Alogliptin Benzoate 25 MG TABS, Take by mouth., Disp: , Rfl:    Ascorbic Acid (VITAMIN C) 100 MG tablet, Take 100 mg by mouth daily. (Patient not taking: Reported on 01/24/2021), Disp: , Rfl:    aspirin EC 81 MG tablet, Take 81 mg by mouth daily.  , Disp: , Rfl:    benzonatate (TESSALON PERLES) 100 MG capsule, Take 1 capsule (100 mg total) by mouth every 6 (six) hours as needed for cough. (Patient not taking: Reported on 01/24/2021), Disp: 30 capsule, Rfl: 0   budesonide-formoterol (SYMBICORT) 160-4.5 MCG/ACT inhaler, Inhale into the lungs., Disp: , Rfl:    cholecalciferol (VITAMIN D) 1000 UNITS tablet, Take 1,000 Units by mouth daily.  , Disp: , Rfl:    losartan (COZAAR) 50 MG tablet, Take 25 mg by mouth daily., Disp: , Rfl:    metFORMIN (GLUCOPHAGE) 500 MG tablet, Take 1 tablet (500 mg) PO daily x 1 week, then increase to 1 tablet PO BID thereafter, Disp: 60 tablet, Rfl: 0   mometasone (ASMANEX) 220 MCG/INH inhaler, Take by mouth., Disp: , Rfl:    omeprazole (PRILOSEC OTC) 20 MG tablet, Take 20 mg by mouth daily., Disp: , Rfl:    traMADol (ULTRAM)  50 MG tablet, Take 1 tablet (50 mg total) by mouth every 6 (six) hours as needed., Disp: 10 tablet, Rfl: 0  Past Medical History: Past Medical History:  Diagnosis Date   COPD (chronic obstructive pulmonary disease) (HCC)    Coronary artery disease    DDD (degenerative disc disease)    Hypertension     Tobacco Use: Social History   Tobacco Use  Smoking Status Every Day   Packs/day: 1.00   Years: 25.00   Pack years: 25.00   Types: Cigarettes   Last attempt to quit: 07/11/2020   Years since quitting: 0.5  Smokeless Tobacco Never  Tobacco Comments   02/03/21 currently at 1 ppd, [facility]    Labs: Recent Review Flowsheet Data     Labs for ITP Cardiac and Pulmonary Rehab Latest Ref Rng & Units 02/03/2010 11/04/2015   HCO3 20.0 - 24.0 mEq/L - 26.3(H)   TCO2 0 - 100 mmol/L 31 28   O2SAT % - 75.0        Pulmonary Assessment Scores:  Pulmonary Assessment Scores     Row Name 02/03/21 1328         ADL UCSD   ADL Phase Entry     SOB Score total 57     Rest 3     Walk 4     Stairs 5  Bath 0     Dress 0     Shop 2           CAT Score     CAT Score 37           mMRC Score     mMRC Score 0             UCSD: Self-administered rating of dyspnea associated with activities of daily living (ADLs) 6-point scale (0 = "not at all" to 5 = "maximal or unable to do because of breathlessness")  Scoring Scores range from 0 to 120.  Minimally important difference is 5 units  CAT: CAT can identify the health impairment of COPD patients and is better correlated with disease progression.  CAT has a scoring range of zero to 40. The CAT score is classified into four groups of low (less than 10), medium (10 - 20), high (21-30) and very high (31-40) based on the impact level of disease on health status. A CAT score over 10 suggests significant symptoms.  A worsening CAT score could be explained by an exacerbation, poor medication adherence,  poor inhaler technique, or progression of COPD or comorbid conditions.  CAT MCID is 2 points  mMRC: mMRC (Modified Medical Research Council) Dyspnea Scale is used to assess the degree of baseline functional disability in patients of respiratory disease due to dyspnea. No minimal important difference is established. A decrease in score of 1 point or greater is considered a positive change.   Pulmonary Function Assessment:  Pulmonary Function Assessment - 02/03/21 1327       Breath   Shortness of Breath Yes;Fear of Shortness of Breath;Limiting activity             Exercise Target Goals: Exercise Program Goal: Individual exercise prescription set using results from initial 6 min walk test and THRR while considering  patient's activity barriers and safety.   Exercise Prescription Goal: Initial exercise prescription builds to 30-45 minutes a day of aerobic activity, 2-3 days per week.  Home exercise guidelines will be given to patient during program as part of exercise prescription that the participant will acknowledge.  Education: Aerobic Exercise: - Group verbal and visual presentation on the components of exercise prescription. Introduces F.I.T.T principle from ACSM for exercise prescriptions.  Reviews F.I.T.T. principles of aerobic exercise including progression. Written material given at graduation.   Education: Resistance Exercise: - Group verbal and visual presentation on the components of exercise prescription. Introduces F.I.T.T principle from ACSM for exercise prescriptions  Reviews F.I.T.T. principles of resistance exercise including progression. Written material given at graduation.    Education: Exercise & Equipment Safety: - Individual verbal instruction and demonstration of equipment use and safety with use of the equipment. Flowsheet Row Pulmonary Rehab from 02/03/2021 in [facility] Cardiac and Pulmonary Rehab  Date 02/03/21  Educator [facility]  Instruction Review Code 1-  Verbalizes Understanding       Education: Exercise Physiology & General Exercise Guidelines: - Group verbal and written instruction with models to review the exercise physiology of the cardiovascular system and associated critical values. Provides general exercise guidelines with specific guidelines to those with heart or lung disease.    Education: Flexibility, Balance, Mind/Body Relaxation: - Group verbal and visual presentation with interactive activity on the components of exercise prescription. Introduces F.I.T.T principle from ACSM for exercise prescriptions. Reviews F.I.T.T. principles of flexibility and balance exercise training including progression. Also discusses the mind body connection.  Reviews various relaxation techniques to help reduce and  manage stress (i.e. Deep breathing, progressive muscle relaxation, and visualization). Balance handout provided to take home. Written material given at graduation.   Activity Barriers & Risk Stratification:  Activity Barriers & Cardiac Risk Stratification - 02/03/21 1154       Activity Barriers & Cardiac Risk Stratification   Activity Barriers Shortness of Breath;Joint Problems;Deconditioning;Muscular Weakness;Balance Concerns             6 Minute Walk:  6 Minute Walk     Row Name 02/03/21 1152         6 Minute Walk   Phase Initial     Distance 1380 feet     Walk Time 6 minutes     # of Rest Breaks 0     MPH 2.61     METS 2.72     RPE 11     Perceived Dyspnea  3     VO2 Peak 9.51     Symptoms Yes (comment)     Comments legs burning, SOB     Resting HR 60 bpm     Resting BP 122/62     Resting Oxygen Saturation  94 %     Exercise Oxygen Saturation  during 6 min walk 91 %     Max Ex. HR 83 bpm     Max Ex. BP 156/74     2 Minute Post BP 132/70           Interval HR     1 Minute HR 59     2 Minute HR 66     3 Minute HR 67     4 Minute HR 82     5 Minute HR 83     6 Minute HR 70     2 Minute Post HR 65      Interval Heart Rate? Yes           Interval Oxygen     Interval Oxygen? Yes     Baseline Oxygen Saturation % 94 %     1 Minute Oxygen Saturation % 93 %     1 Minute Liters of Oxygen 0 L  Room Air     2 Minute Oxygen Saturation % 92 %     2 Minute Liters of Oxygen 0 L     3 Minute Oxygen Saturation % 91 %     3 Minute Liters of Oxygen 0 L     4 Minute Oxygen Saturation % 92 %     4 Minute Liters of Oxygen 0 L     5 Minute Oxygen Saturation % 91 %     5 Minute Liters of Oxygen 0 L     6 Minute Oxygen Saturation % 91 %     6 Minute Liters of Oxygen 0 L     2 Minute Post Oxygen Saturation % 96 %     2 Minute Post Liters of Oxygen 0 L            Oxygen Initial Assessment:  Oxygen Initial Assessment - 02/03/21 1327       Home Oxygen   Home Oxygen Device None    Sleep Oxygen Prescription None    Home Exercise Oxygen Prescription None    Home Resting Oxygen Prescription None      Initial 6 min Walk   Oxygen Used None      Program Oxygen Prescription   Program Oxygen Prescription None      Intervention   Short  Term Goals To learn and demonstrate proper pursed lip breathing techniques or other breathing techniques. ;To learn and demonstrate proper use of respiratory medications;To learn and understand importance of maintaining oxygen saturations>88%;To learn and understand importance of monitoring SPO2 with pulse oximeter and demonstrate accurate use of the pulse oximeter.    Long  Term Goals Exhibits proper breathing techniques, such as pursed lip breathing or other method taught during program session;Compliance with respiratory medication;Maintenance of O2 saturations>88%;Verbalizes importance of monitoring SPO2 with pulse oximeter and return demonstration             Oxygen Re-Evaluation:   Oxygen Discharge (Final Oxygen Re-Evaluation):   Initial Exercise Prescription:  Initial Exercise Prescription - 02/03/21 1300       Date of Initial Exercise RX and Referring  Provider   Date 02/03/21    Referring Provider Marcelyn Bruins MD   [facility]     Treadmill   MPH 2.2    Grade 0.5    Minutes 15    METs 2.84      Recumbant Bike   Level 2    RPM 50    Watts 22    Minutes 15    METs 2.6      Biostep-RELP   Level 2    SPM 50    Minutes 15    METs 2      Track   Laps 30    Minutes 15    METs 2.6      Prescription Details   Frequency (times per week) 2    Duration Progress to 30 minutes of continuous aerobic without signs/symptoms of physical distress      Intensity   THRR 40-80% of Max Heartrate 95-130    Ratings of Perceived Exertion 11-13    Perceived Dyspnea 0-4      Progression   Progression Continue to progress workloads to maintain intensity without signs/symptoms of physical distress.      Resistance Training   Training Prescription Yes    Weight 4 lb    Reps 10-15             Perform Capillary Blood Glucose checks as needed.  Exercise Prescription Changes:   Exercise Prescription Changes     Row Name 02/03/21 1300             Response to Exercise   Blood Pressure (Admit) 122/62       Blood Pressure (Exercise) 156/74       Blood Pressure (Exit) 110/66       Heart Rate (Admit) 60 bpm       Heart Rate (Exercise) 83 bpm       Heart Rate (Exit) 62 bpm       Oxygen Saturation (Admit) 94 %       Oxygen Saturation (Exercise) 91 %       Oxygen Saturation (Exit) 94 %       Rating of Perceived Exertion (Exercise) 11       Perceived Dyspnea (Exercise) 3       Symptoms SOB, legs burning       Comments walk test results                Exercise Comments:   Exercise Goals and Review:   Exercise Goals     Row Name 02/03/21 1319             Exercise Goals   Increase Physical Activity Yes  Intervention Provide advice, education, support and counseling about physical activity/exercise needs.;Develop an individualized exercise prescription for aerobic and resistive training based on initial  evaluation findings, risk stratification, comorbidities and participant's personal goals.       Expected Outcomes Short Term: Attend rehab on a regular basis to increase amount of physical activity.;Long Term: Add in home exercise to make exercise part of routine and to increase amount of physical activity.;Long Term: Exercising regularly at least 3-5 days a week.       Increase Strength and Stamina Yes       Intervention Provide advice, education, support and counseling about physical activity/exercise needs.;Develop an individualized exercise prescription for aerobic and resistive training based on initial evaluation findings, risk stratification, comorbidities and participant's personal goals.       Expected Outcomes Short Term: Increase workloads from initial exercise prescription for resistance, speed, and METs.;Short Term: Perform resistance training exercises routinely during rehab and add in resistance training at home;Long Term: Improve cardiorespiratory fitness, muscular endurance and strength as measured by increased METs and functional capacity ( )       Able to understand and use rate of perceived exertion (RPE) scale Yes       Intervention Provide education and explanation on how to use RPE scale       Expected Outcomes Short Term: Able to use RPE daily in rehab to express subjective intensity level;Long Term:  Able to use RPE to guide intensity level when exercising independently       Able to understand and use Dyspnea scale Yes       Intervention Provide education and explanation on how to use Dyspnea scale       Expected Outcomes Short Term: Able to use Dyspnea scale daily in rehab to express subjective sense of shortness of breath during exertion;Long Term: Able to use Dyspnea scale to guide intensity level when exercising independently       Knowledge and understanding of Target Heart Rate Range (THRR) Yes       Intervention Provide education and explanation of THRR including how  the numbers were predicted and where they are located for reference       Expected Outcomes Short Term: Able to use daily as guideline for intensity in rehab;Short Term: Able to state/look up THRR;Long Term: Able to use THRR to govern intensity when exercising independently       Able to check pulse independently Yes       Intervention Provide education and demonstration on how to check pulse in carotid and radial arteries.;Review the importance of being able to check your own pulse for safety during independent exercise       Expected Outcomes Short Term: Able to explain why pulse checking is important during independent exercise;Long Term: Able to check pulse independently and accurately       Understanding of Exercise Prescription Yes       Intervention Provide education, explanation, and written materials on patient's individual exercise prescription       Expected Outcomes Short Term: Able to explain program exercise prescription;Long Term: Able to explain home exercise prescription to exercise independently                Exercise Goals Re-Evaluation :   Discharge Exercise Prescription (Final Exercise Prescription Changes):  Exercise Prescription Changes - 02/03/21 1300       Response to Exercise   Blood Pressure (Admit) 122/62    Blood Pressure (Exercise) 156/74    Blood Pressure (  Exit) 110/66    Heart Rate (Admit) 60 bpm    Heart Rate (Exercise) 83 bpm    Heart Rate (Exit) 62 bpm    Oxygen Saturation (Admit) 94 %    Oxygen Saturation (Exercise) 91 %    Oxygen Saturation (Exit) 94 %    Rating of Perceived Exertion (Exercise) 11    Perceived Dyspnea (Exercise) 3    Symptoms SOB, legs burning    Comments walk test results             Nutrition:  Target Goals: Understanding of nutrition guidelines, daily intake of sodium 1500mg , cholesterol 200mg , calories 30% from fat and 7% or less from saturated fats, daily to have 5 or more servings of fruits and  vegetables.  Education: All About Nutrition: -Group instruction provided by verbal, written material, interactive activities, discussions, models, and posters to present general guidelines for heart healthy nutrition including fat, fiber, MyPlate, the role of sodium in heart healthy nutrition, utilization of the nutrition label, and utilization of this knowledge for meal planning. Follow up email sent as well. Written material given at graduation.   Biometrics:  Pre Biometrics - 02/03/21 1320       Pre Biometrics   Height 5' 10.1" (1.781 m)    Weight 213 lb 9.6 oz (96.9 kg)    BMI (Calculated) 30.55    Single Leg Stand 6.5 seconds              Nutrition Therapy Plan and Nutrition Goals:  Nutrition Therapy & Goals - 02/03/21 1320       Intervention Plan   Intervention Prescribe, educate and counsel regarding individualized specific dietary modifications aiming towards targeted core components such as weight, hypertension, lipid management, diabetes, heart failure and other comorbidities.    Expected Outcomes Short Term Goal: Understand basic principles of dietary content, such as calories, fat, sodium, cholesterol and nutrients.;Short Term Goal: A plan has been developed with personal nutrition goals set during dietitian appointment.;Long Term Goal: Adherence to prescribed nutrition plan.             Nutrition Assessments:  MEDIFICTS Score Key: ?70 Need to make dietary changes  40-70 Heart Healthy Diet ? 40 Therapeutic Level Cholesterol Diet  Flowsheet Row Pulmonary Rehab from 02/03/2021 in [facility] Cardiac and Pulmonary Rehab  Picture Your Plate Total Score on Admission 51      Picture Your Plate Scores: <16 Unhealthy dietary pattern with much room for improvement. 41-50 Dietary pattern unlikely to meet recommendations for good health and room for improvement. 51-60 More healthful dietary pattern, with some room for improvement.  >60 Healthy dietary pattern, although  there may be some specific behaviors that could be improved.   Nutrition Goals Re-Evaluation:   Nutrition Goals Discharge (Final Nutrition Goals Re-Evaluation):   Psychosocial: Target Goals: Acknowledge presence or absence of significant depression and/or stress, maximize coping skills, provide positive support system. Participant is able to verbalize types and ability to use techniques and skills needed for reducing stress and depression.   Education: Stress, Anxiety, and Depression - Group verbal and visual presentation to define topics covered.  Reviews how body is impacted by stress, anxiety, and depression.  Also discusses healthy ways to reduce stress and to treat/manage anxiety and depression.  Written material given at graduation.   Education: Sleep Hygiene -Provides group verbal and written instruction about how sleep can affect your health.  Define sleep hygiene, discuss sleep cycles and impact of sleep habits. Review good sleep hygiene  tips.    Initial Review & Psychosocial Screening:  Initial Psych Review & Screening - 01/24/21 1541       Initial Review   Current issues with Current Stress Concerns    Source of Stress Concerns Family    Comments children stress him. 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      Family Dynamics   Good Support System? Yes   2 daughters, one son (one in [place] calls everyday other in [place]) and a sister.  .     Barriers   Psychosocial barriers to participate in program There are no identifiable barriers or psychosocial needs.;The patient should benefit from training in stress management and relaxation.      Screening Interventions   Interventions Provide feedback about the scores to participant;To provide support and resources with identified psychosocial needs;Encouraged to exercise    Expected Outcomes Short Term goal: Utilizing psychosocial counselor, staff and physician to assist with identification of specific  Stressors or current issues interfering with healing process. Setting desired goal for each stressor or current issue identified.;Long Term Goal: Stressors or current issues are controlled or eliminated.;Short Term goal: Identification and review with participant of any Quality of Life or Depression concerns found by scoring the questionnaire.;Long Term goal: The participant improves quality of Life and PHQ9 Scores as seen by post scores and/or verbalization of changes             Quality of Life Scores:  Scores of 19 and below usually indicate a poorer quality of life in these areas.  A difference of  2-3 points is a clinically meaningful difference.  A difference of 2-3 points in the total score of the Quality of Life Index has been associated with significant improvement in overall quality of life, self-image, physical symptoms, and general health in studies assessing change in quality of life.  PHQ-9: Recent Review Flowsheet Data     Depression screen [facility] 2/9 02/03/2021   Decreased Interest 3   Down, Depressed, Hopeless 2   PHQ - 2 Score 5   Altered sleeping 3   Tired, decreased energy 3   Change in appetite 3   Feeling bad or failure about yourself  0   Trouble concentrating 0   Moving slowly or fidgety/restless 0   Suicidal thoughts 0   PHQ-9 Score 14   Difficult doing work/chores Not difficult at all      Interpretation of Total Score  Total Score Depression Severity:  1-4 = Minimal depression, 5-9 = Mild depression, 10-14 = Moderate depression, 15-19 = Moderately severe depression, 20-27 = Severe depression   Psychosocial Evaluation and Intervention:  Psychosocial Evaluation - 01/24/21 1601       Psychosocial Evaluation & Interventions   Interventions Encouraged to exercise with the program and follow exercise prescription    Comments Robert Combs has no barriers to attending the program. He does travel for work and may need to flex his schedule. He lives alone and has  children in the area and a girlfriend. He does get stressed and has a psychiatrist at the [place] that he talks to. He is a smoker and is trting to quit. HIs support poepl are his family. He wants to improve his breathing,have more energy and quit smoking.    Expected Outcomes [facility] Robert Combs is able to attend at least 2 days aweek depite his work schedule. He is able to Quit tobacco. LTG: Robert Combs has quit and maintains tobacco cessation, he is  able to continue any progress he made during the program.    Continue Psychosocial Services  Follow up required by staff             Psychosocial Re-Evaluation:   Psychosocial Discharge (Final Psychosocial Re-Evaluation):   Education: Education Goals: Education classes will be provided on a weekly basis, covering required topics. Participant will state understanding/return demonstration of topics presented.  Learning Barriers/Preferences:  Learning Barriers/Preferences - 01/24/21 1546       Learning Barriers/Preferences   Learning Barriers None    Learning Preferences None             General Pulmonary Education Topics:  Infection Prevention: - Provides verbal and written material to individual with discussion of infection control including proper hand washing and proper equipment cleaning during exercise session. Flowsheet Row Pulmonary Rehab from 02/03/2021 in [facility] Cardiac and Pulmonary Rehab  Date 02/03/21  Educator [facility]  Instruction Review Code 1- Verbalizes Understanding       Falls Prevention: - Provides verbal and written material to individual with discussion of falls prevention and safety. Flowsheet Row Pulmonary Rehab from 02/03/2021 in [facility] Cardiac and Pulmonary Rehab  Date 02/03/21  Educator [facility]  Instruction Review Code 1- Verbalizes Understanding       Chronic Lung Disease Review: - Group verbal instruction with posters, models, PowerPoint presentations and videos,  to review new updates, new respiratory medications, new  advancements in procedures and treatments. Providing information on websites and "800" numbers for continued self-education. Includes information about supplement oxygen, available portable oxygen systems, continuous and intermittent flow rates, oxygen safety, concentrators, and Medicare reimbursement for oxygen. Explanation of Pulmonary Drugs, including class, frequency, complications, importance of spacers, rinsing mouth after steroid MDI's, and proper cleaning methods for nebulizers. Review of basic lung anatomy and physiology related to function, structure, and complications of lung disease. Review of risk factors. Discussion about methods for diagnosing sleep apnea and types of masks and machines for OSA. Includes a review of the use of types of environmental controls: home humidity, furnaces, filters, dust mite/pet prevention, HEPA vacuums. Discussion about weather changes, air quality and the benefits of nasal washing. Instruction on Warning signs, infection symptoms, calling MD promptly, preventive modes, and value of vaccinations. Review of effective airway clearance, coughing and/or vibration techniques. Emphasizing that all should Create an Action Plan. Written material given at graduation. Flowsheet Row Pulmonary Rehab from 02/03/2021 in [facility] Cardiac and Pulmonary Rehab  Education need identified 02/03/21       AED/CPR: - Group verbal and written instruction with the use of models to demonstrate the basic use of the AED with the basic ABC's of resuscitation.    Anatomy and Cardiac Procedures: - Group verbal and visual presentation and models provide information about basic cardiac anatomy and function. Reviews the testing methods done to diagnose heart disease and the outcomes of the test results. Describes the treatment choices: Medical Management, Angioplasty, or Coronary Bypass Surgery for treating various heart conditions including Myocardial Infarction, Angina, Valve Disease, and Cardiac  Arrhythmias.  Written material given at graduation.   Medication Safety: - Group verbal and visual instruction to review commonly prescribed medications for heart and lung disease. Reviews the medication, class of the drug, and side effects. Includes the steps to properly store meds and maintain the prescription regimen.  Written material given at graduation.   Other: -Provides group and verbal instruction on various topics (see comments)   Knowledge Questionnaire Score:  Knowledge Questionnaire Score - 02/03/21 1323  Knowledge Questionnaire Score   Pre Score 13/16 Education Focus: Chonic Lung Disease, O2 safety              Core Components/Risk Factors/Patient Goals at Admission:  Personal Goals and Risk Factors at Admission - 02/03/21 1323       Core Components/Risk Factors/Patient Goals on Admission    Weight Management Yes;Weight Maintenance;Obesity    Intervention Weight Management: Provide education and appropriate resources to help participant work on and attain dietary goals.;Weight Management: Develop a combined nutrition and exercise program designed to reach desired caloric intake, while maintaining appropriate intake of nutrient and fiber, sodium and fats, and appropriate energy expenditure required for the weight goal.;Weight Management/Obesity: Establish reasonable short term and long term weight goals.;Obesity: Provide education and appropriate resources to help participant work on and attain dietary goals.    Admit Weight 213 lb 9.6 oz (96.9 kg)    Goal Weight: Short Term 208 lb (94.3 kg)    Goal Weight: Long Term 200 lb (90.7 kg)    Expected Outcomes Short Term: Continue to assess and modify interventions until short term weight is achieved;Long Term: Adherence to nutrition and physical activity/exercise program aimed toward attainment of established weight goal;Weight Loss: Understanding of general recommendations for a balanced deficit meal plan, which  promotes 1-2 lb weight loss per week and includes a negative energy balance of [phone] kcal/d;Understanding recommendations for meals to include 15-35% energy as protein, 25-35% energy from fat, 35-60% energy from carbohydrates, less than 200mg  of dietary cholesterol, 20-35 gm of total fiber daily;Understanding of distribution of calorie intake throughout the day with the consumption of 4-5 meals/snacks    Tobacco Cessation Yes    Number of packs per day 1 per day    Intervention Assist the participant in steps to quit. Provide individualized education and counseling about committing to Tobacco Cessation, relapse prevention, and pharmacological support that can be provided by physician.;Education officer, environmental, assist with locating and accessing local/national Quit Smoking programs, and support quit date choice.    Expected Outcomes Short Term: Will demonstrate readiness to quit, by selecting a quit date.;Short Term: Will quit all tobacco product use, adhering to prevention of relapse plan.;Long Term: Complete abstinence from all tobacco products for at least 12 months from quit date.    Improve shortness of breath with ADL's Yes    Intervention Provide education, individualized exercise plan and daily activity instruction to help decrease symptoms of SOB with activities of daily living.    Expected Outcomes Short Term: Improve cardiorespiratory fitness to achieve a reduction of symptoms when performing ADLs;Long Term: Be able to perform more ADLs without symptoms or delay the onset of symptoms    Increase knowledge of respiratory medications and ability to use respiratory devices properly  Yes    Intervention Provide education and demonstration as needed of appropriate use of medications, inhalers, and oxygen therapy.    Expected Outcomes Short Term: Achieves understanding of medications use. Understands that oxygen is a medication prescribed by physician. Demonstrates appropriate use of inhaler  and oxygen therapy.;Long Term: Maintain appropriate use of medications, inhalers, and oxygen therapy.    Diabetes Yes    Intervention Provide education about signs/symptoms and action to take for hypo/hyperglycemia.;Provide education about proper nutrition, including hydration, and aerobic/resistive exercise prescription along with prescribed medications to achieve blood glucose in normal ranges: Fasting glucose 65-99 mg/dL    Expected Outcomes Short Term: Participant verbalizes understanding of the signs/symptoms and immediate care of hyper/hypoglycemia, proper foot care  and importance of medication, aerobic/resistive exercise and nutrition plan for blood glucose control.;Long Term: Attainment of HbA1C < 7%.    Hypertension Yes    Intervention Provide education on lifestyle modifcations including regular physical activity/exercise, weight management, moderate sodium restriction and increased consumption of fresh fruit, vegetables, and low fat dairy, alcohol moderation, and smoking cessation.;Monitor prescription use compliance.    Expected Outcomes Short Term: Continued assessment and intervention until BP is < 140/59mm HG in hypertensive participants. < 130/90mm HG in hypertensive participants with diabetes, heart failure or chronic kidney disease.;Long Term: Maintenance of blood pressure at goal levels.    Lipids Yes    Intervention Provide education and support for participant on nutrition & aerobic/resistive exercise along with prescribed medications to achieve LDL 70mg , HDL >40mg .    Expected Outcomes Short Term: Participant states understanding of desired cholesterol values and is compliant with medications prescribed. Participant is following exercise prescription and nutrition guidelines.;Long Term: Cholesterol controlled with medications as prescribed, with individualized exercise RX and with personalized nutrition plan. Value goals: LDL < 70mg , HDL > 40 mg.              Education:Diabetes - Individual verbal and written instruction to review signs/symptoms of diabetes, desired ranges of glucose level fasting, after meals and with exercise. Acknowledge that pre and post exercise glucose checks will be done for 3 sessions at entry of program. Flowsheet Row Pulmonary Rehab from 02/03/2021 in [facility] Cardiac and Pulmonary Rehab  Date 02/03/21  Educator [facility]  Instruction Review Code 1- Verbalizes Understanding       Know Your Numbers and Heart Failure: - Group verbal and visual instruction to discuss disease risk factors for cardiac and pulmonary disease and treatment options.  Reviews associated critical values for Overweight/Obesity, Hypertension, Cholesterol, and Diabetes.  Discusses basics of heart failure: signs/symptoms and treatments.  Introduces Heart Failure Zone chart for action plan for heart failure.  Written material given at graduation.   Core Components/Risk Factors/Patient Goals Review:   Goals and Risk Factor Review     Row Name 02/03/21 1326             Core Components/Risk Factors/Patient Goals Review   Personal Goals Review Tobacco Cessation       Review Robert Combs is a current tobacco user. Intervention for tobacco cessation was provided at the initial medical review. He was asked about readiness to quit and reported that he is considering it and has cut back already . Patient was advised and educated about tobacco cessation using combination therapy, tobacco cessation classes, quit line, and quit smoking apps. Patient demonstrated understanding of this material but declined hand outs at this time. Staff will continue to provide encouragement and follow up with the patient throughout the program.       Expected Outcomes Short: Continue to reduce number of cigarettes per day Long: Set a quit date                Core Components/Risk Factors/Patient Goals at Discharge (Final Review):   Goals and Risk Factor Review - 02/03/21 1326        Core Components/Risk Factors/Patient Goals Review   Personal Goals Review Tobacco Cessation    Review Robert Combs is a current tobacco user. Intervention for tobacco cessation was provided at the initial medical review. He was asked about readiness to quit and reported that he is considering it and has cut back already . Patient was advised and educated about tobacco cessation using combination therapy, tobacco cessation  classes, quit line, and quit smoking apps. Patient demonstrated understanding of this material but declined hand outs at this time. Staff will continue to provide encouragement and follow up with the patient throughout the program.    Expected Outcomes Short: Continue to reduce number of cigarettes per day Long: Set a quit date             ITP Comments:  ITP Comments     Row Name 01/24/21 1610 02/03/21 1152         ITP Comments Virtual orientation call completed today. he has an appointment on Date: 02/03/2021  for EP eval and gym Orientation.  Documentation of diagnosis can be found in Media Tab Date: 02/03/2021 adding [facility] notees to Media Tab . Completed and gym orientation. Initial ITP created and sent for review to Dr. Jinny Sanders, Medical Director.               Comments: Initial ITP

## 2021-02-03 NOTE — Progress Notes (Signed)
Robert Combs is a current tobacco user. Intervention for tobacco cessation was provided at the initial medical review. He was asked about readiness to quit and reported that he is considering it and has cut back already . Patient was advised and educated about tobacco cessation using combination therapy, tobacco cessation classes, quit line, and quit smoking apps. Patient demonstrated understanding of this material but declined hand outs at this time. Staff will continue to provide encouragement and follow up with the patient throughout the program.

## 2021-02-04 ENCOUNTER — Other Ambulatory Visit: Payer: Self-pay

## 2021-02-04 DIAGNOSIS — J449 Chronic obstructive pulmonary disease, unspecified: Secondary | ICD-10-CM | POA: Diagnosis not present

## 2021-02-04 LAB — GLUCOSE, CAPILLARY
Glucose-Capillary: 187 mg/dL — ABNORMAL HIGH (ref 70–99)
Glucose-Capillary: 171 mg/dL — ABNORMAL HIGH (ref 70–99)

## 2021-02-04 NOTE — Progress Notes (Signed)
Daily Session Note  Patient Details  Name: Robert Combs MRN: [id number] Date of Birth: 11-06-1945 Referring Provider:   [name] Pulmonary Rehab from 02/03/2021 in [facility] Cardiac and Pulmonary Rehab  Referring Provider Danton Sewer MD  [facility]       Encounter Date: 02/04/2021  Check In:  Session Check In - 02/04/21 0715       Check-In   Supervising physician immediately available to respond to emergencies See telemetry face sheet for immediately available ER MD    Location [facility]-Cardiac & Pulmonary Rehab    Staff Present Birdie Sons, MPA, RN;Amanda Oletta Darter, BA, ACSM CEP, Exercise Physiologist;Kara Eliezer Bottom, MS, ASCM CEP, Exercise Physiologist    Virtual Visit No    Medication changes reported     No    Fall or balance concerns reported    No    Tobacco Cessation No Change    Current number of cigarettes/nicotine per day     20    Warm-up and Cool-down Performed on first and last piece of equipment    Resistance Training Performed Yes    VAD Patient? No    PAD/SET Patient? No      Pain Assessment   Currently in Pain? No/denies                Social History   Tobacco Use  Smoking Status Every Day   Packs/day: 1.00   Years: 25.00   Pack years: 25.00   Types: Cigarettes   Last attempt to quit: 07/11/2020   Years since quitting: 0.5  Smokeless Tobacco Never  Tobacco Comments   02/03/21 currently at 1 ppd, [facility] is [facility]    Goals Met:  Independence with exercise equipment Exercise tolerated well No report of cardiac concerns or symptoms Strength training completed today  Goals Unmet:  Not Applicable  Comments: First full day of exercise!  Patient was oriented to gym and equipment including functions, settings, policies, and procedures.  Patient's individual exercise prescription and treatment plan were reviewed.  All starting workloads were established based on the results of the 6 minute walk test done at  initial orientation visit.  The plan for exercise progression was also introduced and progression will be customized based on patient's performance and goals.    Dr. Emily Filbert is Medical Director for [place].  Dr. Ottie Glazier is Medical Director for [facility] Pulmonary Rehabilitation.

## 2021-02-05 DIAGNOSIS — J449 Chronic obstructive pulmonary disease, unspecified: Secondary | ICD-10-CM

## 2021-02-05 NOTE — Progress Notes (Signed)
Completed initial RD consultation ?

## 2021-02-06 ENCOUNTER — Other Ambulatory Visit: Payer: Self-pay

## 2021-02-06 DIAGNOSIS — J449 Chronic obstructive pulmonary disease, unspecified: Secondary | ICD-10-CM | POA: Diagnosis not present

## 2021-02-06 LAB — GLUCOSE, CAPILLARY
Glucose-Capillary: 271 mg/dL — ABNORMAL HIGH (ref 70–99)
Glucose-Capillary: 209 mg/dL — ABNORMAL HIGH (ref 70–99)

## 2021-02-06 NOTE — Progress Notes (Signed)
Daily Session Note  Patient Details  Name: Robert Combs MRN: [id number] Date of Birth: 1946/04/11 Referring Provider:   [name] Pulmonary Rehab from 02/03/2021 in [facility] Cardiac and Pulmonary Rehab  Referring Provider Danton Sewer MD  [facility]       Encounter Date: 02/06/2021  Check In:  Session Check In - 02/06/21 0726       Check-In   Supervising physician immediately available to respond to emergencies See telemetry face sheet for immediately available ER MD    Location [facility]-Cardiac & Pulmonary Rehab    Staff Present Birdie Sons, MPA, Mauricia Area, BS, ACSM CEP, Exercise Physiologist;Amanda Oletta Darter, BA, ACSM CEP, Exercise Physiologist    Virtual Visit No    Medication changes reported     No    Fall or balance concerns reported    No    Tobacco Cessation No Change    Warm-up and Cool-down Performed on first and last piece of equipment    Resistance Training Performed Yes    VAD Patient? No    PAD/SET Patient? No      Pain Assessment   Currently in Pain? No/denies                Social History   Tobacco Use  Smoking Status Every Day   Packs/day: 1.00   Years: 25.00   Pack years: 25.00   Types: Cigarettes   Last attempt to quit: 07/11/2020   Years since quitting: 0.5  Smokeless Tobacco Never  Tobacco Comments   02/03/21 currently at 1 ppd, [facility] and [facility] is [facility] it    Goals Met:  Independence with exercise equipment Exercise tolerated well No report of cardiac concerns or symptoms Strength training completed today  Goals Unmet:  Not Applicable  Comments: Pt able to follow exercise prescription today without complaint.  Will continue to monitor for progression.    Dr. Emily Filbert is Medical Director for [place].  Dr. Ottie Glazier is Medical Director for [facility] Pulmonary Rehabilitation.

## 2021-02-11 ENCOUNTER — Other Ambulatory Visit: Payer: Self-pay

## 2021-02-11 DIAGNOSIS — J449 Chronic obstructive pulmonary disease, unspecified: Secondary | ICD-10-CM

## 2021-02-11 LAB — GLUCOSE, CAPILLARY
Glucose-Capillary: 173 mg/dL — ABNORMAL HIGH (ref 70–99)
Glucose-Capillary: 154 mg/dL — ABNORMAL HIGH (ref 70–99)

## 2021-02-11 NOTE — Progress Notes (Signed)
Daily Session Note  Patient Details  Name: Robert Combs MRN: [id number] Date of Birth: 1946/07/21 Referring Provider:   [name] Pulmonary Rehab from 02/03/2021 in [facility] Cardiac and Pulmonary Rehab  Referring Provider Danton Sewer MD  [facility]       Encounter Date: 02/11/2021  Check In:  Session Check In - 02/11/21 0717       Check-In   Supervising physician immediately available to respond to emergencies See telemetry face sheet for immediately available ER MD    Location [facility]-Cardiac & Pulmonary Rehab    Staff Present Birdie Sons, MPA, RN;Amanda Oletta Darter, BA, ACSM CEP, Exercise Physiologist;Kara Eliezer Bottom, MS, ASCM CEP, Exercise Physiologist    Virtual Visit No    Medication changes reported     No    Fall or balance concerns reported    No    Warm-up and Cool-down Performed on first and last piece of equipment    Resistance Training Performed Yes    VAD Patient? No    PAD/SET Patient? No      Pain Assessment   Currently in Pain? No/denies                Social History   Tobacco Use  Smoking Status Every Day   Packs/day: 1.00   Years: 25.00   Pack years: 25.00   Types: Cigarettes   Last attempt to quit: 07/11/2020   Years since quitting: 0.5  Smokeless Tobacco Never  Tobacco Comments   02/03/21 currently at 1 ppd, [facility] and he is considering it    Goals Met:  Independence with exercise equipment Exercise tolerated well No report of cardiac concerns or symptoms Strength training completed today  Goals Unmet:  Not Applicable  Comments: Pt able to follow exercise prescription today without complaint.  Will continue to monitor for progression.    Dr. Emily Filbert is Medical Director for [name].  Dr. Ottie Glazier is Medical Director for [facility] Pulmonary Rehabilitation.

## 2021-02-18 ENCOUNTER — Other Ambulatory Visit: Payer: Self-pay

## 2021-02-18 ENCOUNTER — Encounter: Payer: No Typology Code available for payment source | Attending: Pulmonary Disease | Admitting: *Deleted

## 2021-02-18 DIAGNOSIS — J449 Chronic obstructive pulmonary disease, unspecified: Secondary | ICD-10-CM

## 2021-02-18 NOTE — Progress Notes (Signed)
Daily Session Note  Patient Details  Name: Robert Combs MRN: [id number] Date of Birth: [birth year] Referring Provider:   [name] Pulmonary Rehab from 02/03/2021 in [facility] Cardiac and Pulmonary Rehab  Referring Provider Danton Sewer MD  [facility]       Encounter Date: 02/18/2021  Check In:  Session Check In - 02/18/21 0809       Check-In   Supervising physician immediately available to respond to emergencies See telemetry face sheet for immediately available ER MD    Location [facility]-Cardiac & Pulmonary Rehab    Staff Present Heath Lark, RN, BSN, CCRP;Kristen Coble, RN,BC,MSN;Melissa Turner, RDN, LDN;Jessica Grano, MA, RCEP, CCRP, CCET    Virtual Visit No    Medication changes reported     No    Fall or balance concerns reported    No    Current number of cigarettes/nicotine per day     10    Warm-up and Cool-down Performed on first and last piece of equipment    Resistance Training Performed Yes    VAD Patient? No    PAD/SET Patient? No      Pain Assessment   Currently in Pain? No/denies                Social History   Tobacco Use  Smoking Status Every Day   Packs/day: 1.00   Years: 25.00   Pack years: 25.00   Types: Cigarettes   Last attempt to quit: 07/11/2020   Years since quitting: 0.6  Smokeless Tobacco Never  Tobacco Comments   02/03/21 currently at 1 ppd, [facility] and he is considering it    Goals Met:  Independence with exercise equipment Exercise tolerated well No report of cardiac concerns or symptoms  Goals Unmet:  Not Applicable  Comments: Pt able to follow exercise prescription today without complaint.  Will continue to monitor for progression.    Dr. Emily Filbert is Medical Director for [place].  Dr. Ottie Glazier is Medical Director for [facility] Pulmonary Rehabilitation.

## 2021-02-20 ENCOUNTER — Encounter: Payer: No Typology Code available for payment source | Admitting: *Deleted

## 2021-02-20 ENCOUNTER — Other Ambulatory Visit: Payer: Self-pay

## 2021-02-20 DIAGNOSIS — J449 Chronic obstructive pulmonary disease, unspecified: Secondary | ICD-10-CM

## 2021-02-20 NOTE — Progress Notes (Signed)
Daily Session Note  Patient Details  Name: Robert Combs MRN: [id number] Date of Birth: 1945-11-03 Referring Provider:   [name] Pulmonary Rehab from 02/03/2021 in [facility] Cardiac and Pulmonary Rehab  Referring Provider Danton Sewer MD  [facility]       Encounter Date: 02/20/2021  Check In:  Session Check In - 02/20/21 0816       Check-In   Supervising physician immediately available to respond to emergencies See telemetry face sheet for immediately available ER MD    Location [facility]-Cardiac & Pulmonary Rehab    Staff Present Heath Lark, RN, BSN, CCRP;Kelly Bollinger, MPA, RN;Melissa [place], RDN, LDN;Jessica Freer, MA, RCEP, CCRP, CCET    Virtual Visit No    Medication changes reported     No    Fall or balance concerns reported    No    Current number of cigarettes/nicotine per day     20    Warm-up and Cool-down Performed on first and last piece of equipment    Resistance Training Performed Yes    VAD Patient? No    PAD/SET Patient? No      Pain Assessment   Currently in Pain? No/denies                Social History   Tobacco Use  Smoking Status Every Day   Packs/day: 1.00   Years: 25.00   Pack years: 25.00   Types: Cigarettes   Last attempt to quit: 07/11/2020   Years since quitting: 0.6  Smokeless Tobacco Never  Tobacco Comments   02/03/21 currently at 1 ppd, [facility] and he is considering it    Goals Met:  Proper associated with RPD/PD & O2 Sat Independence with exercise equipment Exercise tolerated well No report of cardiac concerns or symptoms  Goals Unmet:  Not Applicable  Comments: Pt able to follow exercise prescription today without complaint.  Will continue to monitor for progression.    Dr. Emily Filbert is Medical Director for [place].  Dr. Ottie Glazier is Medical Director for [facility] Pulmonary Rehabilitation.

## 2021-02-26 ENCOUNTER — Encounter: Payer: Self-pay | Admitting: *Deleted

## 2021-02-26 DIAGNOSIS — J449 Chronic obstructive pulmonary disease, unspecified: Secondary | ICD-10-CM

## 2021-02-26 NOTE — Progress Notes (Signed)
Pulmonary Individual Treatment Plan  Patient Details  Name: Robert Combs MRN: [id number] Date of Birth: August 27, 1945 Referring Provider:   Doristine Devoid Pulmonary Rehab from 02/03/2021 in [facility] Cardiac and Pulmonary Rehab  Referring Provider Marcelyn Bruins MD  [facility]]       Initial Encounter Date:  Flowsheet Row Pulmonary Rehab from 02/03/2021 in [facility] Cardiac and Pulmonary Rehab  Date 02/03/21       Visit Diagnosis: Chronic obstructive pulmonary disease, unspecified COPD type (HCC)  Patient's Home Medications on Admission:  Current Outpatient Medications:    albuterol (PROVENTIL HFA;VENTOLIN HFA) 108 (90 Base) MCG/ACT inhaler, Inhale 1 puff into the lungs every 6 (six) hours as needed for wheezing or shortness of breath., Disp: , Rfl:    albuterol (VENTOLIN HFA) 108 (90 Base) MCG/ACT inhaler, Inhale into the lungs., Disp: , Rfl:    Alogliptin Benzoate 25 MG TABS, Take by mouth., Disp: , Rfl:    Ascorbic Acid (VITAMIN C) 100 MG tablet, Take 100 mg by mouth daily. (Patient not taking: Reported on 01/24/2021), Disp: , Rfl:    aspirin EC 81 MG tablet, Take 81 mg by mouth daily.  , Disp: , Rfl:    benzonatate (TESSALON PERLES) 100 MG capsule, Take 1 capsule (100 mg total) by mouth every 6 (six) hours as needed for cough. (Patient not taking: Reported on 01/24/2021), Disp: 30 capsule, Rfl: 0   budesonide-formoterol (SYMBICORT) 160-4.5 MCG/ACT inhaler, Inhale into the lungs., Disp: , Rfl:    cholecalciferol (VITAMIN D) 1000 UNITS tablet, Take 1,000 Units by mouth daily.  , Disp: , Rfl:    losartan (COZAAR) 50 MG tablet, Take 25 mg by mouth daily., Disp: , Rfl:    metFORMIN (GLUCOPHAGE) 500 MG tablet, Take 1 tablet (500 mg) PO daily x 1 week, then increase to 1 tablet PO BID thereafter, Disp: 60 tablet, Rfl: 0   mometasone (ASMANEX) 220 MCG/INH inhaler, Take by mouth., Disp: , Rfl:    omeprazole (PRILOSEC OTC) 20 MG tablet, Take 20 mg by mouth daily., Disp: , Rfl:    traMADol (ULTRAM)  50 MG tablet, Take 1 tablet (50 mg total) by mouth every 6 (six) hours as needed., Disp: 10 tablet, Rfl: 0  Past Medical History: Past Medical History:  Diagnosis Date   COPD (chronic obstructive pulmonary disease) (HCC)    Coronary artery disease    DDD (degenerative disc disease)    Hypertension     Tobacco Use: Social History   Tobacco Use  Smoking Status Every Day   Packs/day: 1.00   Years: 25.00   Pack years: 25.00   Types: Cigarettes   Last attempt to quit: 07/11/2020   Years since quitting: 0.6  Smokeless Tobacco Never  Tobacco Comments   02/03/21 currently at [facility] ppd, [facility]    Labs: Recent Review Flowsheet Data     Labs for ITP Cardiac and Pulmonary Rehab Latest Ref Rng & Units 02/03/2010 11/04/2015   HCO3 20.0 - 24.0 mEq/L - 26.3(H)   TCO2 0 - 100 mmol/L 31 28   O2SAT % - 75.0        Pulmonary Assessment Scores:  Pulmonary Assessment Scores     Row Name 02/03/21 1328         ADL UCSD   ADL Phase Entry     SOB Score total 57     Rest 3     Walk 4     Stairs 5  Bath 0     Dress 0     Shop 2           CAT Score     CAT Score 37           mMRC Score     mMRC Score 0             UCSD: Self-administered rating of dyspnea associated with activities of daily living (ADLs) 6-point scale (0 = "not at all" to 5 = "maximal or unable to do because of breathlessness")  Scoring Scores range from 0 to 120.  Minimally important difference is 5 units  CAT: CAT can identify the health impairment of COPD patients and is better correlated with disease progression.  CAT has a scoring range of zero to 40. The CAT score is classified into four groups of low (less than 10), medium (10 - 20), high (21-30) and very high (31-40) based on the impact level of disease on health status. A CAT score over 10 suggests significant symptoms.  A worsening CAT score could be explained by an exacerbation, poor medication adherence,  poor inhaler technique, or progression of COPD or comorbid conditions.  CAT MCID is 2 points  mMRC: mMRC (Modified Medical Research Council) Dyspnea Scale is used to assess the degree of baseline functional disability in patients of respiratory disease due to dyspnea. No minimal important difference is established. A decrease in score of 1 point or greater is considered a positive change.   Pulmonary Function Assessment:  Pulmonary Function Assessment - 02/03/21 1327       Breath   Shortness of Breath Yes;Fear of Shortness of Breath;Limiting activity             Exercise Target Goals: Exercise Program Goal: Individual exercise prescription set using results from initial 6 min walk test and THRR while considering  patient's activity barriers and safety.   Exercise Prescription Goal: Initial exercise prescription builds to 30-45 minutes a day of aerobic activity, 2-3 days per week.  Home exercise guidelines will be given to patient during program as part of exercise prescription that the participant will acknowledge.  Education: Aerobic Exercise: - Group verbal and visual presentation on the components of exercise prescription. Introduces F.I.T.T principle from ACSM for exercise prescriptions.  Reviews F.I.T.T. principles of aerobic exercise including progression. Written material given at graduation. Flowsheet Row Pulmonary Rehab from 02/20/2021 in [facility] Cardiac and Pulmonary Rehab  Date 02/06/21  Educator [facility]  Instruction Review Code 1- Verbalizes Understanding       Education: Resistance Exercise: - Group verbal and visual presentation on the components of exercise prescription. Introduces F.I.T.T principle from ACSM for exercise prescriptions  Reviews F.I.T.T. principles of resistance exercise including progression. Written material given at graduation.    Education: Exercise & Equipment Safety: - Individual verbal instruction and demonstration of equipment use and safety with  use of the equipment. Flowsheet Row Pulmonary Rehab from 02/20/2021 in [facility] Cardiac and Pulmonary Rehab  Date 02/03/21  Educator [facility]  Instruction Review Code 1- Verbalizes Understanding       Education: Exercise Physiology & General Exercise Guidelines: - Group verbal and written instruction with models to review the exercise physiology of the cardiovascular system and associated critical values. Provides general exercise guidelines with specific guidelines to those with heart or lung disease.    Education: Flexibility, Balance, Mind/Body Relaxation: - Group verbal and visual presentation with interactive activity on the components of exercise prescription. Introduces F.I.T.T principle from ACSM  for exercise prescriptions. Reviews F.I.T.T. principles of flexibility and balance exercise training including progression. Also discusses the mind body connection.  Reviews various relaxation techniques to help reduce and manage stress (i.e. Deep breathing, progressive muscle relaxation, and visualization). Balance handout provided to take home. Written material given at graduation. Flowsheet Row Pulmonary Rehab from 02/20/2021 in [facility] Cardiac and Pulmonary Rehab  Date 02/20/21  Educator [facility]  Instruction Review Code 1- Verbalizes Understanding       Activity Barriers & Risk Stratification:  Activity Barriers & Cardiac Risk Stratification - 02/03/21 1154       Activity Barriers & Cardiac Risk Stratification   Activity Barriers Shortness of Breath;Joint Problems;Deconditioning;Muscular Weakness;Balance Concerns             6 Minute Walk:  6 Minute Walk     Row Name 02/03/21 1152         6 Minute Walk   Phase Initial     Distance 1380 feet     Walk Time 6 minutes     # of Rest Breaks 0     MPH 2.61     METS 2.72     RPE 11     Perceived Dyspnea  3     VO2 Peak 9.51     Symptoms Yes (comment)     Comments legs burning, SOB     Resting HR 60 bpm     Resting BP 122/62     Resting  Oxygen Saturation  94 %     Exercise Oxygen Saturation  during 6 min walk 91 %     Max Ex. HR 83 bpm     Max Ex. BP 156/74     2 Minute Post BP 132/70           Interval HR     1 Minute HR 59     2 Minute HR 66     3 Minute HR 67     4 Minute HR 82     5 Minute HR 83     6 Minute HR 70     2 Minute Post HR 65     Interval Heart Rate? Yes           Interval Oxygen     Interval Oxygen? Yes     Baseline Oxygen Saturation % 94 %     1 Minute Oxygen Saturation % 93 %     1 Minute Liters of Oxygen 0 L  Room Air     2 Minute Oxygen Saturation % 92 %     2 Minute Liters of Oxygen 0 L     3 Minute Oxygen Saturation % 91 %     3 Minute Liters of Oxygen 0 L     4 Minute Oxygen Saturation % 92 %     4 Minute Liters of Oxygen 0 L     5 Minute Oxygen Saturation % 91 %     5 Minute Liters of Oxygen 0 L     6 Minute Oxygen Saturation % 91 %     6 Minute Liters of Oxygen 0 L     2 Minute Post Oxygen Saturation % 96 %     2 Minute Post Liters of Oxygen 0 L            Oxygen Initial Assessment:  Oxygen Initial Assessment - 02/03/21 1327       Home Oxygen   Home Oxygen Device None  Sleep Oxygen Prescription None    Home Exercise Oxygen Prescription None    Home Resting Oxygen Prescription None      Initial 6 min Walk   Oxygen Used None      Program Oxygen Prescription   Program Oxygen Prescription None      Intervention   Short Term Goals To learn and demonstrate proper pursed lip breathing techniques or other breathing techniques. ;To learn and demonstrate proper use of respiratory medications;To learn and understand importance of maintaining oxygen saturations>88%;To learn and understand importance of monitoring SPO2 with pulse oximeter and demonstrate accurate use of the pulse oximeter.    Long  Term Goals Exhibits proper breathing techniques, such as pursed lip breathing or other method taught during program session;Compliance with respiratory medication;Maintenance of O2  saturations>88%;Verbalizes importance of monitoring SPO2 with pulse oximeter and return demonstration             Oxygen Re-Evaluation:  Oxygen Re-Evaluation     Row Name 02/04/21 0718             Program Oxygen Prescription   Program Oxygen Prescription None               Home Oxygen     Home Oxygen Device None       Sleep Oxygen Prescription None       Home Exercise Oxygen Prescription None       Home Resting Oxygen Prescription None       Compliance with Home Oxygen Use Yes               Goals/Expected Outcomes     Short Term Goals To learn and demonstrate proper pursed lip breathing techniques or other breathing techniques. ;To learn and understand importance of maintaining oxygen saturations>88%;To learn and understand importance of monitoring SPO2 with pulse oximeter and demonstrate accurate use of the pulse oximeter.       Long  Term Goals Exhibits proper breathing techniques, such as pursed lip breathing or other method taught during program session;Compliance with respiratory medication;Maintenance of O2 saturations>88%;Verbalizes importance of monitoring SPO2 with pulse oximeter and return demonstration       Comments Reviewed PLB technique with pt.  Talked about how it works and it's importance in maintaining their exercise saturations.       Goals/Expected Outcomes Short: Become more profiecient at using PLB.   Long: Become independent at using PLB.               Oxygen Discharge (Final Oxygen Re-Evaluation):  Oxygen Re-Evaluation - 02/04/21 0718       Program Oxygen Prescription   Program Oxygen Prescription None      Home Oxygen   Home Oxygen Device None    Sleep Oxygen Prescription None    Home Exercise Oxygen Prescription None    Home Resting Oxygen Prescription None    Compliance with Home Oxygen Use Yes      Goals/Expected Outcomes   Short Term Goals To learn and demonstrate proper pursed lip breathing techniques or other breathing techniques.  ;To learn and understand importance of maintaining oxygen saturations>88%;To learn and understand importance of monitoring SPO2 with pulse oximeter and demonstrate accurate use of the pulse oximeter.    Long  Term Goals Exhibits proper breathing techniques, such as pursed lip breathing or other method taught during program session;Compliance with respiratory medication;Maintenance of O2 saturations>88%;Verbalizes importance of monitoring SPO2 with pulse oximeter and return demonstration    Comments Reviewed PLB technique with  pt.  Talked about how it works and it's importance in maintaining their exercise saturations.    Goals/Expected Outcomes Short: Become more profiecient at using PLB.   Long: Become independent at using PLB.             Initial Exercise Prescription:  Initial Exercise Prescription - 02/03/21 1300       Date of Initial Exercise RX and Referring Provider   Date 02/03/21    Referring Provider Marcelyn Bruins MD   [facility]     Treadmill   MPH 2.2    Grade 0.5    Minutes 15    METs 2.84      Recumbant Bike   Level 2    RPM 50    Watts 22    Minutes 15    METs 2.6      Biostep-RELP   Level 2    SPM 50    Minutes 15    METs 2      Track   Laps 30    Minutes 15    METs 2.6      Prescription Details   Frequency (times per week) 2    Duration Progress to 30 minutes of continuous aerobic without signs/symptoms of physical distress      Intensity   THRR 40-80% of Max Heartrate 95-130    Ratings of Perceived Exertion 11-13    Perceived Dyspnea 0-4      Progression   Progression Continue to progress workloads to maintain intensity without signs/symptoms of physical distress.      Resistance Training   Training Prescription Yes    Weight 4 lb    Reps 10-15             Perform Capillary Blood Glucose checks as needed.  Exercise Prescription Changes:   Exercise Prescription Changes     Row Name 02/03/21 1300 02/18/21 1500            Response to Exercise   Blood Pressure (Admit) 122/62 98/54      Blood Pressure (Exercise) 156/74 120/60      Blood Pressure (Exit) 110/66 112/60      Heart Rate (Admit) 60 bpm 65 bpm      Heart Rate (Exercise) 83 bpm 94 bpm      Heart Rate (Exit) 62 bpm 61 bpm      Oxygen Saturation (Admit) 94 % 89 %      Oxygen Saturation (Exercise) 91 % 90 %      Oxygen Saturation (Exit) 94 % 95 %      Rating of Perceived Exertion (Exercise) 11 13      Perceived Dyspnea (Exercise) 3 0      Symptoms SOB, legs burning none      Comments walk test results --      Duration -- Progress to 30 minutes of  aerobic without signs/symptoms of physical distress      Intensity -- THRR unchanged             Progression      Progression -- Continue to progress workloads to maintain intensity without signs/symptoms of physical distress.      Average METs -- 2.4             Resistance Training      Training Prescription -- Yes      Weight -- 4 lb      Reps -- 10-15  Interval Training      Interval Training -- No             Treadmill      MPH -- 2      Grade -- 0      Minutes -- 15      METs -- 2.53             Recumbant Bike      Level -- 2      Watts -- 19      Minutes -- 15      METs -- 2.66             Biostep-RELP      Level -- 2      Minutes -- 15      METs -- 2             Track      Laps -- 27      Minutes -- 15      METs -- 2.47              Exercise Comments:   Exercise Comments     Row Name 02/04/21 0717           Exercise Comments First full day of exercise!  Patient was oriented to gym and equipment including functions, settings, policies, and procedures.  Patient's individual exercise prescription and treatment plan were reviewed.  All starting workloads were established based on the results of the 6 minute walk test done at initial orientation visit.  The plan for exercise progression was also introduced and progression will be customized based on  patient's performance and goals.                Exercise Goals and Review:   Exercise Goals     Row Name 02/03/21 1319             Exercise Goals   Increase Physical Activity Yes       Intervention Provide advice, education, support and counseling about physical activity/exercise needs.;Develop an individualized exercise prescription for aerobic and resistive training based on initial evaluation findings, risk stratification, comorbidities and participant's personal goals.       Expected Outcomes Short Term: Attend rehab on a regular basis to increase amount of physical activity.;Long Term: Add in home exercise to make exercise part of routine and to increase amount of physical activity.;Long Term: Exercising regularly at least 3-5 days a week.       Increase Strength and Stamina Yes       Intervention Provide advice, education, support and counseling about physical activity/exercise needs.;Develop an individualized exercise prescription for aerobic and resistive training based on initial evaluation findings, risk stratification, comorbidities and participant's personal goals.       Expected Outcomes Short Term: Increase workloads from initial exercise prescription for resistance, speed, and METs.;Short Term: Perform resistance training exercises routinely during rehab and add in resistance training at home;Long Term: Improve cardiorespiratory fitness, muscular endurance and strength as measured by increased METs and functional capacity ( )       Able to understand and use rate of perceived exertion (RPE) scale Yes       Intervention Provide education and explanation on how to use RPE scale       Expected Outcomes Short Term: Able to use RPE daily in rehab to express subjective intensity level;Long Term:  Able to use RPE to guide intensity level when exercising independently  Able to understand and use Dyspnea scale Yes       Intervention Provide education and explanation on how  to use Dyspnea scale       Expected Outcomes Short Term: Able to use Dyspnea scale daily in rehab to express subjective sense of shortness of breath during exertion;Long Term: Able to use Dyspnea scale to guide intensity level when exercising independently       Knowledge and understanding of Target Heart Rate Range (THRR) Yes       Intervention Provide education and explanation of THRR including how the numbers were predicted and where they are located for reference       Expected Outcomes Short Term: Able to use daily as guideline for intensity in rehab;Short Term: Able to state/look up THRR;Long Term: Able to use THRR to govern intensity when exercising independently       Able to check pulse independently Yes       Intervention Provide education and demonstration on how to check pulse in carotid and radial arteries.;Review the importance of being able to check your own pulse for safety during independent exercise       Expected Outcomes Short Term: Able to explain why pulse checking is important during independent exercise;Long Term: Able to check pulse independently and accurately       Understanding of Exercise Prescription Yes       Intervention Provide education, explanation, and written materials on patient's individual exercise prescription       Expected Outcomes Short Term: Able to explain program exercise prescription;Long Term: Able to explain home exercise prescription to exercise independently                Exercise Goals Re-Evaluation :  Exercise Goals Re-Evaluation     Row Name 02/04/21 0717 02/18/21 1509           Exercise Goal Re-Evaluation   Exercise Goals Review Knowledge and understanding of Target Heart Rate Range (THRR);Increase Physical Activity;Able to understand and use rate of perceived exertion (RPE) scale;Understanding of Exercise Prescription;Able to understand and use Dyspnea scale;Able to check pulse independently;Increase Strength and Stamina Increase  Physical Activity;Increase Strength and Stamina;Understanding of Exercise Prescription      Comments Reviewed RPE and dyspnea scales, THR and program prescription with pt today.  Pt voiced understanding and was given a copy of goals to take home. Robert Combs is off to a good start in rehab.  He has only completed his first 4 full days of exercise thus far.  He is at 2.0 mph on the treadmill.  We will continue to monitor his progress and encourage good attendance.      Expected Outcomes Short: Use RPE daily to regulate intensity. Long: Follow program prescription in THR. Short: Attend rehab regularly Long: Continue to improve stamina.               Discharge Exercise Prescription (Final Exercise Prescription Changes):  Exercise Prescription Changes - 02/18/21 1500       Response to Exercise   Blood Pressure (Admit) 98/54    Blood Pressure (Exercise) 120/60    Blood Pressure (Exit) 112/60    Heart Rate (Admit) 65 bpm    Heart Rate (Exercise) 94 bpm    Heart Rate (Exit) 61 bpm    Oxygen Saturation (Admit) 89 %    Oxygen Saturation (Exercise) 90 %    Oxygen Saturation (Exit) 95 %    Rating of Perceived Exertion (Exercise) 13    Perceived Dyspnea (  Exercise) 0    Symptoms none    Duration Progress to 30 minutes of  aerobic without signs/symptoms of physical distress    Intensity THRR unchanged      Progression   Progression Continue to progress workloads to maintain intensity without signs/symptoms of physical distress.    Average METs 2.4      Resistance Training   Training Prescription Yes    Weight 4 lb    Reps 10-15      Interval Training   Interval Training No      Treadmill   MPH 2    Grade 0    Minutes 15    METs 2.53      Recumbant Bike   Level 2    Watts 19    Minutes 15    METs 2.66      Biostep-RELP   Level 2    Minutes 15    METs 2      Track   Laps 27    Minutes 15    METs 2.47             Nutrition:  Target Goals: Understanding of nutrition  guidelines, daily intake of sodium 1500mg , cholesterol 200mg , calories 30% from fat and 7% or less from saturated fats, daily to have 5 or more servings of fruits and vegetables.  Education: All About Nutrition: -Group instruction provided by verbal, written material, interactive activities, discussions, models, and posters to present general guidelines for heart healthy nutrition including fat, fiber, MyPlate, the role of sodium in heart healthy nutrition, utilization of the nutrition label, and utilization of this knowledge for meal planning. Follow up email sent as well. Written material given at graduation.   Biometrics:  Pre Biometrics - 02/03/21 1320       Pre Biometrics   Height 5' 10.1" (1.781 m)    Weight 213 lb 9.6 oz (96.9 kg)    BMI (Calculated) 30.55    Single Leg Stand 6.5 seconds              Nutrition Therapy Plan and Nutrition Goals:  Nutrition Therapy & Goals - 02/05/21 0948       Nutrition Therapy   Diet Heart healthy, low Na, diabetes friendly    Protein (specify units) 110-115g    Fiber 30 grams    Whole Grain Foods 3 servings    Saturated Fats 12 max. grams    Fruits and Vegetables 8 servings/day    Sodium 1.5 grams      Personal Nutrition Goals   Nutrition Goal ST: add mid morning snakc to avoid probable low BG and intense hunger, check BG at least 1x/day. LT: maintain BG during the day, choose healthier options that follow MyPlate stucture when eating out for lunch.    Comments He doesn't normally check his BG at home. His MD said his BG was good when he was last in per [facility]. He reports no shortness of breath during or after eating. He reports that his appetite is lower than used to be. 221lb--> 214lbs in 3 weeks and feels the difference in his clothes. He feels his appetite was the same over this time. B: weekend: bacon and egg sandwich (1/2) L: go out: vegetables and meat or sandwiches (hamburger, hot dog, bolonga sandwich) D: cabbage and green  beans, baked chicken, pork chop, corn, potatoes -baked, spaghetti with meat sauce and salad (not fond of pasta), he does not like rice, uses butter and olive oil, he  reports not liking salt so she will use a little bit of salt - girlfriend cooks. He reports waiting until he gets "extremely hungry" to eat. He feels hunger when his legs get weak or he has stomach pains. He feels it's hard for him to get balanced meal at work when he goes out for lunch. We will start with a mid morning snack to avoid extreme hunger and probable low BG and then move to modifying lunch. Discussed importance of checking BG - he reports having all the tools to check and what to do for low BG. Discussed heart healthy eaitng, diabetes friendly eating, and COPD friendly eating.      Intervention Plan   Intervention Prescribe, educate and counsel regarding individualized specific dietary modifications aiming towards targeted core components such as weight, hypertension, lipid management, diabetes, heart failure and other comorbidities.    Expected Outcomes Short Term Goal: Understand basic principles of dietary content, such as calories, fat, sodium, cholesterol and nutrients.;Short Term Goal: A plan has been developed with personal nutrition goals set during dietitian appointment.;Long Term Goal: Adherence to prescribed nutrition plan.             Nutrition Assessments:  MEDIFICTS Score Key: ?70 Need to make dietary changes  40-70 Heart Healthy Diet ? 40 Therapeutic Level Cholesterol Diet  Flowsheet Row Pulmonary Rehab from 02/03/2021 in [facility] Cardiac and Pulmonary Rehab  Picture Your Plate Total Score on Admission 51      Picture Your Plate Scores: <16 Unhealthy dietary pattern with much room for improvement. 41-50 Dietary pattern unlikely to meet recommendations for good health and room for improvement. 51-60 More healthful dietary pattern, with some room for improvement.  >60 Healthy dietary pattern, although  there may be some specific behaviors that could be improved.   Nutrition Goals Re-Evaluation:   Nutrition Goals Discharge (Final Nutrition Goals Re-Evaluation):   Psychosocial: Target Goals: Acknowledge presence or absence of significant depression and/or stress, maximize coping skills, provide positive support system. Participant is able to verbalize types and ability to use techniques and skills needed for reducing stress and depression.   Education: Stress, Anxiety, and Depression - Group verbal and visual presentation to define topics covered.  Reviews how body is impacted by stress, anxiety, and depression.  Also discusses healthy ways to reduce stress and to treat/manage anxiety and depression.  Written material given at graduation.   Education: Sleep Hygiene -Provides group verbal and written instruction about how sleep can affect your health.  Define sleep hygiene, discuss sleep cycles and impact of sleep habits. Review good sleep hygiene tips.    Initial Review & Psychosocial Screening:  Initial Psych Review & Screening - 01/24/21 1541       Initial Review   Current issues with Current Stress Concerns    Source of Stress Concerns Family    Comments children stress him. 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      Family Dynamics   Good Support System? Yes   2 daughters, one son (one in [place] calls everyday other in [place]) and a sister.  .     Barriers   Psychosocial barriers to participate in program There are no identifiable barriers or psychosocial needs.;The patient should benefit from training in stress management and relaxation.      Screening Interventions   Interventions Provide feedback about the scores to participant;To provide support and resources with identified psychosocial needs;Encouraged to exercise    Expected Outcomes Short Term  goal: Utilizing psychosocial counselor, staff and physician to assist with identification of specific  Stressors or current issues interfering with healing process. Setting desired goal for each stressor or current issue identified.;Long Term Goal: Stressors or current issues are controlled or eliminated.;Short Term goal: Identification and review with participant of any Quality of Life or Depression concerns found by scoring the questionnaire.;Long Term goal: The participant improves quality of Life and PHQ9 Scores as seen by post scores and/or verbalization of changes             Quality of Life Scores:  Scores of 19 and below usually indicate a poorer quality of life in these areas.  A difference of  2-3 points is a clinically meaningful difference.  A difference of 2-3 points in the total score of the Quality of Life Index has been associated with significant improvement in overall quality of life, self-image, physical symptoms, and general health in studies assessing change in quality of life.  PHQ-9: Recent Review Flowsheet Data     Depression screen [facility] 2/9 02/03/2021   Decreased Interest 3   Down, Depressed, Hopeless 2   PHQ - 2 Score 5   Altered sleeping 3   Tired, decreased energy 3   Change in appetite 3   Feeling bad or failure about yourself  0   Trouble concentrating 0   Moving slowly or fidgety/restless 0   Suicidal thoughts 0   PHQ-9 Score 14   Difficult doing work/chores Not difficult at all      Interpretation of Total Score  Total Score Depression Severity:  1-4 = Minimal depression, 5-9 = Mild depression, 10-14 = Moderate depression, 15-19 = Moderately severe depression, 20-27 = Severe depression   Psychosocial Evaluation and Intervention:  Psychosocial Evaluation - 01/24/21 1601       Psychosocial Evaluation & Interventions   Interventions Encouraged to exercise with the program and follow exercise prescription    Comments Robert Combs has no barriers to attending the program. He does travel for work and may need to flex his schedule. He lives alone and has  children in the area and a girlfriend. He does get stressed and has a psychiatrist at the [place] that he talks to. He is a smoker and is trting to quit. HIs support poepl are his family. He wants to improve his breathing,have more energy and quit smoking.    Expected Outcomes STG Robert Combs is able to attend at least 2 days aweek depite his work schedule. He is able to Quit tobacco. LTG: Robert Combs has quit and maintains tobacco cessation, he is able to continue any progress he made during the program.    Continue Psychosocial Services  Follow up required by staff             Psychosocial Re-Evaluation:   Psychosocial Discharge (Final Psychosocial Re-Evaluation):   Education: Education Goals: Education classes will be provided on a weekly basis, covering required topics. Participant will state understanding/return demonstration of topics presented.  Learning Barriers/Preferences:  Learning Barriers/Preferences - 01/24/21 1546       Learning Barriers/Preferences   Learning Barriers None    Learning Preferences None             General Pulmonary Education Topics:  Infection Prevention: - Provides verbal and written material to individual with discussion of infection control including proper hand washing and proper equipment cleaning during exercise session. Flowsheet Row Pulmonary Rehab from 02/20/2021 in [facility] Cardiac and Pulmonary Rehab  Date 02/03/21  Educator [facility]  Instruction Review Code 1- Verbalizes Understanding       Falls Prevention: - Provides verbal and written material to individual with discussion of falls prevention and safety. Flowsheet Row Pulmonary Rehab [facility] Card[facility] Pulmonary Re[facility]  Date 02/03/21  Educator [facility]  Instruction Review Code 1- Verbalizes Understanding       Chronic Lung Disease Review: - Group verbal instruction with posters, models, PowerPoint presentations and videos,  to review new updates, new respiratory medications, new  advancements in procedures and treatments. Providing information on websites and "800" numbers for continued self-education. Includes information about supplement oxygen, available portable oxygen systems, continuous and intermittent flow rates, oxygen safety, concentrators, and Medicare reimbursement for oxygen. Explanation of Pulmonary Drugs, including class, frequency, complications, importance of spacers, rinsing mouth after steroid MDI's, and proper cleaning methods for nebulizers. Review of basic lung anatomy and physiology related to function, structure, and complications of lung disease. Review of risk factors. Discussion about methods for diagnosing sleep apnea and types of masks and machines for OSA. Includes a review of the use of types of environmental controls: home humidity, furnaces, filters, dust mite/pet prevention, HEPA vacuums. Discussion about weather changes, air quality and the benefits of nasal washing. Instruction on Warning signs, infection symptoms, calling MD promptly, preventive modes, and value of vaccinations. Review of effective airway clearance, coughing and/or vibration techniques. Emphasizing that all should Create an Action Plan. Written material given at graduation. Flowsheet Row [facility]monary [facility]  Education need identified 02/03/21       AED/CPR: - Group verbal and written instruction with the use of models to demonstrate the basic use of the AED with the basic ABC's of resuscitation.    Anatomy and Cardiac Procedures: - Group verbal and visual presentation and models provide information about basic cardiac anatomy and function. Reviews the testing methods done to diagnose heart disease and the outcomes of the test results. Describes the treatment choices: Medical Management, Angioplasty, or Coronary Bypass Surgery for treating various heart conditions including Myocardial Infarction, Angina, Valve Disease, and Cardiac  Arrhythmias.  Written material given at graduation.   Medication Safety: - Group verbal and visual instruction to review commonly prescribed medications for heart and lung disease. Reviews the medication, class of the drug, and side effects. Includes the steps to properly store meds and maintain the prescription regimen.  Written material given at graduation.   Other: -Provides group and verbal instruction on various topics (see comments)   Knowledge Questionnaire Score:  Knowledge Questionnaire Score - 02/03/21 1323       Knowledge Questionnaire Score   Pre Score 13/16 Education Focus: Chonic Lung Disease, O2 safety              Core Components/Risk Factors/Patient Goals at Admission:  Personal Goals and Risk Factors at Admission - 02/03/21 1323       Core Components/Risk Factors/Patient Goals on Admission    Weight Management Yes;Weight Maintenance;Obesity    Intervention Weight Management: Provide education and appropriate resources to help participant work on and attain dietary goals.;Weight Management: Develop a combined nutrition and exercise program designed to reach desired caloric intake, while maintaining appropriate intake of nutrient and fiber, sodium and fats, and appropriate energy expenditure required for the weight goal.;Weight Management/Obesity: Establish reasonable short term and long term weight goals.;Obesity: Provide education and appropriate resources to help participant work on and attain dietary goals.    Admit Weight 213 lb 9.6 oz (96.9 kg)  Goal Weight: Short Term 208 lb (94.3 kg)    Goal Weight: Long Term 200 lb (90.7 kg)    Expected Outcomes Short Term: Continue to assess and modify interventions until short term weight is achieved;Long Term: Adherence to nutrition and physical activity/exercise program aimed toward attainment of established weight goal;Weight Loss: Understanding of general recommendations for a balanced deficit meal plan, which  promotes 1-2 lb weight loss per week and includes a negative energy balance of [phone] kcal/d;Understanding recommendations for meals to include 15-35% energy as protein, 25-35% energy from fat, 35-60% energy from carbohydrates, less than 200mg  of dietary cholesterol, 20-35 gm of total fiber daily;Understanding of distribution of calorie intake throughout the day with the consumption of 4-5 meals/snacks    Tobacco Cessation Yes    Number of packs per day 1 per day    Intervention Assist the participant in steps to quit. Provide individualized education and counseling about committing to Tobacco Cessation, relapse prevention, and pharmacological support that can be provided by physician.; , assist with locating and accessing local/national Quit Smoking programs, and support quit date choice.    Expected Outcomes Short Term: Will demonstrate readiness to quit, by selecting a quit date.;Short Term: Will quit all tobacco product use, adhering to prevention of relapse plan.;Long Term: Complete abstinence from all tobacco products for at least 12 months from quit date.    Improve shortness of breath with ADL's Yes    Intervention Provide education, individualized exercise plan and daily activity instruction to help decrease symptoms of SOB with activities of daily living.    Expected Outcomes Short Term: Improve cardiorespiratory fitness to achieve a reduction of symptoms when performing ADLs;Long Term: Be able to perform more ADLs without symptoms or delay the onset of symptoms    Increase knowledge of respiratory medications and ability to use respiratory devices properly  Yes    Intervention Provide education and demonstration as needed of appropriate use of medications, inhalers, and oxygen therapy.    Expected Outcomes Short Term: Achieves understanding of medications use. Understands that oxygen is a medication prescribed by physician. Demonstrates appropriate use of inhaler  and oxygen therapy.;Long Term: Maintain appropriate use of medications, inhalers, and oxygen therapy.    Diabetes Yes    Intervention Provide education about signs/symptoms and action to take for hypo/hyperglycemia.;Provide education about proper nutrition, including hydration, and aerobic/resistive exercise prescription along with prescribed medications to achieve blood glucose in normal ranges: Fasting glucose 65-99 mg/dL    Expected Outcomes Short Term: Participant verbalizes understanding of the signs/symptoms and immediate care of hyper/hypoglycemia, proper foot care and importance of medication, aerobic/resistive exercise and nutrition plan for blood glucose control.;Long Term: Attainment of HbA1C < 7%.    Hypertension Yes    Intervention Provide education on lifestyle modifcations including regular physical activity/exercise, weight management, moderate sodium restriction and increased consumption of fresh fruit, vegetables, and low fat dairy, alcohol moderation, and smoking cessation.;Monitor prescription use compliance.    Expected Outcomes Short Term: Continued assessment and intervention until BP is < 140/50mm HG in hypertensive participants. < 130/34mm HG in hypertensive participants with diabetes, heart failure or chronic kidney disease.;Long Term: Maintenance of blood pressure at goal levels.    Lipids Yes    Intervention Provide education and support for participant on nutrition & aerobic/resistive exercise along with prescribed medications to achieve LDL 70mg , HDL >40mg .    Expected Outcomes Short Term: Participant states understanding of desired cholesterol values and is compliant with medications prescribed. Participant is following  exercise prescription and nutrition guidelines.;Long Term: Cholesterol controlled with medications as prescribed, with individualized exercise RX and with personalized nutrition plan. Value goals: LDL < 70mg , HDL > 40 mg.              Education:Diabetes - Individual verbal and written instruction to review signs/symptoms of diabetes, desired ranges of glucose level fasting, after meals and with exercise. Acknowledge that pre and post exercise glucose checks will be done for 3 sessions at entry of program. Flowsheet Row Pulmonary Rehab from 02/20/2021 in [facility] Cardiac and Pulmonary Rehab  Date 02/03/21  Educator [facility]  Instruction Review Code 1- Verbalizes Understanding       Know Your Numbers and Heart Failure: - Group verbal and visual instruction to discuss disease risk factors for cardiac and pulmonary disease and treatment options.  Reviews associated critical values for Overweight/Obesity, Hypertension, Cholesterol, and Diabetes.  Discusses basics of heart failure: signs/symptoms and treatments.  Introduces Heart Failure Zone chart for action plan for heart failure.  Written material given at graduation.   Core Components/Risk Factors/Patient Goals Review:   Goals and Risk Factor Review     Row Name 02/03/21 1326             Core Components/Risk Factors/Patient Goals Review   Personal Goals Review Tobacco Cessation       Review Robert Combs is a current tobacco user. Intervention for tobacco cessation was provided at the initial medical review. He was asked about readiness to quit and reported that he is considering it and has cut back already . Patient was advised and educated about tobacco cessation using combination therapy, tobacco cessation classes, quit line, and quit smoking apps. Patient demonstrated understanding of this material but declined hand outs at this time. Staff will continue to provide encouragement and follow up with the patient throughout the program.       Expected Outcomes Short: Continue to reduce number of cigarettes per day Long: Set a quit date                Core Components/Risk Factors/Patient Goals at Discharge (Final Review):   Goals and Risk Factor Review - 02/03/21 1326        Core Components/Risk Factors/Patient Goals Review   Personal Goals Review Tobacco Cessation    Review Robert Combs is a current tobacco user. Intervention for tobacco cessation was provided at the initial medical review. He was asked about readiness to quit and reported that he is considering it and has cut back already . Patient was advised and educated about tobacco cessation using combination therapy, tobacco cessation classes, quit line, and quit smoking apps. Patient demonstrated understanding of this material but declined hand outs at this time. Staff will continue to provide encouragement and follow up with the patient throughout the program.    Expected Outcomes Short: Continue to reduce number of cigarettes per day Long: Set a quit date             ITP Comments:  ITP Comments     Row Name 01/24/21 1610 02/03/21 1152 02/04/21 0717 02/05/21 1022 02/26/21 1154   ITP Comments Virtual orientation call completed today. he has an appointment on Date: 02/03/2021  for EP eval and gym Orientation.  Documentation of diagnosis can be found in Media Tab Date: 02/03/2021 adding [facility] notees to Media Tab . Completed 02/05/2021 and gym orientation. Initial ITP created and sent for review to Dr. , Medical Director. First full day of exercise!  Patient was oriented  to gym and equipment including functions, settings, policies, and procedures.  Patient's individual exercise prescription and treatment plan were reviewed.  All starting workloads were established based on the results of the 6 minute walk test done at initial orientation visit.  The plan for exercise progression was also introduced and progression will be customized based on patient's performance and goals. Completed initial RD consultation 30 Day review completed. Medical Director ITP review done, changes made as directed, and signed approval by Medical Director.            Comments:

## 2021-03-04 ENCOUNTER — Other Ambulatory Visit: Payer: Self-pay

## 2021-03-04 DIAGNOSIS — J449 Chronic obstructive pulmonary disease, unspecified: Secondary | ICD-10-CM

## 2021-03-04 NOTE — Progress Notes (Signed)
Daily Session Note  Patient Details  Name: DIAZ CRAGO MRN: [id number] Date of Birth: 01/05/1946 Referring Provider:   [name] Pulmonary Rehab from 02/03/2021 in [facility] Cardiac and Pulmonary Rehab  Referring Provider Danton Sewer MD  [facility]       Encounter Date: 03/04/2021  Check In:  Session Check In - 03/04/21 0716       Check-In   Supervising physician immediately available to respond to emergencies See telemetry face sheet for immediately available ER MD    Location [facility]-Cardiac & Pulmonary Rehab    Staff Present Birdie Sons, MPA, RN;Jessica Bradford, MA, RCEP, CCRP, CCET;Laureen Brown, BS, RRT, CPFT    Virtual Visit No    Medication changes reported     No    Fall or balance concerns reported    No    Tobacco Cessation Use Decreased    Current number of cigarettes/nicotine per day     12    Warm-up and Cool-down Performed on first and last piece of equipment    Resistance Training Performed Yes    VAD Patient? No    PAD/SET Patient? No      Pain Assessment   Currently in Pain? No/denies                Social History   Tobacco Use  Smoking Status Every Day   Packs/day: 1.00   Years: 25.00   Pack years: 25.00   Types: Cigarettes   Last attempt to quit: 07/11/2020   Years since quitting: 0.6  Smokeless Tobacco Never  Tobacco Comments   02/03/21 currently at 1 ppd, [facility] and [facility] is [facility] it    Goals Met:  Independence with exercise equipment Exercise tolerated well No report of cardiac concerns or symptoms Strength training completed today  Goals Unmet:  Not Applicable  Comments: Pt able to follow exercise prescription today without complaint.  Will continue to monitor for progress.  Reviewed home exercise with pt today.  Pt plans to walk at home and use staff videos for exercise.  Reviewed THR, pulse, RPE, sign and symptoms, pulse oximetery and when to call 911 or MD.  Also discussed weather considerations  and indoor options.  Pt voiced understanding.   Dr. Emily Filbert is Medical Director for [place].  Dr. Ottie Glazier is Medical Director for [facility] Pulmonary Rehabilitation.

## 2021-03-06 ENCOUNTER — Other Ambulatory Visit: Payer: Self-pay

## 2021-03-06 DIAGNOSIS — J449 Chronic obstructive pulmonary disease, unspecified: Secondary | ICD-10-CM | POA: Diagnosis not present

## 2021-03-06 NOTE — Progress Notes (Signed)
Daily Session Note  Patient Details  Name: BLAYDE BACIGALUPI MRN: [id number] Date of Birth: [birth year] Referring Provider:   [name] Pulmonary Rehab from 02/03/2021 in [facility] Cardiac and Pulmonary Rehab  Referring Provider Danton Sewer MD  [facility]       Encounter Date: 03/06/2021  Check In:  Session Check In - 03/06/21 0709       Check-In   Supervising physician immediately available to respond to emergencies See telemetry face sheet for immediately available ER MD    Location [facility]-Cardiac & Pulmonary Rehab    Staff Present Birdie Sons, MPA, Elveria Rising, BA, ACSM CEP, Exercise Physiologist;Joseph [place], RCP,RRT,BSRT;Arrietty Dercole Adams, BS, ACSM CEP, Exercise Physiologist    Virtual Visit No    Medication changes reported     No    Fall or balance concerns reported    No    Tobacco Cessation No Change    Warm-up and Cool-down Performed on first and last piece of equipment    Resistance Training Performed Yes    VAD Patient? No    PAD/SET Patient? No      Pain Assessment   Currently in Pain? No/denies                Social History   Tobacco Use  Smoking Status Every Day   Packs/day: 1.00   Years: 25.00   Pack years: 25.00   Types: Cigarettes   Last attempt to quit: 07/11/2020   Years since quitting: 0.6  Smokeless Tobacco Never  Tobacco Comments   02/03/21 currently at 1 ppd, [facility] and he is considering it    Goals Met:  Independence with exercise equipment Exercise tolerated well No report of cardiac concerns or symptoms Strength training completed today  Goals Unmet:  Not Applicable  Comments: Pt able to follow exercise prescription today without complaint.  Will continue to monitor for progression.    Dr. Emily Filbert is Medical Director for [place].  Dr. Ottie Glazier is Medical Director for [facility] Pulmonary Rehabilitation.

## 2021-03-11 ENCOUNTER — Other Ambulatory Visit: Payer: Self-pay

## 2021-03-11 DIAGNOSIS — J449 Chronic obstructive pulmonary disease, unspecified: Secondary | ICD-10-CM | POA: Diagnosis not present

## 2021-03-11 NOTE — Progress Notes (Signed)
Daily Session Note  Patient Details  Name: Robert Combs MRN: [id number] Date of Birth: 1946-08-03 Referring Provider:   [name] from 02/03/2021 in [facility]  Referring Provider Danton Sewer MD  [facility]       Encounter Date: 03/11/2021  Check In:  Session Check In - 03/11/21 0706       Check-In   Supervising physician immediately available to respond to emergencies See telemetry face sheet for immediately available ER MD    Location [facility]-Cardiac & Pulmonary Rehab    Staff Present Birdie Sons, MPA, RN;Amanda Oletta Darter, BA, ACSM CEP, Exercise Physiologist;Jessica Luan Pulling, MA, RCEP, CCRP, CCET    Virtual Visit No    Medication changes reported     No    Fall or balance concerns reported    No    Tobacco Cessation No Change    Current number of cigarettes/nicotine per day     12    Warm-up and Cool-down Performed on first and last piece of equipment    Resistance Training Performed Yes    VAD Patient? No    PAD/SET Patient? No      Pain Assessment   Currently in Pain? No/denies                Social History   Tobacco Use  Smoking Status Every Day   Packs/day: 1.00   Years: 25.00   Pack years: 25.00   Types: Cigarettes   Last attempt to quit: 07/11/2020   Years since quitting: 0.6  Smokeless Tobacco Never  Tobacco Comments   02/03/21 currently at 1 ppd, [facility]    Goals Met:  Independence with exercise equipment Exercise tolerated well No report of cardiac concerns or symptoms Strength training completed today  Goals Unmet:  Not Applicable  Comments: Pt able to follow exercise prescription today without complaint.  Will continue to monitor for progression.    Dr. Emily Filbert is Medical Director for [place].  Dr. Ottie Glazier is Medical Director for [facility] Pulmonary Rehabilitation.

## 2021-03-25 ENCOUNTER — Encounter: Payer: No Typology Code available for payment source | Attending: Pulmonary Disease

## 2021-03-25 ENCOUNTER — Other Ambulatory Visit: Payer: Self-pay

## 2021-03-25 DIAGNOSIS — J449 Chronic obstructive pulmonary disease, unspecified: Secondary | ICD-10-CM | POA: Diagnosis not present

## 2021-03-25 NOTE — Progress Notes (Signed)
Daily Session Note  Patient Details  Name: Robert Combs MRN: [id number] Date of Birth: Dec 30, 1945 Referring Provider:   [name] [facility] Re[facility] from 02/03/2021 in [facility] Card[facility] Pul[facility] Re[facility]  Referring Provider Danton Sewer MD  [facility]       Encounter Date: 03/25/2021  Check In:  Session Check In - 03/25/21 0717       Check-In   Supervising physician immediately available to respond to emergencies See telemetry face sheet for immediately available ER MD    Location [facility]-Cardiac & Pulmonary Rehab    Staff Present Birdie Sons, MPA, RN;Amanda Oletta Darter, BA, ACSM CEP, Exercise Physiologist;Jessica [place], MA, RCEP, CCRP, CCET    Virtual Visit No    Medication changes reported     No    Fall or balance concerns reported    No    Tobacco Cessation No Change    Warm-up and Cool-down Performed on first and last piece of equipment    Resistance Training Performed Yes    VAD Patient? No    PAD/SET Patient? No      Pain Assessment   Currently in Pain? No/denies                Social History   Tobacco Use  Smoking Status Every Day   Packs/day: 1.00   Years: 25.00   Pack years: 25.00   Types: Cigarettes   Last attempt to quit: 07/11/2020   Years since quitting: 0.7  Smokeless Tobacco Never  Tobacco Comments   02/03/21 currently at 1 ppd, [facility] and [facility] is [facility] it    Goals Met:  Independence with exercise equipment Exercise tolerated well No report of cardiac concerns or symptoms Strength training completed today  Goals Unmet:  Not Applicable  Comments: Pt able to follow exercise prescription today without complaint.  Will continue to monitor for progression.    Dr. Emily Filbert is Medical Director for [place].  Dr. Ottie Glazier is Medical Director for [facility] Pulmonary Rehabilitation.

## 2021-03-26 ENCOUNTER — Encounter: Payer: Self-pay | Admitting: *Deleted

## 2021-03-26 DIAGNOSIS — J449 Chronic obstructive pulmonary disease, unspecified: Secondary | ICD-10-CM

## 2021-03-26 NOTE — Progress Notes (Signed)
Pulmonary Individual Treatment Plan  Patient Details  Name: Robert Combs MRN: [id number] Date of Birth: Nov 18, 1945 Referring Provider:   Doristine Devoid Pulmonary Rehab from 02/03/2021 in [facility]  Referring Provider Marcelyn Bruins MD  [facility]       Initial Encounter Date:  Flowsheet Row Pulmonary Rehab from 02/03/2021 in [facility] Card[facility] and Pulmonary Rehab  Date 02/03/21       Visit Diagnosis: Chronic obstructive pulmonary disease, unspecified COPD type (HCC)  Patient's Home Medications on Admission:  Current Outpatient Medications:    albuterol (PROVENTIL HFA;VENTOLIN HFA) 108 (90 Base) MCG/ACT inhaler, Inhale 1 puff into the lungs every 6 (six) hours as needed for wheezing or shortness of breath., Disp: , Rfl:    albuterol (VENTOLIN HFA) 108 (90 Base) MCG/ACT inhaler, Inhale into the lungs., Disp: , Rfl:    Alogliptin Benzoate 25 MG TABS, Take by mouth., Disp: , Rfl:    Ascorbic Acid (VITAMIN C) 100 MG tablet, Take 100 mg by mouth daily. (Patient not taking: Reported on 01/24/2021), Disp: , Rfl:    aspirin EC 81 MG tablet, Take 81 mg by mouth daily.  , Disp: , Rfl:    benzonatate (TESSALON PERLES) 100 MG capsule, Take 1 capsule (100 mg total) by mouth every 6 (six) hours as needed for cough. (Patient not taking: Reported on 01/24/2021), Disp: 30 capsule, Rfl: 0   budesonide-formoterol (SYMBICORT) 160-4.5 MCG/ACT inhaler, Inhale into the lungs., Disp: , Rfl:    cholecalciferol (VITAMIN D) 1000 UNITS tablet, Take 1,000 Units by mouth daily.  , Disp: , Rfl:    losartan (COZAAR) 50 MG tablet, Take 25 mg by mouth daily., Disp: , Rfl:    metFORMIN (GLUCOPHAGE) 500 MG tablet, Take 1 tablet (500 mg) PO daily x 1 week, then increase to 1 tablet PO BID thereafter, Disp: 60 tablet, Rfl: 0   mometasone (ASMANEX) 220 MCG/INH inhaler, Take by mouth., Disp: , Rfl:    omeprazole (PRILOSEC OTC) 20 MG tablet, Take 20 mg by mouth daily., Disp: , Rfl:    traMADol (ULTRAM)  50 MG tablet, Take 1 tablet (50 mg total) by mouth every 6 (six) hours as needed., Disp: 10 tablet, Rfl: 0  Past Medical History: Past Medical History:  Diagnosis Date   COPD (chronic obstructive pulmonary disease) (HCC)    Coronary artery disease    DDD (degenerative disc disease)    Hypertension     Tobacco Use: Social History   Tobacco Use  Smoking Status Every Day   Packs/day: 1.00   Years: 25.00   Pack years: 25.00   Types: Cigarettes   Last attempt to quit: 07/11/2020   Years since quitting: 0.7  Smokeless Tobacco Never  Tobacco Comments   02/03/21 currently at 1 ppd, [facility] is [facility]    Labs: Recent Review Flowsheet Data     Labs for ITP Cardiac and Pulmonary Rehab Latest Ref Rng & Units 02/03/2010 11/04/2015   HCO3 20.0 - 24.0 mEq/L - 26.3(H)   TCO2 0 - 100 mmol/L 31 28   O2SAT % - 75.0        Pulmonary Assessment Scores:  Pulmonary Assessment Scores     Row Name 02/03/21 1328         ADL UCSD   ADL Phase Entry     SOB Score total 57     Rest 3     Walk 4     Stairs 5  Bath 0     Dress 0     Shop 2           CAT Score     CAT Score 37           mMRC Score     mMRC Score 0             UCSD: Self-administered rating of dyspnea associated with activities of daily living (ADLs) 6-point scale (0 = "not at all" to 5 = "maximal or unable to do because of breathlessness")  Scoring Scores range from 0 to 120.  Minimally important difference is 5 units  CAT: CAT can identify the health impairment of COPD patients and is better correlated with disease progression.  CAT has a scoring range of zero to 40. The CAT score is classified into four groups of low (less than 10), medium (10 - 20), high (21-30) and very high (31-40) based on the impact level of disease on health status. A CAT score over 10 suggests significant symptoms.  A worsening CAT score could be explained by an exacerbation, poor medication adherence,  poor inhaler technique, or progression of COPD or comorbid conditions.  CAT MCID is 2 points  mMRC: mMRC (Modified Medical Research Council) Dyspnea Scale is used to assess the degree of baseline functional disability in patients of respiratory disease due to dyspnea. No minimal important difference is established. A decrease in score of 1 point or greater is considered a positive change.   Pulmonary Function Assessment:  Pulmonary Function Assessment - 02/03/21 1327       Breath   Shortness of Breath Yes;Fear of Shortness of Breath;Limiting activity             Exercise Target Goals: Exercise Program Goal: Individual exercise prescription set using results from initial 6 min walk test and THRR while considering  patient's activity barriers and safety.   Exercise Prescription Goal: Initial exercise prescription builds to 30-45 minutes a day of aerobic activity, 2-3 days per week.  Home exercise guidelines will be given to patient during program as part of exercise prescription that the participant will acknowledge.  Education: Aerobic Exercise: - Group verbal and visual presentation on the components of exercise prescription. Introduces F.I.T.T principle from ACSM for exercise prescriptions.  Reviews F.I.T.T. principles of aerobic exercise including progression. Written material given at graduation. Flowsheet Row Pulmonary Rehab from 03/06/2021 in [facility] Pulmonary Rehab  Date 02/06/21  Educator [facility]  Instruction Review Code 1- Verbalizes Understanding       Education: Resistance Exercise: - Group verbal and visual presentation on the components of exercise prescription. Introduces F.I.T.T principle from ACSM for exercise prescriptions  Reviews F.I.T.T. principles of resistance exercise including progression. Written material given at graduation.    Education: Exercise & Equipment Safety: - Individual verbal instruction and demonstration of equipment use and safety  with use of the equipment. Flowsheet Row Pulmonary Rehab from 03/06/2021 in [facility] Cardiac and Pulmonary Rehab  Date 02/03/21  Educator [facility]  Instruction Review Code 1- Verbalizes Understanding       Education: Exercise Physiology & General Exercise Guidelines: - Group verbal and written instruction with models to review the exercise physiology of the cardiovascular system and associated critical values. Provides general exercise guidelines with specific guidelines to those with heart or lung disease.    Education: Flexibility, Balance, Mind/Body Relaxation: - Group verbal and visual presentation with interactive activity on the components of exercise prescription. Introduces F.I.T.T principle from ACSM  for exercise prescriptions. Reviews F.I.T.T. principles of flexibility and balance exercise training including progression. Also discusses the mind body connection.  Reviews various relaxation techniques to help reduce and manage stress (i.e. Deep breathing, progressive muscle relaxation, and visualization). Balance handout provided to take home. Written material given at graduation. Flowsheet Row Pulmonary Rehab from 03/06/2021 in [facility] Cardiac and Pulmonary Rehab  Date 02/20/21  Educator [facility]  Instruction Review Code 1- Verbalizes Understanding       Activity Barriers & Risk Stratification:  Activity Barriers & Cardiac Risk Stratification - 02/03/21 1154       Activity Barriers & Cardiac Risk Stratification   Activity Barriers Shortness of Breath;Joint Problems;Deconditioning;Muscular Weakness;Balance Concerns             6 Minute Walk:  6 Minute Walk     Row Name 02/03/21 1152         6 Minute Walk   Phase Initial     Distance 1380 feet     Walk Time 6 minutes     # of Rest Breaks 0     MPH 2.61     METS 2.72     RPE 11     Perceived Dyspnea  3     VO2 Peak 9.51     Symptoms Yes (comment)     Comments legs burning, SOB     Resting HR 60 bpm     Resting BP 122/62      Resting Oxygen Saturation  94 %     Exercise Oxygen Saturation  during 6 min walk 91 %     Max Ex. HR 83 bpm     Max Ex. BP 156/74     2 Minute Post BP 132/70           Interval HR     1 Minute HR 59     2 Minute HR 66     3 Minute HR 67     4 Minute HR 82     5 Minute HR 83     6 Minute HR 70     2 Minute Post HR 65     Interval Heart Rate? Yes           Interval Oxygen     Interval Oxygen? Yes     Baseline Oxygen Saturation % 94 %     1 Minute Oxygen Saturation % 93 %     1 Minute Liters of Oxygen 0 L  Room Air     2 Minute Oxygen Saturation % 92 %     2 Minute Liters of Oxygen 0 L     3 Minute Oxygen Saturation % 91 %     3 Minute Liters of Oxygen 0 L     4 Minute Oxygen Saturation % 92 %     4 Minute Liters of Oxygen 0 L     5 Minute Oxygen Saturation % 91 %     5 Minute Liters of Oxygen 0 L     6 Minute Oxygen Saturation % 91 %     6 Minute Liters of Oxygen 0 L     2 Minute Post Oxygen Saturation % 96 %     2 Minute Post Liters of Oxygen 0 L            Oxygen Initial Assessment:  Oxygen Initial Assessment - 02/03/21 1327       Home Oxygen   Home Oxygen Device None  Sleep Oxygen Prescription None    Home Exercise Oxygen Prescription None    Home Resting Oxygen Prescription None      Initial 6 min Walk   Oxygen Used None      Program Oxygen Prescription   Program Oxygen Prescription None      Intervention   Short Term Goals To learn and demonstrate proper pursed lip breathing techniques or other breathing techniques. ;To learn and demonstrate proper use of respiratory medications;To learn and understand importance of maintaining oxygen saturations>88%;To learn and understand importance of monitoring SPO2 with pulse oximeter and demonstrate accurate use of the pulse oximeter.    Long  Term Goals Exhibits proper breathing techniques, such as pursed lip breathing or other method taught during program session;Compliance with respiratory  medication;Maintenance of O2 saturations>88%;Verbalizes importance of monitoring SPO2 with pulse oximeter and return demonstration             Oxygen Re-Evaluation:  Oxygen Re-Evaluation     Row Name 02/04/21 0718 03/04/21 0737           Program Oxygen Prescription   Program Oxygen Prescription None None             Home Oxygen      Home Oxygen Device None None      Sleep Oxygen Prescription None None      Home Exercise Oxygen Prescription None None      Home Resting Oxygen Prescription None None      Compliance with Home Oxygen Use Yes --             Goals/Expected Outcomes      Short Term Goals To learn and demonstrate proper pursed lip breathing techniques or other breathing techniques. ;To learn and understand importance of maintaining oxygen saturations>88%;To learn and understand importance of monitoring SPO2 with pulse oximeter and demonstrate accurate use of the pulse oximeter. To learn and demonstrate proper pursed lip breathing techniques or other breathing techniques. ;To learn and understand importance of maintaining oxygen saturations>88%;To learn and understand importance of monitoring SPO2 with pulse oximeter and demonstrate accurate use of the pulse oximeter.      Long  Term Goals Exhibits proper breathing techniques, such as pursed lip breathing or other method taught during program session;Compliance with respiratory medication;Maintenance of O2 saturations>88%;Verbalizes importance of monitoring SPO2 with pulse oximeter and return demonstration Exhibits proper breathing techniques, such as pursed lip breathing or other method taught during program session;Compliance with respiratory medication;Maintenance of O2 saturations>88%;Verbalizes importance of monitoring SPO2 with pulse oximeter and return demonstration      Comments Reviewed PLB technique with pt.  Talked about how it works and it's importance in maintaining their exercise saturations. Robert Combs is doing well  in rehab. His breathing is getting better, but he is stil smoking and his chest feels tight.  We talked about quitting smoking to help. He does use his PLB with exercise.      Goals/Expected Outcomes Short: Become more profiecient at using PLB.   Long: Become independent at using PLB. short: Continue to work on breathing Long: Use PLB more frequently              Oxygen Discharge (Final Oxygen Re-Evaluation):  Oxygen Re-Evaluation - 03/04/21 0737       Program Oxygen Prescription   Program Oxygen Prescription None      Home Oxygen   Home Oxygen Device None    Sleep Oxygen Prescription None    Home Exercise Oxygen Prescription None  Home Resting Oxygen Prescription None      Goals/Expected Outcomes   Short Term Goals To learn and demonstrate proper pursed lip breathing techniques or other breathing techniques. ;To learn and understand importance of maintaining oxygen saturations>88%;To learn and understand importance of monitoring SPO2 with pulse oximeter and demonstrate accurate use of the pulse oximeter.    Long  Term Goals Exhibits proper breathing techniques, such as pursed lip breathing or other method taught during program session;Compliance with respiratory medication;Maintenance of O2 saturations>88%;Verbalizes importance of monitoring SPO2 with pulse oximeter and return demonstration    Comments Robert Combs is doing well in rehab. His breathing is getting better, but he is stil smoking and his chest feels tight.  We talked about quitting smoking to help. He does use his PLB with exercise.    Goals/Expected Outcomes short: Continue to work on breathing Long: Use PLB more frequently             Initial Exercise Prescription:  Initial Exercise Prescription - 02/03/21 1300       Date of Initial Exercise RX and Referring Provider   Date 02/03/21    Referring Provider Marcelyn Bruins MD   [facility]     Treadmill   MPH 2.2    Grade 0.5    Minutes 15    METs 2.84       Recumbant Bike   Level 2    RPM 50    Watts 22    Minutes 15    METs 2.6      Biostep-RELP   Level 2    SPM 50    Minutes 15    METs 2      Track   Laps 30    Minutes 15    METs 2.6      Prescription Details   Frequency (times per week) 2    Duration Progress to 30 minutes of continuous aerobic without signs/symptoms of physical distress      Intensity   THRR 40-80% of Max Heartrate 95-130    Ratings of Perceived Exertion 11-13    Perceived Dyspnea 0-4      Progression   Progression Continue to progress workloads to maintain intensity without signs/symptoms of physical distress.      Resistance Training   Training Prescription Yes    Weight 4 lb    Reps 10-15             Perform Capillary Blood Glucose checks as needed.  Exercise Prescription Changes:   Exercise Prescription Changes     Row Name 02/03/21 1300 02/18/21 1500 03/05/21 1700 03/18/21 0700       Response to Exercise   Blood Pressure (Admit) 122/62 98/54 110/64 128/64    Blood Pressure (Exercise) 156/74 120/60 138/66 132/62    Blood Pressure (Exit) 110/66 112/60 124/64 112/66    Heart Rate (Admit) 60 bpm 65 bpm 58 bpm 88 bpm    Heart Rate (Exercise) 83 bpm 94 bpm 96 bpm 93 bpm    Heart Rate (Exit) 62 bpm 61 bpm 66 bpm 80 bpm    Oxygen Saturation (Admit) 94 % 89 % 94 % 94 %    Oxygen Saturation (Exercise) 91 % 90 % 93 % 91 %    Oxygen Saturation (Exit) 94 % 95 % 95 % 94 %    Rating of Perceived Exertion (Exercise) 11 13 13 13     Perceived Dyspnea (Exercise) 3 0 2 0    Symptoms SOB, legs burning  none -- none    Comments walk test results -- -- --    Duration -- Progress to 30 minutes of  aerobic without signs/symptoms of physical distress Continue with 30 min of aerobic exercise without signs/symptoms of physical distress. Continue with 30 min of aerobic exercise without signs/symptoms of physical distress.    Intensity -- THRR unchanged THRR unchanged THRR unchanged         Progression         Progression -- Continue to progress workloads to maintain intensity without signs/symptoms of physical distress. Continue to progress workloads to maintain intensity without signs/symptoms of physical distress. Continue to progress workloads to maintain intensity without signs/symptoms of physical distress.    Average METs -- 2.4 2.2 3.02         Resistance Training        Training Prescription -- Yes Yes Yes    Weight -- 4 lb 5 lb 5 lb    Reps -- 10-15 10-15 10-15         Interval Training        Interval Training -- No No No         Treadmill        MPH -- 2 -- 2.5    Grade -- 0 -- 0.5    Minutes -- 15 -- 15    METs -- 2.53 -- 3.09         Recumbant Bike        Level -- 2 -- 5    Watts -- 19 -- 33    Minutes -- 15 -- 15    METs -- 2.66 -- 3.1         Biostep-RELP        Level -- 2 2 --    Minutes -- 15 15 --    METs -- 2 2 --         Track        Laps -- 27 27 --    Minutes -- 15 15 --    METs -- 2.47 2.47 --            Exercise Comments:   Exercise Comments     Row Name 02/04/21 0717           Exercise Comments First full day of exercise!  Patient was oriented to gym and equipment including functions, settings, policies, and procedures.  Patient's individual exercise prescription and treatment plan were reviewed.  All starting workloads were established based on the results of the 6 minute walk test done at initial orientation visit.  The plan for exercise progression was also introduced and progression will be customized based on patient's performance and goals.                Exercise Goals and Review:   Exercise Goals     Row Name 02/03/21 1319             Exercise Goals   Increase Physical Activity Yes       Intervention Provide advice, education, support and counseling about physical activity/exercise needs.;Develop an individualized exercise prescription for aerobic and resistive training based on initial evaluation findings, risk  stratification, comorbidities and participant's personal goals.       Expected Outcomes Short Term: Attend rehab on a regular basis to increase amount of physical activity.;Long Term: Add in home exercise to make exercise part of routine and to increase amount of physical activity.;Long Term: Exercising regularly at  least 3-5 days a week.       Increase Strength and Stamina Yes       Intervention Provide advice, education, support and counseling about physical activity/exercise needs.;Develop an individualized exercise prescription for aerobic and resistive training based on initial evaluation findings, risk stratification, comorbidities and participant's personal goals.       Expected Outcomes Short Term: Increase workloads from initial exercise prescription for resistance, speed, and METs.;Short Term: Perform resistance training exercises routinely during rehab and add in resistance training at home;Long Term: Improve cardiorespiratory fitness, muscular endurance and strength as measured by increased METs and functional capacity ( )       Able to understand and use rate of perceived exertion (RPE) scale Yes       Intervention Provide education and explanation on how to use RPE scale       Expected Outcomes Short Term: Able to use RPE daily in rehab to express subjective intensity level;Long Term:  Able to use RPE to guide intensity level when exercising independently       Able to understand and use Dyspnea scale Yes       Intervention Provide education and explanation on how to use Dyspnea scale       Expected Outcomes Short Term: Able to use Dyspnea scale daily in rehab to express subjective sense of shortness of breath during exertion;Long Term: Able to use Dyspnea scale to guide intensity level when exercising independently       Knowledge and understanding of Target Heart Rate Range (THRR) Yes       Intervention Provide education and explanation of THRR including how the numbers were predicted  and where they are located for reference       Expected Outcomes Short Term: Able to use daily as guideline for intensity in rehab;Short Term: Able to state/look up THRR;Long Term: Able to use THRR to govern intensity when exercising independently       Able to check pulse independently Yes       Intervention Provide education and demonstration on how to check pulse in carotid and radial arteries.;Review the importance of being able to check your own pulse for safety during independent exercise       Expected Outcomes Short Term: Able to explain why pulse checking is important during independent exercise;Long Term: Able to check pulse independently and accurately       Understanding of Exercise Prescription Yes       Intervention Provide education, explanation, and written materials on patient's individual exercise prescription       Expected Outcomes Short Term: Able to explain program exercise prescription;Long Term: Able to explain home exercise prescription to exercise independently                Exercise Goals Re-Evaluation :  Exercise Goals Re-Evaluation     Row Name 02/04/21 0717 02/18/21 1509 03/04/21 0746 03/18/21 0749       Exercise Goal Re-Evaluation   Exercise Goals Review Knowledge and understanding of Target Heart Rate Range (THRR);Increase Physical Activity;Able to understand and use rate of perceived exertion (RPE) scale;Understanding of Exercise Prescription;Able to understand and use Dyspnea scale;Able to check pulse independently;Increase Strength and Stamina Increase Physical Activity;Increase Strength and Stamina;Understanding of Exercise Prescription Increase Physical Activity;Increase Strength and Stamina;Understanding of Exercise Prescription;Able to understand and use rate of perceived exertion (RPE) scale;Able to understand and use Dyspnea scale;Knowledge and understanding of Target Heart Rate Range (THRR);Able to check pulse independently Increase Physical  Activity;Increase Strength  and Stamina;Understanding of Exercise Prescription    Comments Reviewed RPE and dyspnea scales, THR and program prescription with pt today.  Pt voiced understanding and was given a copy of goals to take home. Robert Combs is off to a good start in rehab.  He has only completed his first 4 full days of exercise thus far.  He is at 2.0 mph on the treadmill.  We will continue to monitor his progress and encourage good attendance. Robert Combs is doing well in rehab.  He is have some symptoms of depression and we talked about exercising can be helpful.  Reviewed home exercise with pt today.  Pt plans to walk at home and use staff videos for exercise.  Reviewed THR, pulse, RPE, sign and symptoms, pulse oximetery and when to call 911 or MD.  Also discussed weather considerations and indoor options.  Pt voiced understanding. Robert Combs does not have the most consistent attendance due to his work that takes him out of town frequently.  He does do well in rehab when present.  He is up to level 5 on the recumbent bike.  We continue to monitor his progress.    Expected Outcomes Short: Use RPE daily to regulate intensity. Long: Follow program prescription in THR. Short: Attend rehab regularly Long: Continue to improve stamina. Short: get into routine with exercise Long: Continue to improve stamina Short: walk when out of town to maintain Long: Conitnue to improve stamina.             Discharge Exercise Prescription (Final Exercise Prescription Changes):  Exercise Prescription Changes - 03/18/21 0700       Response to Exercise   Blood Pressure (Admit) 128/64    Blood Pressure (Exercise) 132/62    Blood Pressure (Exit) 112/66    Heart Rate (Admit) 88 bpm    Heart Rate (Exercise) 93 bpm    Heart Rate (Exit) 80 bpm    Oxygen Saturation (Admit) 94 %    Oxygen Saturation (Exercise) 91 %    Oxygen Saturation (Exit) 94 %    Rating of Perceived Exertion (Exercise) 13    Perceived Dyspnea (Exercise) 0     Symptoms none    Duration Continue with 30 min of aerobic exercise without signs/symptoms of physical distress.    Intensity THRR unchanged      Progression   Progression Continue to progress workloads to maintain intensity without signs/symptoms of physical distress.    Average METs 3.02      Resistance Training   Training Prescription Yes    Weight 5 lb    Reps 10-15      Interval Training   Interval Training No      Treadmill   MPH 2.5    Grade 0.5    Minutes 15    METs 3.09      Recumbant Bike   Level 5    Watts 33    Minutes 15    METs 3.1             Nutrition:  Target Goals: Understanding of nutrition guidelines, daily intake of sodium 1500mg , cholesterol 200mg , calories 30% from fat and 7% or less from saturated fats, daily to have 5 or more servings of fruits and vegetables.  Education: All About Nutrition: -Group instruction provided by verbal, written material, interactive activities, discussions, models, and posters to present general guidelines for heart healthy nutrition including fat, fiber, MyPlate, the role of sodium in heart healthy nutrition, utilization of the nutrition label, and  utilization of this knowledge for meal planning. Follow up email sent as well. Written material given at graduation.   Biometrics:  Pre Biometrics - 02/03/21 1320       Pre Biometrics   Height 5' 10.1" (1.781 m)    Weight 213 lb 9.6 oz (96.9 kg)    BMI (Calculated) 30.55    Single Leg Stand 6.5 seconds              Nutrition Therapy Plan and Nutrition Goals:  Nutrition Therapy & Goals - 02/05/21 0948       Nutrition Therapy   Diet Heart healthy, low Na, diabetes friendly    Protein (specify units) 110-115g    Fiber 30 grams    Whole Grain Foods 3 servings    Saturated Fats 12 max. grams    Fruits and Vegetables 8 servings/day    Sodium 1.5 grams      Personal Nutrition Goals   Nutrition Goal ST: add mid morning snakc to avoid probable low BG  and intense hunger, check BG at least 1x/day. LT: maintain BG during the day, choose healthier options that follow MyPlate stucture when eating out for lunch.    Comments He doesn't normally check his BG at home. His MD said his BG was good when he was last in per [place]. He reports no shortness of breath during or after eating. He reports that his appetite is lower than used to be. 221lb--> 214lbs in 3 weeks and feels the difference in his clothes. He feels his appetite was the same over this time. B: weekend: bacon and egg sandwich (1/2) L: go out: vegetables and meat or sandwiches (hamburger, hot dog, bolonga sandwich) D: cabbage and green beans, baked chicken, pork chop, corn, potatoes -baked, spaghetti with meat sauce and salad (not fond of pasta), he does not like rice, uses butter and olive oil, he reports not liking salt so she will use a little bit of salt - girlfriend cooks. He reports waiting until he gets "extremely hungry" to eat. He feels hunger when his legs get weak or he has stomach pains. He feels it's hard for him to get balanced meal at work when he goes out for lunch. We will start with a mid morning snack to avoid extreme hunger and probable low BG and then move to modifying lunch. Discussed importance of checking BG - he reports having all the tools to check and what to do for low BG. Discussed heart healthy eaitng, diabetes friendly eating, and COPD friendly eating.      Intervention Plan   Intervention Prescribe, educate and counsel regarding individualized specific dietary modifications aiming towards targeted core components such as weight, hypertension, lipid management, diabetes, heart failure and other comorbidities.    Expected Outcomes Short Term Goal: Understand basic principles of dietary content, such as calories, fat, sodium, cholesterol and nutrients.;Short Term Goal: A plan has been developed with personal nutrition goals set during dietitian appointment.;Long Term Goal:  Adherence to prescribed nutrition plan.             Nutrition Assessments:  MEDIFICTS Score Key: ?70 Need to make dietary changes  40-70 Heart Healthy Diet ? 40 Therapeutic Level Cholesterol Diet  Flowsheet Row Pulmonary Rehab from 02/03/2021 in [facility] Card[facility] and Pulmonary Rehab  Picture Your Plate Total Score on Admission 51      Picture Your Plate Scores: <16 Unhealthy dietary pattern with much room for improvement. 41-50 Dietary pattern unlikely to meet recommendations for good  health and room for improvement. 51-60 More healthful dietary pattern, with some room for improvement.  >60 Healthy dietary pattern, although there may be some specific behaviors that could be improved.   Nutrition Goals Re-Evaluation:  Nutrition Goals Re-Evaluation     Row Name 03/04/21 0734             Goals   Nutrition Goal ST: add mid morning snakc to avoid probable low BG and intense hunger, check BG at least 1x/day. LT: maintain BG during the day, choose healthier options that follow MyPlate stucture when eating out for lunch.       Comment Robert Combs has not had a lot of appetite.  He is eating once a day.  He has not been checking his sugars like he should.  We talked about adding in a protein shake to help. Continue to try to make a good options.       Expected Outcome Short: Try to eat more frequently and try protein shake Long: Continue to make healthy options.                Nutrition Goals Discharge (Final Nutrition Goals Re-Evaluation):  Nutrition Goals Re-Evaluation - 03/04/21 0734       Goals   Nutrition Goal ST: add mid morning snakc to avoid probable low BG and intense hunger, check BG at least 1x/day. LT: maintain BG during the day, choose healthier options that follow MyPlate stucture when eating out for lunch.    Comment Robert Combs has not had a lot of appetite.  He is eating once a day.  He has not been checking his sugars like he should.  We talked about adding in a protein  shake to help. Continue to try to make a good options.    Expected Outcome Short: Try to eat more frequently and try protein shake Long: Continue to make healthy options.             Psychosocial: Target Goals: Acknowledge presence or absence of significant depression and/or stress, maximize coping skills, provide positive support system. Participant is able to verbalize types and ability to use techniques and skills needed for reducing stress and depression.   Education: Stress, Anxiety, and Depression - Group verbal and visual presentation to define topics covered.  Reviews how body is impacted by stress, anxiety, and depression.  Also discusses healthy ways to reduce stress and to treat/manage anxiety and depression.  Written material given at graduation.   Education: Sleep Hygiene -Provides group verbal and written instruction about how sleep can affect your health.  Define sleep hygiene, discuss sleep cycles and impact of sleep habits. Review good sleep hygiene tips.    Initial Review & Psychosocial Screening:  Initial Psych Review & Screening - 01/24/21 1541       Initial Review   Current issues with Current Stress Concerns    Source of Stress Concerns Family    Comments children stress him. he as MD psychiatrist at [facility]. 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      Family Dynamics   Good Support System? Yes   2 daughters, one son (one in [place] calls everyday other in [place]) and a sister.  .     Barriers   Psychosocial barriers to participate in program There are no identifiable barriers or psychosocial needs.;The patient should benefit from training in stress management and relaxation.      Screening Interventions   Interventions Provide feedback about the scores to participant;To provide support and resources  with identified psychosocial needs;Encouraged to exercise    Expected Outcomes Short Term goal: Utilizing psychosocial counselor, staff and physician to assist with  identification of specific Stressors or current issues interfering with healing process. Setting desired goal for each stressor or current issue identified.;Long Term Goal: Stressors or current issues are controlled or eliminated.;Short Term goal: Identification and review with participant of any Quality of Life or Depression concerns found by scoring the questionnaire.;Long Term goal: The participant improves quality of Life and PHQ9 Scores as seen by post scores and/or verbalization of changes             Quality of Life Scores:  Scores of 19 and below usually indicate a poorer quality of life in these areas.  A difference of  2-3 points is a clinically meaningful difference.  A difference of 2-3 points in the total score of the Quality of Life Index has been associated with significant improvement in overall quality of life, self-image, physical symptoms, and general health in studies assessing change in quality of life.  PHQ-9: Recent Review Flowsheet Data     Depression screen [facility] 2/9 03/04/2021 02/03/2021   Decreased Interest 1 3   Down, Depressed, Hopeless 1 2   PHQ - 2 Score 2 5   Altered sleeping 1  3   Tired, decreased energy 1 3   Change in appetite 3 3   Feeling bad or failure about yourself  0 0   Trouble concentrating 0 0   Moving slowly or fidgety/restless 0 0   Suicidal thoughts 0 0   PHQ-9 Score 7 14   Difficult doing work/chores Not difficult at all Not difficult at all      Interpretation of Total Score  Total Score Depression Severity:  1-4 = Minimal depression, 5-9 = Mild depression, 10-14 = Moderate depression, 15-19 = Moderately severe depression, 20-27 = Severe depression   Psychosocial Evaluation and Intervention:  Psychosocial Evaluation - 01/24/21 1601       Psychosocial Evaluation & Interventions   Interventions Encouraged to exercise with the program and follow exercise prescription    Comments Shigeru has no barriers to attending the program. He  does travel for work and may need to flex his schedule. He lives alone and has children in the area and a girlfriend. He does get stressed and has a psychiatrist at the [place] that he talks to. He is a smoker and is trting to quit. HIs support poepl are his family. He wants to improve his breathing,have more energy and quit smoking.    Expected Outcomes STG Robert Combs is able to attend at least 2 days aweek depite his work schedule. He is able to Quit tobacco. LTG: Robert Combs has quit and maintains tobacco cessation, he is able to continue any progress he made during the program.    Continue Psychosocial Services  Follow up required by staff             Psychosocial Re-Evaluation:  Psychosocial Re-Evaluation     Row Name 03/04/21 0725             Psychosocial Re-Evaluation   Current issues with Current Depression;Current Sleep Concerns;History of Depression;Current Stress Concerns       Comments Robert Combs is doing well in rehab.  We did his PHQ test again and his scores are better, but still feeling depressed.  He is seeing the pyschiatrist at [place].  He has nights where he doesn't sleep well and that has been going on for  more than 6 months but he has not talked to doctor about any of this. He was encouraged to talk with his doctor to help with sleep. His lack of sleep is making him feel tired.       Expected Outcomes SHort: Talk to doctor about sleep and depression Long: Continue to find the positive.       Interventions Stress management education;Encouraged to attend Cardiac Rehabilitation for the exercise       Continue Psychosocial Services  Follow up required by staff                Psychosocial Discharge (Final Psychosocial Re-Evaluation):  Psychosocial Re-Evaluation - 03/04/21 0725       Psychosocial Re-Evaluation   Current issues with Current Depression;Current Sleep Concerns;History of Depression;Current Stress Concerns    Comments Robert Combs is doing well in rehab.  We did his PHQ test  again and his scores are better, but still feeling depressed.  He is seeing the pyschiatrist at [place].  He has nights where he doesn't sleep well and that has been going on for more than 6 months but he has not talked to doctor about any of this. He was encouraged to talk with his doctor to help with sleep. His lack of sleep is making him feel tired.    Expected Outcomes SHort: Talk to doctor about sleep and depression Long: Continue to find the positive.    Interventions Stress management education;Encouraged to attend Cardiac Rehabilitation for the exercise    Continue Psychosocial Services  Follow up required by staff             Education: Education Goals: Education classes will be provided on a weekly basis, covering required topics. Participant will state understanding/return demonstration of topics presented.  Learning Barriers/Preferences:  Learning Barriers/Preferences - 01/24/21 1546       Learning Barriers/Preferences   Learning Barriers None    Learning Preferences None             General Pulmonary Education Topics:  Infection Prevention: - Provides verbal and written material to individual with discussion of infection control including proper hand washing and proper equipment cleaning during exercise session. Flowsheet Row Pulmonary Rehab from 03/06/2021 in [facility] Cardiac and Pulmonary Rehab  Date 02/03/21  Educator [facility]  Instruction Review Code 1- Verbalizes Understanding       Falls Prevention: - Provides verbal and written material to individual with discussion of falls prevention and safety. Flowsheet Row Pulmonary Rehab from 03/06/2021 in [facility] Cardiac and Pulmonary Rehab  Date 02/03/21  Educator [facility]  Instruction Review Code 1- Verbalizes Understanding       Chronic Lung Disease Review: - Group verbal instruction with posters, models, PowerPoint presentations and videos,  to review new updates, new respiratory medications, new advancements in procedures and  treatments. Providing information on websites and "800" numbers for continued self-education. Includes information about supplement oxygen, available portable oxygen systems, continuous and intermittent flow rates, oxygen safety, concentrators, and Medicare reimbursement for oxygen. Explanation of Pulmonary Drugs, including class, frequency, complications, importance of spacers, rinsing mouth after steroid MDI's, and proper cleaning methods for nebulizers. Review of basic lung anatomy and physiology related to function, structure, and complications of lung disease. Review of risk factors. Discussion about methods for diagnosing sleep apnea and types of masks and machines for OSA. Includes a review of the use of types of environmental controls: home humidity, furnaces, filters, dust mite/pet prevention, HEPA vacuums. Discussion about weather changes, air quality and  the benefits of nasal washing. Instruction on Warning signs, infection symptoms, calling MD promptly, preventive modes, and value of vaccinations. Review of effective airway clearance, coughing and/or vibration techniques. Emphasizing that all should Create an Action Plan. Written material given at graduation. Flowsheet Row Pulmonary Rehab from 03/06/2021 in [facility] Cardiac and Pulmonary Rehab  Education need identified 02/03/21       AED/CPR: - Group verbal and written instruction with the use of models to demonstrate the basic use of the AED with the basic ABC's of resuscitation.    Anatomy and Cardiac Procedures: - Group verbal and visual presentation and models provide information about basic cardiac anatomy and function. Reviews the testing methods done to diagnose heart disease and the outcomes of the test results. Describes the treatment choices: Medical Management, Angioplasty, or Coronary Bypass Surgery for treating various heart conditions including Myocardial Infarction, Angina, Valve Disease, and Cardiac Arrhythmias.  Written material  given at graduation.   Medication Safety: - Group verbal and visual instruction to review commonly prescribed medications for heart and lung disease. Reviews the medication, class of the drug, and side effects. Includes the steps to properly store meds and maintain the prescription regimen.  Written material given at graduation. Flowsheet Row Pulmonary Rehab from 03/06/2021 in [facility] Cardiac and Pulmonary Rehab  Date 03/06/21  Educator [facility]  Instruction Review Code 1- Verbalizes Understanding       Other: -Provides group and verbal instruction on various topics (see comments)   Knowledge Questionnaire Score:  Knowledge Questionnaire Score - 02/03/21 1323       Knowledge Questionnaire Score   Pre Score 13/16 Education Focus: Chonic Lung Disease, O2 safety              Core Components/Risk Factors/Patient Goals at Admission:  Personal Goals and Risk Factors at Admission - 02/03/21 1323       Core Components/Risk Factors/Patient Goals on Admission    Weight Management Yes;Weight Maintenance;Obesity    Intervention Weight Management: Provide education and appropriate resources to help participant work on and attain dietary goals.;Weight Management: Develop a combined nutrition and exercise program designed to reach desired caloric intake, while maintaining appropriate intake of nutrient and fiber, sodium and fats, and appropriate energy expenditure required for the weight goal.;Weight Management/Obesity: Establish reasonable short term and long term weight goals.;Obesity: Provide education and appropriate resources to help participant work on and attain dietary goals.    Admit Weight 213 lb 9.6 oz (96.9 kg)    Goal Weight: Short Term 208 lb (94.3 kg)    Goal Weight: Long Term 200 lb (90.7 kg)    Expected Outcomes Short Term: Continue to assess and modify interventions until short term weight is achieved;Long Term: Adherence to nutrition and physical activity/exercise program aimed  toward attainment of established weight goal;Weight Loss: Understanding of general recommendations for a balanced deficit meal plan, which promotes 1-2 lb weight loss per week and includes a negative energy balance of [phone] kcal/d;Understanding recommendations for meals to include 15-35% energy as protein, 25-35% energy from fat, 35-60% energy from carbohydrates, less than 200mg  of dietary cholesterol, 20-35 gm of total fiber daily;Understanding of distribution of calorie intake throughout the day with the consumption of 4-5 meals/snacks    Tobacco Cessation Yes    Number of packs per day 1 per day    Intervention Assist the participant in steps to quit. Provide individualized education and counseling about committing to Tobacco Cessation, relapse prevention, and pharmacological support that can be provided by physician.;Offer  self-teaching materials, assist with locating and accessing local/national Quit Smoking programs, and support quit date choice.    Expected Outcomes Short Term: Will demonstrate readiness to quit, by selecting a quit date.;Short Term: Will quit all tobacco product use, adhering to prevention of relapse plan.;Long Term: Complete abstinence from all tobacco products for at least 12 months from quit date.    Improve shortness of breath with ADL's Yes    Intervention Provide education, individualized exercise plan and daily activity instruction to help decrease symptoms of SOB with activities of daily living.    Expected Outcomes Short Term: Improve cardiorespiratory fitness to achieve a reduction of symptoms when performing ADLs;Long Term: Be able to perform more ADLs without symptoms or delay the onset of symptoms    Increase knowledge of respiratory medications and ability to use respiratory devices properly  Yes    Intervention Provide education and demonstration as needed of appropriate use of medications, inhalers, and oxygen therapy.    Expected Outcomes Short Term: Achieves  understanding of medications use. Understands that oxygen is a medication prescribed by physician. Demonstrates appropriate use of inhaler and oxygen therapy.;Long Term: Maintain appropriate use of medications, inhalers, and oxygen therapy.    Diabetes Yes    Intervention Provide education about signs/symptoms and action to take for hypo/hyperglycemia.;Provide education about proper nutrition, including hydration, and aerobic/resistive exercise prescription along with prescribed medications to achieve blood glucose in normal ranges: Fasting glucose 65-99 mg/dL    Expected Outcomes Short Term: Participant verbalizes understanding of the signs/symptoms and immediate care of hyper/hypoglycemia, proper foot care and importance of medication, aerobic/resistive exercise and nutrition plan for blood glucose control.;Long Term: Attainment of HbA1C < 7%.    Hypertension Yes    Intervention Provide education on lifestyle modifcations including regular physical activity/exercise, weight management, moderate sodium restriction and increased consumption of fresh fruit, vegetables, and low fat dairy, alcohol moderation, and smoking cessation.;Monitor prescription use compliance.    Expected Outcomes Short Term: Continued assessment and intervention until BP is < 140/70mm HG in hypertensive participants. < 130/19mm HG in hypertensive participants with diabetes, heart failure or chronic kidney disease.;Long Term: Maintenance of blood pressure at goal levels.    Lipids Yes    Intervention Provide education and support for participant on nutrition & aerobic/resistive exercise along with prescribed medications to achieve LDL 70mg , HDL >40mg .    Expected Outcomes Short Term: Participant states understanding of desired cholesterol values and is compliant with medications prescribed. Participant is following exercise prescription and nutrition guidelines.;Long Term: Cholesterol controlled with medications as prescribed, with  individualized exercise RX and with personalized nutrition plan. Value goals: LDL < , HDL > 40 mg.             Education:Diabetes - Individual verbal and written instruction to review signs/symptoms of diabetes, desired ranges of glucose level fasting, after meals and with exercise. Acknowledge that pre and post exercise glucose checks will be done for 3 sessions at entry of program. Flowsheet Row Pulmonary Rehab from 03/06/2021 in [facility] Cardiac and Pulmonary Rehab  Date 02/03/21  Educator [facility]  Instruction Review Code 1- Verbalizes Understanding       Know Your Numbers and Heart Failure: - Group verbal and visual instruction to discuss disease risk factors for cardiac and pulmonary disease and treatment options.  Reviews associated critical values for Overweight/Obesity, Hypertension, Cholesterol, and Diabetes.  Discusses basics of heart failure: signs/symptoms and treatments.  Introduces Heart Failure Zone chart for action plan for heart failure.  Written  material given at graduation.   Core Components/Risk Factors/Patient Goals Review:   Goals and Risk Factor Review     Row Name 02/03/21 1326 03/04/21 0729           Core Components/Risk Factors/Patient Goals Review   Personal Goals Review Tobacco Cessation Tobacco Cessation;Weight Management/Obesity;Increase knowledge of respiratory medications and ability to use respiratory devices properly.;Hypertension;Diabetes;Improve shortness of breath with ADL's      Review Robert Combs is a current tobacco user. Intervention for tobacco cessation was provided at the initial medical review. He was asked about readiness to quit and reported that he is considering it and has cut back already . Patient was advised and educated about tobacco cessation using combination therapy, tobacco cessation classes, quit line, and quit smoking apps. Patient demonstrated understanding of this material but declined hand outs at this time. Staff will continue to  provide encouragement and follow up with the patient throughout the program. Robert Combs is doing well in rehab.  He is still smoking a pack a day and not leaving the house on the weekends.  He was encouraged to call his doctor about the depression. He is not really intersted in quitting yet.  His pressures are still all over the place and he only checks them occassionally at home.  We talked about getting into habit of checking it at home to track it better.  He has not been checking his sugars as he does not feel like doing anything at home.  He is doing well with his meds. His chest has been feeling tight which does not help his breathing.      Expected Outcomes Short: Continue to reduce number of cigarettes per day Long: Set a quit date Short: Call doctor about depression and check sugars and pressures LOng: Continue to work on a routine to help with depression.               Core Components/Risk Factors/Patient Goals at Discharge (Final Review):   Goals and Risk Factor Review - 03/04/21 0729       Core Components/Risk Factors/Patient Goals Review   Personal Goals Review Tobacco Cessation;Weight Management/Obesity;Increase knowledge of respiratory medications and ability to use respiratory devices properly.;Hypertension;Diabetes;Improve shortness of breath with ADL's    Review Robert Combs is doing well in rehab.  He is still smoking a pack a day and not leaving the house on the weekends.  He was encouraged to call his doctor about the depression. He is not really intersted in quitting yet.  His pressures are still all over the place and he only checks them occassionally at home.  We talked about getting into habit of checking it at home to track it better.  He has not been checking his sugars as he does not feel like doing anything at home.  He is doing well with his meds. His chest has been feeling tight which does not help his breathing.    Expected Outcomes Short: Call doctor about depression and check  sugars and pressures LOng: Continue to work on a routine to help with depression.             ITP Comments:  ITP Comments     Row Name 01/24/21 1610 02/03/21 1152 02/04/21 0717 02/05/21 1022 02/26/21 1154   ITP Comments Virtual orientation call completed today. he has an appointment on Date: 02/03/2021  for EP eval and gym Orientation.  Documentation of diagnosis can be found in Media Tab Date: 02/03/2021 adding [facility] notees to Media  Tab . Completed and gym orientation. Initial ITP created and sent for review to Dr. Jinny Sanders, Medical Director. First full day of exercise!  Patient was oriented to gym and equipment including functions, settings, policies, and procedures.  Patient's individual exercise prescription and treatment plan were reviewed.  All starting workloads were established based on the results of the 6 minute walk test done at initial orientation visit.  The plan for exercise progression was also introduced and progression will be customized based on patient's performance and goals. Completed initial RD consultation 30 Day review completed. Medical Director ITP review done, changes made as directed, and signed approval by Medical Director.    Row Name 03/26/21 0820           ITP Comments 30 Day review completed. Medical Director ITP review done, changes made as directed, and signed approval by Medical Director.                Comments:

## 2021-03-27 ENCOUNTER — Other Ambulatory Visit: Payer: Self-pay

## 2021-03-27 DIAGNOSIS — J449 Chronic obstructive pulmonary disease, unspecified: Secondary | ICD-10-CM | POA: Diagnosis not present

## 2021-03-27 NOTE — Progress Notes (Signed)
Daily Session Note  Patient Details  Name: Robert Combs MRN: [id number] Date of Birth: [birth year] Referring Provider:   [name] Pulmonary Rehab from 02/03/2021 in [facility] Cardiac and Pulmonary Rehab  Referring Provider Danton Sewer MD  [facility]       Encounter Date: 03/27/2021  Check In:  Session Check In - 03/27/21 0715       Check-In   Supervising physician immediately available to respond to emergencies See telemetry face sheet for immediately available ER MD    Location [facility]-Cardiac & Pulmonary Rehab    Staff Present Birdie Sons, MPA, Elveria Rising, BA, ACSM CEP, Exercise Physiologist;Raegen Tarpley Amedeo Plenty, BS, ACSM CEP, Exercise Physiologist    Virtual Visit No    Medication changes reported     No    Fall or balance concerns reported    No    Tobacco Cessation No Change    Warm-up and Cool-down Performed on first and last piece of equipment    Resistance Training Performed Yes    VAD Patient? No    PAD/SET Patient? No      Pain Assessment   Currently in Pain? No/denies                Social History   Tobacco Use  Smoking Status Every Day   Packs/day: 1.00   Years: 25.00   Pack years: 25.00   Types: Cigarettes   Last attempt to quit: 07/11/2020   Years since quitting: 0.7  Smokeless Tobacco Never  Tobacco Comments   02/03/21 currently at 1 ppd, [facility] is [facility]    Goals Met:  Independence with exercise equipment Exercise tolerated well No report of cardiac concerns or symptoms Strength training completed today  Goals Unmet:  Not Applicable  Comments: Pt able to follow exercise prescription today without complaint.  Will continue to monitor for progression.    Dr. Emily Filbert is Medical Director for [place].  Dr. Ottie Glazier is Medical Director for [facility] Pulmonary Rehabilitation.

## 2021-04-01 ENCOUNTER — Other Ambulatory Visit: Payer: Self-pay

## 2021-04-01 DIAGNOSIS — J449 Chronic obstructive pulmonary disease, unspecified: Secondary | ICD-10-CM | POA: Diagnosis not present

## 2021-04-01 NOTE — Progress Notes (Signed)
Daily Session Note  Patient Details  Name: Robert Combs MRN: [id number] Date of Birth: May 26, 1946 Referring Provider:   [name] Pulmonary Rehab from 02/03/2021 in [facility] Cardiac and Pulmonary Rehab  Referring Provider Danton Sewer MD  [facility]       Encounter Date: 04/01/2021  Check In:  Session Check In - 04/01/21 0719       Check-In   Supervising physician immediately available to respond to emergencies See telemetry face sheet for immediately available ER MD    Location [facility]Cardiac & Pulmonary Re[facility]    Staff Present Birdie Sons, MPA, RN;Amanda Oletta Darter, BA, ACSM CEP, Exercise Physiologist;Melissa Caiola, RDN, LDN    Virtual Visit No    Medication changes reported     No    Fall or balance concerns reported    No    Tobacco Cessation No Change    Warm-up and Cool-down Performed on first and last piece of equipment    Resistance Training Performed Yes    VAD Patient? No    PAD/SET Patient? No      Pain Assessment   Currently in Pain? No/denies                Social History   Tobacco Use  Smoking Status Every Day   Packs/day: 1.00   Years: 25.00   Pack years: 25.00   Types: Cigarettes   Last attempt to quit: 07/11/2020   Years since quitting: 0.7  Smokeless Tobacco Never  Tobacco Comments   02/03/21 currently at 1 ppd, [facility] and he is [facility] it    Goals Met:  Independence with exercise equipment Exercise tolerated well No report of cardiac concerns or symptoms Strength training completed today  Goals Unmet:  Not Applicable  Comments: Pt able to follow exercise prescription today without complaint.  Will continue to monitor for progression.    Dr. Emily Filbert is Medical Director for [place].  Dr. Ottie Glazier is Medical Director for [facility] Pulmonary Rehabilitation.

## 2021-04-03 ENCOUNTER — Other Ambulatory Visit: Payer: Self-pay

## 2021-04-03 DIAGNOSIS — J449 Chronic obstructive pulmonary disease, unspecified: Secondary | ICD-10-CM | POA: Diagnosis not present

## 2021-04-03 NOTE — Progress Notes (Signed)
Daily Session Note  Patient Details  Name: Robert Combs MRN: [id number] Date of Birth: Oct 21, 1945 Referring Provider:   [name] Pulmonary Rehab from 02/03/2021 in [facility] Cardiac and Pulmonary Rehab  Referring Provider Danton Sewer MD  [facility]       Encounter Date: 04/03/2021  Check In:  Session Check In - 04/03/21 0712       Check-In   Supervising physician immediately available to respond to emergencies See telemetry face sheet for immediately available ER MD    Location [facility] Pul[facility] Re[facility]    Staff Present Birdie Sons, MPA, Mauricia Area, BS, ACSM CEP, Exercise Physiologist;Joseph Tessie Fass, [place]    Virtual Visit No    Medication changes reported     No    Fall or balance concerns reported    No    Tobacco Cessation No Change    Warm-up and Cool-down Performed on first and last piece of equipment    Resistance Training Performed Yes    VAD Patient? No    PAD/SET Patient? No      Pain Assessment   Currently in Pain? No/denies                Social History   Tobacco Use  Smoking Status Every Day   Packs/day: 1.00   Years: 25.00   Pack years: 25.00   Types: Cigarettes   Last attempt to quit: 07/11/2020   Years since quitting: 0.7  Smokeless Tobacco Never  Tobacco Comments   02/03/21 currently at 1 ppd, [facility] and he is considering it    Goals Met:  Independence with exercise equipment Exercise tolerated well No report of cardiac concerns or symptoms Strength training completed today  Goals Unmet:  Not Applicable  Comments: Pt able to follow exercise prescription today without complaint.  Will continue to monitor for progression.    Dr. Emily Filbert is Medical Director for [place].  Dr. Ottie Glazier is Medical Director for [facility] Pulmonary Rehabilitation.

## 2021-04-08 ENCOUNTER — Other Ambulatory Visit: Payer: Self-pay

## 2021-04-08 DIAGNOSIS — J449 Chronic obstructive pulmonary disease, unspecified: Secondary | ICD-10-CM | POA: Diagnosis not present

## 2021-04-08 NOTE — Progress Notes (Signed)
Daily Session Note  Patient Details  Name: Robert Combs MRN: [id number] Date of Birth: 21-Apr-1946 Referring Provider:   [name] Pulmonary Rehab from 02/03/2021 in [facility] Cardiac and Pulmonary Rehab  Referring Provider Danton Sewer MD  [facility]       Encounter Date: 04/08/2021  Check In:  Session Check In - 04/08/21 0710       Check-In   Supervising physician immediately available to respond to emergencies See telemetry face sheet for immediately available ER MD    Location [facility]Card[facility] & Pulmonary Re[facility]    Staff Present Birdie Sons, MPA, RN;Amanda Sommer, BA, ACSM CEP, Exercise Physiologist;Jessica Luan Pulling, MA, RCEP, CCRP, CCET    Virtual Visit No    Medication changes reported     No    Fall or balance concerns reported    No    Tobacco Cessation No Change    Warm-up and Cool-down Performed on first and last piece of equipment    Resistance Training Performed Yes    VAD Patient? No    PAD/SET Patient? No      Pain Assessment   Currently in Pain? No/denies                Social History   Tobacco Use  Smoking Status Every Day   Packs/day: 1.00   Years: 25.00   Pack years: 25.00   Types: Cigarettes   Last attempt to quit: 07/11/2020   Years since quitting: 0.7  Smokeless Tobacco Never  Tobacco Comments   02/03/21 currently at 1 ppd, [facility] and he is considering it    Goals Met:  Independence with exercise equipment Exercise tolerated well No report of cardiac concerns or symptoms Strength training completed today  Goals Unmet:  Not Applicable  Comments: Pt able to follow exercise prescription today without complaint.  Will continue to monitor for progression.    Dr. Emily Filbert is Medical Director for [place].  Dr. Ottie Glazier is Medical Director for [facility] Pulmonary Rehabilitation.

## 2021-04-10 ENCOUNTER — Other Ambulatory Visit: Payer: Self-pay

## 2021-04-10 DIAGNOSIS — J449 Chronic obstructive pulmonary disease, unspecified: Secondary | ICD-10-CM | POA: Diagnosis not present

## 2021-04-10 NOTE — Progress Notes (Signed)
Daily Session Note  Patient Details  Name: Robert Combs MRN: [id number] Date of Birth: [birth year] Referring Provider:   [name] Pulmonary Rehab from 02/03/2021 in [facility] Cardiac and Pulmonary Rehab  Referring Provider Danton Sewer MD  [facility]       Encounter Date: 04/10/2021  Check In:  Session Check In - 04/10/21 0709       Check-In   Supervising physician immediately available to respond to emergencies See telemetry face sheet for immediately available ER MD    Location [facility] & Pulmonary Rehab    Staff Present Birdie Sons, MPA, Mauricia Area, BS, ACSM CEP, Exercise Physiologist;Amanda Oletta Darter, BA, ACSM CEP, Exercise Physiologist    Virtual Visit No    Medication changes reported     No    Fall or balance concerns reported    No    Tobacco Cessation No Change    Warm-up and Cool-down Performed on first and last piece of equipment    Resistance Training Performed Yes    VAD Patient? No    PAD/SET Patient? No      Pain Assessment   Currently in Pain? No/denies                Social History   Tobacco Use  Smoking Status Every Day   Packs/day: 1.00   Years: 25.00   Pack years: 25.00   Types: Cigarettes   Last attempt to quit: 07/11/2020   Years since quitting: 0.7  Smokeless Tobacco Never  Tobacco Comments   02/03/21 currently at 1 ppd, [facility] and he is considering it    Goals Met:  Independence with exercise equipment Exercise tolerated well No report of cardiac concerns or symptoms Strength training completed today  Goals Unmet:  Not Applicable  Comments: Pt able to follow exercise prescription today without complaint.  Will continue to monitor for progression.    Dr. Emily Filbert is Medical Director for [place].  Dr. Ottie Glazier is Medical Director for [facility] Pulmonary Rehabilitation.

## 2021-04-15 ENCOUNTER — Other Ambulatory Visit: Payer: Self-pay

## 2021-04-15 DIAGNOSIS — J449 Chronic obstructive pulmonary disease, unspecified: Secondary | ICD-10-CM | POA: Diagnosis not present

## 2021-04-15 NOTE — Progress Notes (Signed)
Daily Session Note  Patient Details  Name: FESTUS PURSEL MRN: [id number] Date of Birth: January 28, 1946 Referring Provider:   [name] Pulmonary Rehab from 02/03/2021 in [facility] Cardiac and Pulmonary Rehab  Referring Provider Danton Sewer MD  [facility]       Encounter Date: 04/15/2021  Check In:  Session Check In - 04/15/21 0708       Check-In   Supervising physician immediately available to respond to emergencies See telemetry face sheet for immediately available ER MD    Location [facility]-Cardiac & Pulmonary Rehab    Staff Present Birdie Sons, MPA, RN;Jessica Antoine, MA, RCEP, CCRP, CCET;Amanda Sommer, BA, ACSM CEP, Exercise Physiologist    Virtual Visit No    Medication changes reported     No    Fall or balance concerns reported    No    Tobacco Cessation No Change    Warm-up and Cool-down Performed on first and last piece of equipment    Resistance Training Performed Yes    VAD Patient? No    PAD/SET Patient? No      Pain Assessment   Currently in Pain? No/denies                Social History   Tobacco Use  Smoking Status Every Day   Packs/day: 1.00   Years: 25.00   Pack years: 25.00   Types: Cigarettes   Last attempt to quit: 07/11/2020   Years since quitting: 0.7  Smokeless Tobacco Never  Tobacco Comments   02/03/21 currently at 1 ppd, [facility] and he is considering it    Goals Met:  Independence with exercise equipment Exercise tolerated well No report of concerns or symptoms today Strength training completed today  Goals Unmet:  Not Applicable  Comments: Pt able to follow exercise prescription today without complaint.  Will continue to monitor for progression.    Dr. Emily Filbert is Medical Director for [place].  Dr. Ottie Glazier is Medical Director for [facility] Pulmonary Rehabilitation.

## 2021-04-17 ENCOUNTER — Encounter: Payer: No Typology Code available for payment source | Attending: Pulmonary Disease

## 2021-04-17 ENCOUNTER — Other Ambulatory Visit: Payer: Self-pay

## 2021-04-17 DIAGNOSIS — J449 Chronic obstructive pulmonary disease, unspecified: Secondary | ICD-10-CM | POA: Insufficient documentation

## 2021-04-17 NOTE — Progress Notes (Signed)
Daily Session Note  Patient Details  Name: Robert Combs MRN: [id number] Date of Birth: 11/14/1945 Referring Provider:   [name] Pulmonary Rehab from 02/03/2021 in [facility] Cardiac and Pulmonary Rehab  Referring Provider Danton Sewer MD  [facility]       Encounter Date: 04/17/2021  Check In:  Session Check In - 04/17/21 0700       Check-In   Supervising physician immediately available to respond to emergencies See telemetry face sheet for immediately available ER MD    Location [facility]-Cardiac & Pulmonary Rehab    Staff Present Birdie Sons, MPA, RN;Joseph Tessie Fass, Lindell Spar, BA, ACSM CEP, Exercise Physiologist;Melissa Caiola, RDN, LDN    Virtual Visit No    Medication changes reported     No    Fall or balance concerns reported    No    Tobacco Cessation No Change    Warm-up and Cool-down Performed on first and last piece of equipment    Resistance Training Performed Yes    VAD Patient? No    PAD/SET Patient? No      Pain Assessment   Currently in Pain? No/denies                Social History   Tobacco Use  Smoking Status Every Day   Packs/day: 1.00   Years: 25.00   Pack years: 25.00   Types: Cigarettes   Last attempt to quit: 07/11/2020   Years since quitting: 0.7  Smokeless Tobacco Never  Tobacco Comments   02/03/21 currently at 1 ppd, [facility] and he is considering it    Goals Met:  Independence with exercise equipment Exercise tolerated well No report of concerns or symptoms today Strength training completed today  Goals Unmet:  Not Applicable  Comments: Pt able to follow exercise prescription today without complaint.  Will continue to monitor for progression.    Dr. Emily Filbert is Medical Director for [place].  Dr. Ottie Glazier is Medical Director for [facility] Pulmonary Rehabilitation.

## 2021-04-23 ENCOUNTER — Encounter: Payer: Self-pay | Admitting: *Deleted

## 2021-04-23 DIAGNOSIS — J449 Chronic obstructive pulmonary disease, unspecified: Secondary | ICD-10-CM

## 2021-04-23 NOTE — Progress Notes (Signed)
Pulmonary Individual Treatment Plan  Patient Details  Name: Robert Combs MRN: [id number] Date of Birth: Nov 18, 1945 Referring Provider:   Doristine Devoid Pulmonary Rehab from 02/03/2021 in [facility] Card[facility] Pulmonary Rehab  Referring Provider Marcelyn Bruins MD  [facility]       Initial Encounter Date:  Flowsheet Row Pulmonary Rehab from 02/03/2021 in [facility] Cardiac and Pulmonary Rehab  Date 02/03/21       Visit Diagnosis: Chronic obstructive pulmonary disease, unspecified COPD type (HCC)  Patient's Home Medications on Admission:  Current Outpatient Medications:    albuterol (PROVENTIL HFA;VENTOLIN HFA) 108 (90 Base) MCG/ACT inhaler, Inhale 1 puff into the lungs every 6 (six) hours as needed for wheezing or shortness of breath., Disp: , Rfl:    albuterol (VENTOLIN HFA) 108 (90 Base) MCG/ACT inhaler, Inhale into the lungs., Disp: , Rfl:    Alogliptin Benzoate 25 MG TABS, Take by mouth., Disp: , Rfl:    Ascorbic Acid (VITAMIN C) 100 MG tablet, Take 100 mg by mouth daily. (Patient not taking: Reported on 01/24/2021), Disp: , Rfl:    aspirin EC 81 MG tablet, Take 81 mg by mouth daily.  , Disp: , Rfl:    benzonatate (TESSALON PERLES) 100 MG capsule, Take 1 capsule (100 mg total) by mouth every 6 (six) hours as needed for cough. (Patient not taking: Reported on 01/24/2021), Disp: 30 capsule, Rfl: 0   budesonide-formoterol (SYMBICORT) 160-4.5 MCG/ACT inhaler, Inhale into the lungs., Disp: , Rfl:    cholecalciferol (VITAMIN D) 1000 UNITS tablet, Take 1,000 Units by mouth daily.  , Disp: , Rfl:    losartan (COZAAR) 50 MG tablet, Take 25 mg by mouth daily., Disp: , Rfl:    metFORMIN (GLUCOPHAGE) 500 MG tablet, Take 1 tablet (500 mg) PO daily x 1 week, then increase to 1 tablet PO BID thereafter, Disp: 60 tablet, Rfl: 0   mometasone (ASMANEX) 220 MCG/INH inhaler, Take by mouth., Disp: , Rfl:    omeprazole (PRILOSEC OTC) 20 MG tablet, Take 20 mg by mouth daily., Disp: , Rfl:    traMADol (ULTRAM)  50 MG tablet, Take 1 tablet (50 mg total) by mouth every 6 (six) hours as needed., Disp: 10 tablet, Rfl: 0  Past Medical History: Past Medical History:  Diagnosis Date   COPD (chronic obstructive pulmonary disease) (HCC)    Coronary artery disease    DDD (degenerative disc disease)    Hypertension     Tobacco Use: Social History   Tobacco Use  Smoking Status Every Day   Packs/day: 1.00   Years: 25.00   Pack years: 25.00   Types: Cigarettes   Last attempt to quit: 07/11/2020   Years since quitting: 0.7  Smokeless Tobacco Never  Tobacco Comments   02/03/21 currently at 1 ppd, [facility] is [facility]    Labs: Recent Review Flowsheet Data     Labs for ITP Cardiac and Pulmonary Rehab Latest Ref Rng & Units 02/03/2010 11/04/2015   HCO3 20.0 - 24.0 mEq/L - 26.3(H)   TCO2 0 - 100 mmol/L 31 28   O2SAT % - 75.0        Pulmonary Assessment Scores:  Pulmonary Assessment Scores     Row Name 02/03/21 1328         ADL UCSD   ADL Phase Entry     SOB Score total 57     Rest 3     Walk 4     Stairs 5  Bath 0     Dress 0     Shop 2           CAT Score   CAT Score 37           mMRC Score   mMRC Score 0              UCSD: Self-administered rating of dyspnea associated with activities of daily living (ADLs) 6-point scale (0 = "not at all" to 5 = "maximal or unable to do because of breathlessness")  Scoring Scores range from 0 to 120.  Minimally important difference is 5 units  CAT: CAT can identify the health impairment of COPD patients and is better correlated with disease progression.  CAT has a scoring range of zero to 40. The CAT score is classified into four groups of low (less than 10), medium (10 - 20), high (21-30) and very high (31-40) based on the impact level of disease on health status. A CAT score over 10 suggests significant symptoms.  A worsening CAT score could be explained by an exacerbation, poor medication adherence, poor  inhaler technique, or progression of COPD or comorbid conditions.  CAT MCID is 2 points  mMRC: mMRC (Modified Medical Research Council) Dyspnea Scale is used to assess the degree of baseline functional disability in patients of respiratory disease due to dyspnea. No minimal important difference is established. A decrease in score of 1 point or greater is considered a positive change.   Pulmonary Function Assessment:  Pulmonary Function Assessment - 02/03/21 1327       Breath   Shortness of Breath Yes;Fear of Shortness of Breath;Limiting activity             Exercise Target Goals: Exercise Program Goal: Individual exercise prescription set using results from initial 6 min walk test and THRR while considering  patient's activity barriers and safety.   Exercise Prescription Goal: Initial exercise prescription builds to 30-45 minutes a day of aerobic activity, 2-3 days per week.  Home exercise guidelines will be given to patient during program as part of exercise prescription that the participant will acknowledge.  Education: Aerobic Exercise: - Group verbal and visual presentation on the components of exercise prescription. Introduces F.I.T.T principle from ACSM for exercise prescriptions.  Reviews F.I.T.T. principles of aerobic exercise including progression. Written material given at graduation. Flowsheet Row Pulmonary Rehab from 04/17/2021 in [facility] Cardiac and Pulmonary Rehab  Date 02/06/21  Educator [facility]  Instruction Review Code 1- Verbalizes Understanding       Education: Resistance Exercise: - Group verbal and visual presentation on the components of exercise prescription. Introduces F.I.T.T principle from ACSM for exercise prescriptions  Reviews F.I.T.T. principles of resistance exercise including progression. Written material given at graduation. Flowsheet Row Pulmonary Rehab from 04/17/2021 in [facility] Cardiac and Pulmonary Rehab  Date 04/17/21  Educator [facility]  Instruction Review  Code 1- Verbalizes Understanding        Education: Exercise & Equipment Safety: - Individual verbal instruction and demonstration of equipment use and safety with use of the equipment. Flowsheet Row Pulmonary Rehab from 04/17/2021 in [facility] Cardiac and Pulmonary Rehab  Date 02/03/21  Educator [facility]  Instruction Review Code 1- Verbalizes Understanding       Education: Exercise Physiology & General Exercise Guidelines: - Group verbal and written instruction with models to review the exercise physiology of the cardiovascular system and associated critical values. Provides general exercise guidelines with specific guidelines to those with heart or lung disease.  Education: Flexibility, Balance, Mind/Body Relaxation: - Group verbal and visual presentation with interactive activity on the components of exercise prescription. Introduces F.I.T.T principle from ACSM for exercise prescriptions. Reviews F.I.T.T. principles of flexibility and balance exercise training including progression. Also discusses the mind body connection.  Reviews various relaxation techniques to help reduce and manage stress (i.e. Deep breathing, progressive muscle relaxation, and visualization). Balance handout provided to take home. Written material given at graduation. Flowsheet Row Pulmonary Rehab from 04/17/2021 in [facility] Card[facility] and Pulmonary Rehab  Date 02/20/21  Educator [facility]  Instruction Review Code 1- Verbalizes Understanding       Activity Barriers & Risk Stratification:  Activity Barriers & Cardiac Risk Stratification - 02/03/21 1154       Activity Barriers & Cardiac Risk Stratification   Activity Barriers Shortness of Breath;Joint Problems;Deconditioning;Muscular Weakness;Balance Concerns             6 Minute Walk:  6 Minute Walk     Row Name 02/03/21 1152         6 Minute Walk   Phase Initial     Distance 1380 feet     Walk Time 6 minutes     # of Rest Breaks 0     MPH 2.61     METS 2.72      RPE 11     Perceived Dyspnea  3     VO2 Peak 9.51     Symptoms Yes (comment)     Comments legs burning, SOB     Resting HR 60 bpm     Resting BP 122/62     Resting Oxygen Saturation  94 %     Exercise Oxygen Saturation  during 6 min walk 91 %     Max Ex. HR 83 bpm     Max Ex. BP 156/74     2 Minute Post BP 132/70           Interval HR   1 Minute HR 59     2 Minute HR 66     3 Minute HR 67     4 Minute HR 82     5 Minute HR 83     6 Minute HR 70     2 Minute Post HR 65     Interval Heart Rate? Yes           Interval Oxygen   Interval Oxygen? Yes     Baseline Oxygen Saturation % 94 %     1 Minute Oxygen Saturation % 93 %     1 Minute Liters of Oxygen 0 L  Room Air     2 Minute Oxygen Saturation % 92 %     2 Minute Liters of Oxygen 0 L     3 Minute Oxygen Saturation % 91 %     3 Minute Liters of Oxygen 0 L     4 Minute Oxygen Saturation % 92 %     4 Minute Liters of Oxygen 0 L     5 Minute Oxygen Saturation % 91 %     5 Minute Liters of Oxygen 0 L     6 Minute Oxygen Saturation % 91 %     6 Minute Liters of Oxygen 0 L     2 Minute Post Oxygen Saturation % 96 %     2 Minute Post Liters of Oxygen 0 L             Oxygen Initial Assessment:  Oxygen Initial Assessment - 02/03/21 1327       Home Oxygen   Home Oxygen Device None    Sleep Oxygen Prescription None    Home Exercise Oxygen Prescription None    Home Resting Oxygen Prescription None      Initial 6 min Walk   Oxygen Used None      Program Oxygen Prescription   Program Oxygen Prescription None      Intervention   Short Term Goals To learn and demonstrate proper pursed lip breathing techniques or other breathing techniques. ;To learn and demonstrate proper use of respiratory medications;To learn and understand importance of maintaining oxygen saturations>88%;To learn and understand importance of monitoring SPO2 with pulse oximeter and demonstrate accurate use of the pulse oximeter.    Long  Term Goals  Exhibits proper breathing techniques, such as pursed lip breathing or other method taught during program session;Compliance with respiratory medication;Maintenance of O2 saturations>88%;Verbalizes importance of monitoring SPO2 with pulse oximeter and return demonstration             Oxygen Re-Evaluation:  Oxygen Re-Evaluation     Row Name 02/04/21 0718 03/04/21 0737 04/01/21 0733         Program Oxygen Prescription   Program Oxygen Prescription None None None           Home Oxygen   Home Oxygen Device None None None     Sleep Oxygen Prescription None None None     Home Exercise Oxygen Prescription None None None     Home Resting Oxygen Prescription None None None     Compliance with Home Oxygen Use Yes -- Yes           Goals/Expected Outcomes   Short Term Goals To learn and demonstrate proper pursed lip breathing techniques or other breathing techniques. ;To learn and understand importance of maintaining oxygen saturations>88%;To learn and understand importance of monitoring SPO2 with pulse oximeter and demonstrate accurate use of the pulse oximeter. To learn and demonstrate proper pursed lip breathing techniques or other breathing techniques. ;To learn and understand importance of maintaining oxygen saturations>88%;To learn and understand importance of monitoring SPO2 with pulse oximeter and demonstrate accurate use of the pulse oximeter. To learn and demonstrate proper pursed lip breathing techniques or other breathing techniques. ;To learn and understand importance of maintaining oxygen saturations>88%;To learn and understand importance of monitoring SPO2 with pulse oximeter and demonstrate accurate use of the pulse oximeter.     Long  Term Goals Exhibits proper breathing techniques, such as pursed lip breathing or other method taught during program session;Compliance with respiratory medication;Maintenance of O2 saturations>88%;Verbalizes importance of monitoring SPO2 with pulse  oximeter and return demonstration Exhibits proper breathing techniques, such as pursed lip breathing or other method taught during program session;Compliance with respiratory medication;Maintenance of O2 saturations>88%;Verbalizes importance of monitoring SPO2 with pulse oximeter and return demonstration Exhibits proper breathing techniques, such as pursed lip breathing or other method taught during program session;Compliance with respiratory medication;Maintenance of O2 saturations>88%;Verbalizes importance of monitoring SPO2 with pulse oximeter and return demonstration     Comments Reviewed PLB technique with pt.  Talked about how it works and it's importance in maintaining their exercise saturations. Robert Combs is doing well in rehab. His breathing is getting better, but he is stil smoking and his chest feels tight.  We talked about quitting smoking to help. He does use his PLB with exercise. Robert Combs reports his breathing is about the time. He does not have a pulse oximeter at  home, discussed importance of checking oxygen. He continues to practice PLB and feels that it helps. He is still smoking, he is smoking the same amount, but is interested in quitting. He has quit 4-5 times in 3 years, when he feels triggered he will start again.     Goals/Expected Outcomes Short: Become more profiecient at using PLB.   Long: Become independent at using PLB. short: Continue to work on breathing Long: Use PLB more frequently short: Think about a quit date Long: Use PLB more frequently              Oxygen Discharge (Final Oxygen Re-Evaluation):  Oxygen Re-Evaluation - 04/01/21 0733       Program Oxygen Prescription   Program Oxygen Prescription None      Home Oxygen   Home Oxygen Device None    Sleep Oxygen Prescription None    Home Exercise Oxygen Prescription None    Home Resting Oxygen Prescription None    Compliance with Home Oxygen Use Yes      Goals/Expected Outcomes   Short Term Goals To learn and  demonstrate proper pursed lip breathing techniques or other breathing techniques. ;To learn and understand importance of maintaining oxygen saturations>88%;To learn and understand importance of monitoring SPO2 with pulse oximeter and demonstrate accurate use of the pulse oximeter.    Long  Term Goals Exhibits proper breathing techniques, such as pursed lip breathing or other method taught during program session;Compliance with respiratory medication;Maintenance of O2 saturations>88%;Verbalizes importance of monitoring SPO2 with pulse oximeter and return demonstration    Comments Robert Combs reports his breathing is about the time. He does not have a pulse oximeter at home, discussed importance of checking oxygen. He continues to practice PLB and feels that it helps. He is still smoking, he is smoking the same amount, but is interested in quitting. He has quit 4-5 times in 3 years, when he feels triggered he will start again.    Goals/Expected Outcomes short: Think about a quit date Long: Use PLB more frequently             Initial Exercise Prescription:  Initial Exercise Prescription - 02/03/21 1300       Date of Initial Exercise RX and Referring Provider   Date 02/03/21    Referring Provider Marcelyn Bruins MD   [facility]     Treadmill   MPH 2.2    Grade 0.5    Minutes 15    METs 2.84      Recumbant Bike   Level 2    RPM 50    Watts 22    Minutes 15    METs 2.6      Biostep-RELP   Level 2    SPM 50    Minutes 15    METs 2      Track   Laps 30    Minutes 15    METs 2.6      Prescription Details   Frequency (times per week) 2    Duration Progress to 30 minutes of continuous aerobic without signs/symptoms of physical distress      Intensity   THRR 40-80% of Max Heartrate 95-130    Ratings of Perceived Exertion 11-13    Perceived Dyspnea 0-4      Progression   Progression Continue to progress workloads to maintain intensity without signs/symptoms of physical distress.       Resistance Training   Training Prescription Yes    Weight 4 lb  Reps 10-15             Perform Capillary Blood Glucose checks as needed.  Exercise Prescription Changes:   Exercise Prescription Changes     Row Name 02/03/21 1300 02/18/21 1500 03/05/21 1700 03/18/21 0700 04/01/21 1400     Response to Exercise   Blood Pressure (Admit) 122/62 98/54 110/64 128/64 122/62   Blood Pressure (Exercise) 156/74 120/60 138/66 132/62 --   Blood Pressure (Exit) 110/66 112/60 124/64 112/66 130/82   Heart Rate (Admit) 60 bpm 65 bpm 58 bpm 88 bpm 52 bpm   Heart Rate (Exercise) 83 bpm 94 bpm 96 bpm 93 bpm 88 bpm   Heart Rate (Exit) 62 bpm 61 bpm 66 bpm 80 bpm 73 bpm   Oxygen Saturation (Admit) 94 % 89 % 94 % 94 % 94 %   Oxygen Saturation (Exercise) 91 % 90 % 93 % 91 % 92 %   Oxygen Saturation (Exit) 94 % 95 % 95 % 94 % 93 %   Rating of Perceived Exertion (Exercise) 11 13 13 13 13    Perceived Dyspnea (Exercise) 3 0 2 0 1   Symptoms SOB, legs burning none -- none none   Comments walk test results -- -- -- --   Duration -- Progress to 30 minutes of  aerobic without signs/symptoms of physical distress Continue with 30 min of aerobic exercise without signs/symptoms of physical distress. Continue with 30 min of aerobic exercise without signs/symptoms of physical distress. Continue with 30 min of aerobic exercise without signs/symptoms of physical distress.   Intensity -- THRR unchanged THRR unchanged THRR unchanged THRR unchanged     Progression   Progression -- Continue to progress workloads to maintain intensity without signs/symptoms of physical distress. Continue to progress workloads to maintain intensity without signs/symptoms of physical distress. Continue to progress workloads to maintain intensity without signs/symptoms of physical distress. Continue to progress workloads to maintain intensity without signs/symptoms of physical distress.   Average METs -- 2.4 2.2 3.02 3     Resistance  Training   Training Prescription -- Yes Yes Yes Yes   Weight -- 4 lb 5 lb 5 lb 6 lb   Reps -- 10-15 10-15 10-15 10-15     Interval Training   Interval Training -- No No No No     Treadmill   MPH -- 2 -- 2.5 --   Grade -- 0 -- 0.5 --   Minutes -- 15 -- 15 --   METs -- 2.53 -- 3.09 --     Recumbant Bike   Level -- 2 -- 5 --   Watts -- 19 -- 33 --   Minutes -- 15 -- 15 --   METs -- 2.66 -- 3.1 --     Biostep-RELP   Level -- 2 2 -- 5   Minutes -- 15 15 -- 15   METs -- 2 2 -- 3     Track   Laps -- 27 27 -- 39   Minutes -- 15 15 -- 15   METs -- 2.47 2.47 -- 3.12    Row Name 04/14/21 1500             Response to Exercise   Blood Pressure (Admit) 126/64       Blood Pressure (Exit) 132/82       Heart Rate (Admit) 87 bpm       Heart Rate (Exercise) 86 bpm       Heart Rate (Exit) 72 bpm  Oxygen Saturation (Admit) 93 %       Oxygen Saturation (Exercise) 89 %       Oxygen Saturation (Exit) 95 %       Rating of Perceived Exertion (Exercise) 13       Perceived Dyspnea (Exercise) 1       Symptoms none       Duration Continue with 30 min of aerobic exercise without signs/symptoms of physical distress.       Intensity THRR unchanged               Progression   Progression Continue to progress workloads to maintain intensity without signs/symptoms of physical distress.       Average METs 2.91               Resistance Training   Training Prescription Yes       Weight 6 lb       Reps 10-15               Interval Training   Interval Training No               Treadmill   MPH 2.7       Grade 1       Minutes 15       METs 3.44               Recumbant Bike   Level 3       Watts 25       Minutes 15       METs 3.79               Biostep-RELP   Level 5       Minutes 15       METs 2               Track   Laps 44       Minutes 15       METs 3.39               Home Exercise Plan   Plans to continue exercise at Home (comment)  walking       Frequency  Add 2 additional days to program exercise sessions.       Initial Home Exercises Provided 03/04/21                Exercise Comments:   Exercise Comments     Row Name 02/04/21 [id number]           Exercise Comments First full day of exercise!  Patient was oriented to gym and equipment including functions, settings, policies, and procedures.  Patient's individual exercise prescription and treatment plan were reviewed.  All starting workloads were established based on the results of the 6 minute walk test done at initial orientation visit.  The plan for exercise progression was also introduced and progression will be customized based on patient's performance and goals.                Exercise Goals and Review:   Exercise Goals     Row Name 02/03/21 1319             Exercise Goals   Increase Physical Activity Yes       Intervention Provide advice, education, support and counseling about physical activity/exercise needs.;Develop an individualized exercise prescription for aerobic and resistive training based on initial evaluation findings, risk stratification, comorbidities and participant's personal goals.  Expected Outcomes Short Term: Attend rehab on a regular basis to increase amount of physical activity.;Long Term: Add in home exercise to make exercise part of routine and to increase amount of physical activity.;Long Term: Exercising regularly at least 3-5 days a week.       Increase Strength and Stamina Yes       Intervention Provide advice, education, support and counseling about physical activity/exercise needs.;Develop an individualized exercise prescription for aerobic and resistive training based on initial evaluation findings, risk stratification, comorbidities and participant's personal goals.       Expected Outcomes Short Term: Increase workloads from initial exercise prescription for resistance, speed, and METs.;Short Term: Perform resistance training exercises  routinely during rehab and add in resistance training at home;Long Term: Improve cardiorespiratory fitness, muscular endurance and strength as measured by increased METs and functional capacity ( )       Able to understand and use rate of perceived exertion (RPE) scale Yes       Intervention Provide education and explanation on how to use RPE scale       Expected Outcomes Short Term: Able to use RPE daily in rehab to express subjective intensity level;Long Term:  Able to use RPE to guide intensity level when exercising independently       Able to understand and use Dyspnea scale Yes       Intervention Provide education and explanation on how to use Dyspnea scale       Expected Outcomes Short Term: Able to use Dyspnea scale daily in rehab to express subjective sense of shortness of breath during exertion;Long Term: Able to use Dyspnea scale to guide intensity level when exercising independently       Knowledge and understanding of Target Heart Rate Range (THRR) Yes       Intervention Provide education and explanation of THRR including how the numbers were predicted and where they are located for reference       Expected Outcomes Short Term: Able to use daily as guideline for intensity in rehab;Short Term: Able to state/look up THRR;Long Term: Able to use THRR to govern intensity when exercising independently       Able to check pulse independently Yes       Intervention Provide education and demonstration on how to check pulse in carotid and radial arteries.;Review the importance of being able to check your own pulse for safety during independent exercise       Expected Outcomes Short Term: Able to explain why pulse checking is important during independent exercise;Long Term: Able to check pulse independently and accurately       Understanding of Exercise Prescription Yes       Intervention Provide education, explanation, and written materials on patient's individual exercise prescription        Expected Outcomes Short Term: Able to explain program exercise prescription;Long Term: Able to explain home exercise prescription to exercise independently                Exercise Goals Re-Evaluation :  Exercise Goals Re-Evaluation     Row Name 02/04/21 0717 02/18/21 1509 03/04/21 0746 03/18/21 0749 04/01/21 0732     Exercise Goal Re-Evaluation   Exercise Goals Review Knowledge and understanding of Target Heart Rate Range (THRR);Increase Physical Activity;Able to understand and use rate of perceived exertion (RPE) scale;Understanding of Exercise Prescription;Able to understand and use Dyspnea scale;Able to check pulse independently;Increase Strength and Stamina Increase Physical Activity;Increase Strength and Stamina;Understanding of Exercise Prescription Increase Physical Activity;Increase  Strength and Stamina;Understanding of Exercise Prescription;Able to understand and use rate of perceived exertion (RPE) scale;Able to understand and use Dyspnea scale;Knowledge and understanding of Target Heart Rate Range (THRR);Able to check pulse independently Increase Physical Activity;Increase Strength and Stamina;Understanding of Exercise Prescription Increase Physical Activity;Increase Strength and Stamina;Understanding of Exercise Prescription   Comments Reviewed RPE and dyspnea scales, THR and program prescription with pt today.  Pt voiced understanding and was given a copy of goals to take home. Robert Combs is off to a good start in rehab.  He has only completed his first 4 full days of exercise thus far.  He is at 2.0 mph on the treadmill.  We will continue to monitor his progress and encourage good attendance. Robert Combs is doing well in rehab.  He is have some symptoms of depression and we talked about exercising can be helpful.  Reviewed home exercise with pt today.  Pt plans to walk at home and use staff videos for exercise.  Reviewed THR, pulse, RPE, sign and symptoms, pulse oximetery and when to call 911 or  MD.  Also discussed weather considerations and indoor options.  Pt voiced understanding. Robert Combs does not have the most consistent attendance due to his work that takes him out of town frequently.  He does do well in rehab when present.  He is up to level 5 on the recumbent bike.  We continue to monitor his progress. Robert Combs reports walking a thome 30 minutes on the days he does not come here (RPE 12). He reports he is not as consistent as he would like to be. Discussed adding warm up and cool down and monitoring O2.   Expected Outcomes Short: Use RPE daily to regulate intensity. Long: Follow program prescription in THR. Short: Attend rehab regularly Long: Continue to improve stamina. Short: get into routine with exercise Long: Continue to improve stamina Short: walk when out of town to maintain Long: Conitnue to improve stamina. Short: walk when not at rehab consistently Long: Conitnue to improve stamina.    Row Name 04/14/21 1456             Exercise Goal Re-Evaluation   Exercise Goals Review Increase Physical Activity;Increase Strength and Stamina;Understanding of Exercise Prescription       Comments Robert Combs is doing well in rehab. He is up to 44 laps on the track which is over a mile!  We will continue to monitor his progress.       Expected Outcomes Short: Continue to walk more Long: Continue to improve stamina                Discharge Exercise Prescription (Final Exercise Prescription Changes):  Exercise Prescription Changes - 04/14/21 1500       Response to Exercise   Blood Pressure (Admit) 126/64    Blood Pressure (Exit) 132/82    Heart Rate (Admit) 87 bpm    Heart Rate (Exercise) 86 bpm    Heart Rate (Exit) 72 bpm    Oxygen Saturation (Admit) 93 %    Oxygen Saturation (Exercise) 89 %    Oxygen Saturation (Exit) 95 %    Rating of Perceived Exertion (Exercise) 13    Perceived Dyspnea (Exercise) 1    Symptoms none    Duration Continue with 30 min of aerobic exercise without  signs/symptoms of physical distress.    Intensity THRR unchanged      Progression   Progression Continue to progress workloads to maintain intensity without signs/symptoms of physical distress.  Average METs 2.91      Resistance Training   Training Prescription Yes    Weight 6 lb    Reps 10-15      Interval Training   Interval Training No      Treadmill   MPH 2.7    Grade 1    Minutes 15    METs 3.44      Recumbant Bike   Level 3    Watts 25    Minutes 15    METs 3.79      Biostep-RELP   Level 5    Minutes 15    METs 2      Track   Laps 44    Minutes 15    METs 3.39      Home Exercise Plan   Plans to continue exercise at Home (comment)   walking   Frequency Add 2 additional days to program exercise sessions.    Initial Home Exercises Provided 03/04/21             Nutrition:  Target Goals: Understanding of nutrition guidelines, daily intake of sodium 1500mg , cholesterol 200mg , calories 30% from fat and 7% or less from saturated fats, daily to have 5 or more servings of fruits and vegetables.  Education: All About Nutrition: -Group instruction provided by verbal, written material, interactive activities, discussions, models, and posters to present general guidelines for heart healthy nutrition including fat, fiber, MyPlate, the role of sodium in heart healthy nutrition, utilization of the nutrition label, and utilization of this knowledge for meal planning. Follow up email sent as well. Written material given at graduation.   Biometrics:  Pre Biometrics - 02/03/21 1320       Pre Biometrics   Height 5' 10.1" (1.781 m)    Weight 213 lb 9.6 oz (96.9 kg)    BMI (Calculated) 30.55    Single Leg Stand 6.5 seconds              Nutrition Therapy Plan and Nutrition Goals:  Nutrition Therapy & Goals - 02/05/21 0948       Nutrition Therapy   Diet Heart healthy, low Na, diabetes friendly    Protein (specify units) 110-115g    Fiber 30 grams     Whole Grain Foods 3 servings    Saturated Fats 12 max. grams    Fruits and Vegetables 8 servings/day    Sodium 1.5 grams      Personal Nutrition Goals   Nutrition Goal ST: add mid morning snakc to avoid probable low BG and intense hunger, check BG at least 1x/day. LT: maintain BG during the day, choose healthier options that follow MyPlate stucture when eating out for lunch.    Comments He doesn't normally check his BG at home. His MD said his BG was good when he was last in per [place]. He reports no shortness of breath during or after eating. He reports that his appetite is lower than used to be. 221lb--> 214lbs in 3 weeks and feels the difference in his clothes. He feels his appetite was the same over this time. B: weekend: bacon and egg sandwich (1/2) L: go out: vegetables and meat or sandwiches (hamburger, hot dog, bolonga sandwich) D: cabbage and green beans, baked chicken, pork chop, corn, potatoes -baked, spaghetti with meat sauce and salad (not fond of pasta), he does not like rice, uses butter and olive oil, he reports not liking salt so she will use a little bit of salt - girlfriend  cooks. He reports waiting until he gets "extremely hungry" to eat. He feels hunger when his legs get weak or he has stomach pains. He feels it's hard for him to get balanced meal at work when he goes out for lunch. We will start with a mid morning snack to avoid extreme hunger and probable low BG and then move to modifying lunch. Discussed importance of checking BG - he reports having all the tools to check and what to do for low BG. Discussed heart healthy eaitng, diabetes friendly eating, and COPD friendly eating.      Intervention Plan   Intervention Prescribe, educate and counsel regarding individualized specific dietary modifications aiming towards targeted core components such as weight, hypertension, lipid management, diabetes, heart failure and other comorbidities.    Expected Outcomes Short Term Goal:  Understand basic principles of dietary content, such as calories, fat, sodium, cholesterol and nutrients.;Short Term Goal: A plan has been developed with personal nutrition goals set during dietitian appointment.;Long Term Goal: Adherence to prescribed nutrition plan.             Nutrition Assessments:  MEDIFICTS Score Key: ?70 Need to make dietary changes  40-70 Heart Healthy Diet ? 40 Therapeutic Level Cholesterol Diet  Flowsheet Row Pulmonary Rehab from 02/03/2021 in [facility] Cardiac and Pulmonary Rehab  Picture Your Plate Total Score on Admission 51      Picture Your Plate Scores: <62 Unhealthy dietary pattern with much room for improvement. 41-50 Dietary pattern unlikely to meet recommendations for good health and room for improvement. 51-60 More healthful dietary pattern, with some room for improvement.  >60 Healthy dietary pattern, although there may be some specific behaviors that could be improved.   Nutrition Goals Re-Evaluation:  Nutrition Goals Re-Evaluation     Row Name 03/04/21 0734 04/01/21 0737           Goals   Nutrition Goal ST: add mid morning snakc to avoid probable low BG and intense hunger, check BG at least 1x/day. LT: maintain BG during the day, choose healthier options that follow MyPlate stucture when eating out for lunch. ST: add protein to mid morning snack to avoid probable low BG and intense hunger, check BG at least 1x/day. LT: maintain BG during the day, choose healthier options that follow MyPlate stucture when eating out for lunch.      Comment Robert Combs has not had a lot of appetite.  He is eating once a day.  He has not been checking his sugars like he should.  We talked about adding in a protein shake to help. Continue to try to make a good options. Robert Combs has added a mid morning snack in the form of some fruit. He does not have a BG monitor at home. He will eat a peanut butter and jelly sandwich when he is hungry. Is not interested in protein shakes  as he feels they are for old people, but is willing to try protein bars or soymilk or nuts and seeds with fruit. He reports his appetite continues to stay low.      Expected Outcome Short: Try to eat more frequently and try protein shake Long: Continue to make healthy options. ST: add protein to mid morning snack to avoid probable low BG and intense hunger, check BG at least 1x/day. LT: maintain BG during the day, choose healthier options that follow MyPlate stucture when eating out for lunch.               Nutrition  Goals Discharge (Final Nutrition Goals Re-Evaluation):  Nutrition Goals Re-Evaluation - 04/01/21 0737       Goals   Nutrition Goal ST: add protein to mid morning snack to avoid probable low BG and intense hunger, check BG at least 1x/day. LT: maintain BG during the day, choose healthier options that follow MyPlate stucture when eating out for lunch.    Comment Robert Combs has added a mid morning snack in the form of some fruit. He does not have a BG monitor at home. He will eat a peanut butter and jelly sandwich when he is hungry. Is not interested in protein shakes as he feels they are for old people, but is willing to try protein bars or soymilk or nuts and seeds with fruit. He reports his appetite continues to stay low.    Expected Outcome ST: add protein to mid morning snack to avoid probable low BG and intense hunger, check BG at least 1x/day. LT: maintain BG during the day, choose healthier options that follow MyPlate stucture when eating out for lunch.             Psychosocial: Target Goals: Acknowledge presence or absence of significant depression and/or stress, maximize coping skills, provide positive support system. Participant is able to verbalize types and ability to use techniques and skills needed for reducing stress and depression.   Education: Stress, Anxiety, and Depression - Group verbal and visual presentation to define topics covered.  Reviews how body is  impacted by stress, anxiety, and depression.  Also discusses healthy ways to reduce stress and to treat/manage anxiety and depression.  Written material given at graduation.   Education: Sleep Hygiene -Provides group verbal and written instruction about how sleep can affect your health.  Define sleep hygiene, discuss sleep cycles and impact of sleep habits. Review good sleep hygiene tips.    Initial Review & Psychosocial Screening:  Initial Psych Review & Screening - 01/24/21 1541       Initial Review   Current issues with Current Stress Concerns    Source of Stress Concerns Family    Comments children stress him. he as MD psychiatrist at [facility]. 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      Family Dynamics   Good Support System? Yes   2 daughters, one son (one in [place] calls everyday other in [place]) and a sister.  .     Barriers   Psychosocial barriers to participate in program There are no identifiable barriers or psychosocial needs.;The patient should benefit from training in stress management and relaxation.      Screening Interventions   Interventions Provide feedback about the scores to participant;To provide support and resources with identified psychosocial needs;Encouraged to exercise    Expected Outcomes Short Term goal: Utilizing psychosocial counselor, staff and physician to assist with identification of specific Stressors or current issues interfering with healing process. Setting desired goal for each stressor or current issue identified.;Long Term Goal: Stressors or current issues are controlled or eliminated.;Short Term goal: Identification and review with participant of any Quality of Life or Depression concerns found by scoring the questionnaire.;Long Term goal: The participant improves quality of Life and PHQ9 Scores as seen by post scores and/or verbalization of changes             Quality of Life Scores:  Scores of 19 and below usually indicate a poorer quality of life in  these areas.  A difference of  2-3 points is a clinically meaningful difference.  A difference  of 2-3 points in the total score of the Quality of Life Index has been associated with significant improvement in overall quality of life, self-image, physical symptoms, and general health in studies assessing change in quality of life.  PHQ-9: Recent Review Flowsheet Data     Depression screen [facility] 2/9 04/01/2021 03/04/2021 02/03/2021   Decreased Interest 1 1 3    Down, Depressed, Hopeless 2 1 2    PHQ - 2 Score 3 2 5    Altered sleeping 3 1  3    Tired, decreased energy 2 1 3    Change in appetite 2 3 3    Feeling bad or failure about yourself  0 0 0   Trouble concentrating 0 0 0   Moving slowly or fidgety/restless 0 0 0   Suicidal thoughts 0 0 0   PHQ-9 Score 10 7 14    Difficult doing work/chores Not difficult at all Not difficult at all Not difficult at all      Interpretation of Total Score  Total Score Depression Severity:  1-4 = Minimal depression, 5-9 = Mild depression, 10-14 = Moderate depression, 15-19 = Moderately severe depression, 20-27 = Severe depression   Psychosocial Evaluation and Intervention:  Psychosocial Evaluation - 01/24/21 1601       Psychosocial Evaluation & Interventions   Interventions Encouraged to exercise with the program and follow exercise prescription    Comments [place] has no barriers to attending the program. He does travel for work and may need to flex his schedule. He lives alone and has children in the area and a girlfriend. He does get stressed and has a psychiatrist at the [place] that he talks to. He is a smoker and is trting to quit. HIs support poepl are his family. He wants to improve his breathing,have more energy and quit smoking.    Expected Outcomes STG Robert Combs is able to attend at least 2 days aweek depite his work schedule. He is able to Quit tobacco. LTG: Robert Combs has quit and maintains tobacco cessation, he is able to continue any progress he made during  the program.    Continue Psychosocial Services  Follow up required by staff             Psychosocial Re-Evaluation:  Psychosocial Re-Evaluation     Row Name 03/04/21 0725 04/01/21 0756           Psychosocial Re-Evaluation   Current issues with Current Depression;Current Sleep Concerns;History of Depression;Current Stress Concerns Current Depression;Current Sleep Concerns;History of Depression;Current Stress Concerns;Current Psychotropic Meds      Comments Robert Combs is doing well in rehab.  We did his PHQ test again and his scores are better, but still feeling depressed.  He is seeing the pyschiatrist at [place].  He has nights where he doesn't sleep well and that has been going on for more than 6 months but he has not talked to doctor about any of this. He was encouraged to talk with his doctor to help with sleep. His lack of sleep is making him feel tired. He reports being on medication for anxiety/depression - not sure what it is for exactly or what the medication is - he takes it daily. He sees a psychiatrist who may want to change the medication. He sees a therapist 1x/3 months - he says he cant get in any more frequently due to their schedule; discussed how it would benefit to find a practice he could see more often. His daughter is causing him stress - she always wants something  from him. He also finds work depressing. He enjoys working on his yard, but it had been too hard. He also enjoys golfing. He has a smaller appetite and has had for 6-8 months (unsure why). HE has trouble falling asleep and staying asleep - will stay asleep for 1 hour at a time. His MD is going to prescibe him sleep medication; he denies racing thoughts at night and is not sure why he can't sleep. Discussed different kinds of meditation and suggested trying some out.      Expected Outcomes SHort: Talk to doctor about sleep and depression Long: Continue to find the positive. SHort: see how sleep medication helps, see a  therapist more frequently, try meditation techniques  Long: Continue to find the positive.      Interventions Stress management education;Encouraged to attend Cardiac Rehabilitation for the exercise Stress management education;Relaxation education;Encouraged to attend Pulmonary Rehabilitation for the exercise      Continue Psychosocial Services  Follow up required by staff Follow up required by staff             Initial Review   Source of Stress Concerns -- Family;Occupation               Psychosocial Discharge (Final Psychosocial Re-Evaluation):  Psychosocial Re-Evaluation - 04/01/21 0756       Psychosocial Re-Evaluation   Current issues with Current Depression;Current Sleep Concerns;History of Depression;Current Stress Concerns;Current Psychotropic Meds    Comments He reports being on medication for anxiety/depression - not sure what it is for exactly or what the medication is - he takes it daily. He sees a psychiatrist who may want to change the medication. He sees a therapist 1x/3 months - he says he cant get in any more frequently due to their schedule; discussed how it would benefit to find a practice he could see more often. His daughter is causing him stress - she always wants something from him. He also finds work depressing. He enjoys working on his yard, but it had been too hard. He also enjoys golfing. He has a smaller appetite and has had for 6-8 months (unsure why). HE has trouble falling asleep and staying asleep - will stay asleep for 1 hour at a time. His MD is going to prescibe him sleep medication; he denies racing thoughts at night and is not sure why he can't sleep. Discussed different kinds of meditation and suggested trying some out.    Expected Outcomes SHort: see how sleep medication helps, see a therapist more frequently, try meditation techniques  Long: Continue to find the positive.    Interventions Stress management education;Relaxation education;Encouraged to  attend Pulmonary Rehabilitation for the exercise    Continue Psychosocial Services  Follow up required by staff      Initial Review   Source of Stress Concerns Family;Occupation             Education: Education Goals: Education classes will be provided on a weekly basis, covering required topics. Participant will state understanding/return demonstration of topics presented.  Learning Barriers/Preferences:  Learning Barriers/Preferences - 01/24/21 1546       Learning Barriers/Preferences   Learning Barriers None    Learning Preferences None             General Pulmonary Education Topics:  Infection Prevention: - Provides verbal and written material to individual with discussion of infection control including proper hand washing and proper equipment cleaning during exercise session. Flowsheet Row Pulmonary Rehab from 04/17/2021 in [facility]  Cardiac and Pulmonary Rehab  Date 02/03/21  Educator [facility]  Instruction Review Code 1- Verbalizes Understanding       Falls Prevention: - Provides verbal and written material to individual with discussion of falls prevention and safety. Flowsheet Row Pulmonary Rehab from 04/17/2021 in [facility] Cardiac and Pulmonary Rehab  Date 02/03/21  Educator [facility]  Instruction Review Code 1- Verbalizes Understanding       Chronic Lung Disease Review: - Group verbal instruction with posters, models, PowerPoint presentations and videos,  to review new updates, new respiratory medications, new advancements in procedures and treatments. Providing information on websites and "800" numbers for continued self-education. Includes information about supplement oxygen, available portable oxygen systems, continuous and intermittent flow rates, oxygen safety, concentrators, and Medicare reimbursement for oxygen. Explanation of Pulmonary Drugs, including class, frequency, complications, importance of spacers, rinsing mouth after steroid MDI's, and proper cleaning methods for  nebulizers. Review of basic lung anatomy and physiology related to function, structure, and complications of lung disease. Review of risk factors. Discussion about methods for diagnosing sleep apnea and types of masks and machines for OSA. Includes a review of the use of types of environmental controls: home humidity, furnaces, filters, dust mite/pet prevention, HEPA vacuums. Discussion about weather changes, air quality and the benefits of nasal washing. Instruction on Warning signs, infection symptoms, calling MD promptly, preventive modes, and value of vaccinations. Review of effective airway clearance, coughing and/or vibration techniques. Emphasizing that all should Create an Action Plan. Written material given at graduation. Flowsheet Row Pulmonary Rehab from 04/17/2021 in [facility] Cardiac and Pulmonary Rehab  Education need identified 02/03/21       AED/CPR: - Group verbal and written instruction with the use of models to demonstrate the basic use of the AED with the basic ABC's of resuscitation.    Anatomy and Cardiac Procedures: - Group verbal and visual presentation and models provide information about basic cardiac anatomy and function. Reviews the testing methods done to diagnose heart disease and the outcomes of the test results. Describes the treatment choices: Medical Management, Angioplasty, or Coronary Bypass Surgery for treating various heart conditions including Myocardial Infarction, Angina, Valve Disease, and Cardiac Arrhythmias.  Written material given at graduation. Flowsheet Row Pulmonary Rehab from 04/17/2021 in [facility] Cardiac and Pulmonary Rehab  Date 04/17/21  Educator [facility]  Instruction Review Code 1- Verbalizes Understanding       Medication Safety: - Group verbal and visual instruction to review commonly prescribed medications for heart and lung disease. Reviews the medication, class of the drug, and side effects. Includes the steps to properly store meds and maintain the  prescription regimen.  Written material given at graduation. Flowsheet Row Pulmonary Rehab from 04/17/2021 in [facility] Cardiac and Pulmonary Rehab  Date 03/06/21  Educator SB  Instruction Review Code 1- Verbalizes Understanding       Other: -Provides group and verbal instruction on various topics (see comments)   Knowledge Questionnaire Score:  Knowledge Questionnaire Score - 02/03/21 1323       Knowledge Questionnaire Score   Pre Score 13/16 Education Focus: Chonic Lung Disease, O2 safety              Core Components/Risk Factors/Patient Goals at Admission:  Personal Goals and Risk Factors at Admission - 02/03/21 1323       Core Components/Risk Factors/Patient Goals on Admission    Weight Management Yes;Weight Maintenance;Obesity    Intervention Weight Management: Provide education and appropriate resources to help participant work on and attain dietary goals.;Weight  Management: Develop a combined nutrition and exercise program designed to reach desired caloric intake, while maintaining appropriate intake of nutrient and fiber, sodium and fats, and appropriate energy expenditure required for the weight goal.;Weight Management/Obesity: Establish reasonable short term and long term weight goals.;Obesity: Provide education and appropriate resources to help participant work on and attain dietary goals.    Admit Weight 213 lb 9.6 oz (96.9 kg)    Goal Weight: Short Term 208 lb (94.3 kg)    Goal Weight: Long Term 200 lb (90.7 kg)    Expected Outcomes Short Term: Continue to assess and modify interventions until short term weight is achieved;Long Term: Adherence to nutrition and physical activity/exercise program aimed toward attainment of established weight goal;Weight Loss: Understanding of general recommendations for a balanced deficit meal plan, which promotes 1-2 lb weight loss per week and includes a negative energy balance of [id number] kcal/d;Understanding recommendations for meals to  include 15-35% energy as protein, 25-35% energy from fat, 35-60% energy from carbohydrates, less than 200mg  of dietary cholesterol, 20-35 gm of total fiber daily;Understanding of distribution of calorie intake throughout the day with the consumption of 4-5 meals/snacks    Tobacco Cessation Yes    Number of packs per day 1 per day    Intervention Assist the participant in steps to quit. Provide individualized education and counseling about committing to Tobacco Cessation, relapse prevention, and pharmacological support that can be provided by physician.;Education officer, environmental, assist with locating and accessing local/national Quit Smoking programs, and support quit date choice.    Expected Outcomes Short Term: Will demonstrate readiness to quit, by selecting a quit date.;Short Term: Will quit all tobacco product use, adhering to prevention of relapse plan.;Long Term: Complete abstinence from all tobacco products for at least 12 months from quit date.    Improve shortness of breath with ADL's Yes    Intervention Provide education, individualized exercise plan and daily activity instruction to help decrease symptoms of SOB with activities of daily living.    Expected Outcomes Short Term: Improve cardiorespiratory fitness to achieve a reduction of symptoms when performing ADLs;Long Term: Be able to perform more ADLs without symptoms or delay the onset of symptoms    Increase knowledge of respiratory medications and ability to use respiratory devices properly  Yes    Intervention Provide education and demonstration as needed of appropriate use of medications, inhalers, and oxygen therapy.    Expected Outcomes Short Term: Achieves understanding of medications use. Understands that oxygen is a medication prescribed by physician. Demonstrates appropriate use of inhaler and oxygen therapy.;Long Term: Maintain appropriate use of medications, inhalers, and oxygen therapy.    Diabetes Yes    Intervention  Provide education about signs/symptoms and action to take for hypo/hyperglycemia.;Provide education about proper nutrition, including hydration, and aerobic/resistive exercise prescription along with prescribed medications to achieve blood glucose in normal ranges: Fasting glucose 65-99 mg/dL    Expected Outcomes Short Term: Participant verbalizes understanding of the signs/symptoms and immediate care of hyper/hypoglycemia, proper foot care and importance of medication, aerobic/resistive exercise and nutrition plan for blood glucose control.;Long Term: Attainment of HbA1C < 7%.    Hypertension Yes    Intervention Provide education on lifestyle modifcations including regular physical activity/exercise, weight management, moderate sodium restriction and increased consumption of fresh fruit, vegetables, and low fat dairy, alcohol moderation, and smoking cessation.;Monitor prescription use compliance.    Expected Outcomes Short Term: Continued assessment and intervention until BP is < 140/75mm HG in hypertensive participants. < 130/50mm HG  in hypertensive participants with diabetes, heart failure or chronic kidney disease.;Long Term: Maintenance of blood pressure at goal levels.    Lipids Yes    Intervention Provide education and support for participant on nutrition & aerobic/resistive exercise along with prescribed medications to achieve LDL 70mg , HDL >40mg .    Expected Outcomes Short Term: Participant states understanding of desired cholesterol values and is compliant with medications prescribed. Participant is following exercise prescription and nutrition guidelines.;Long Term: Cholesterol controlled with medications as prescribed, with individualized exercise RX and with personalized nutrition plan. Value goals: LDL < , HDL > 40 mg.             Education:Diabetes - Individual verbal and written instruction to review signs/symptoms of diabetes, desired ranges of glucose level fasting, after meals  and with exercise. Acknowledge that pre and post exercise glucose checks will be done for 3 sessions at entry of program. Flowsheet Row Pulmonary Rehab from 04/17/2021 in [facility] Cardiac and Pulmonary Rehab  Date 02/03/21  Educator [facility]  Instruction Review Code 1- Verbalizes Understanding       Know Your Numbers and Heart Failure: - Group verbal and visual instruction to discuss disease risk factors for cardiac and pulmonary disease and treatment options.  Reviews associated critical values for Overweight/Obesity, Hypertension, Cholesterol, and Diabetes.  Discusses basics of heart failure: signs/symptoms and treatments.  Introduces Heart Failure Zone chart for action plan for heart failure.  Written material given at graduation.   Core Components/Risk Factors/Patient Goals Review:   Goals and Risk Factor Review     Row Name 02/03/21 1326 03/04/21 0729 04/01/21 0740         Core Components/Risk Factors/Patient Goals Review   Personal Goals Review Tobacco Cessation Tobacco Cessation;Weight Management/Obesity;Increase knowledge of respiratory medications and ability to use respiratory devices properly.;Hypertension;Diabetes;Improve shortness of breath with ADL's Tobacco Cessation;Weight Management/Obesity;Hypertension;Diabetes;Improve shortness of breath with ADL's     Review Robert Combs is a current tobacco user. Intervention for tobacco cessation was provided at the initial medical review. He was asked about readiness to quit and reported that he is considering it and has cut back already . Patient was advised and educated about tobacco cessation using combination therapy, tobacco cessation classes, quit line, and quit smoking apps. Patient demonstrated understanding of this material but declined hand outs at this time. Staff will continue to provide encouragement and follow up with the patient throughout the program. Robert Combs is doing well in rehab.  He is still smoking a pack a day and not leaving the house  on the weekends.  He was encouraged to call his doctor about the depression. He is not really intersted in quitting yet.  His pressures are still all over the place and he only checks them occassionally at home.  We talked about getting into habit of checking it at home to track it better.  He has not been checking his sugars as he does not feel like doing anything at home.  He is doing well with his meds. His chest has been feeling tight which does not help his breathing. BP 122/60 today - he checks his bp at home - he does not recall how it usually runs, but last night bp 156/70. He is taking his medications as prescribed and he is having no issues. He does not check his BG at home and is not interested in doing so. He practices PLB and feels that it helps, but his overall breathing has stayed the same. He is interested in quitting  smoking, but does not have a quit date and would like to think about one. He reports he has lost weight since starting.     Expected Outcomes Short: Continue to reduce number of cigarettes per day Long: Set a quit date Short: Call doctor about depression and check sugars and pressures LOng: Continue to work on a routine to help with depression. Short: Continue to check BP at home, find his BG monitor, decide on a smoking quit date Long: quit smoking              Core Components/Risk Factors/Patient Goals at Discharge (Final Review):   Goals and Risk Factor Review - 04/01/21 0740       Core Components/Risk Factors/Patient Goals Review   Personal Goals Review Tobacco Cessation;Weight Management/Obesity;Hypertension;Diabetes;Improve shortness of breath with ADL's    Review BP 122/60 today - he checks his bp at home - he does not recall how it usually runs, but last night bp 156/70. He is taking his medications as prescribed and he is having no issues. He does not check his BG at home and is not interested in doing so. He practices PLB and feels that it helps, but his overall  breathing has stayed the same. He is interested in quitting smoking, but does not have a quit date and would like to think about one. He reports he has lost weight since starting.    Expected Outcomes Short: Continue to check BP at home, find his BG monitor, decide on a smoking quit date Long: quit smoking             ITP Comments:  ITP Comments     Row Name 01/24/21 1610 02/03/21 1152 02/04/21 0717 02/05/21 1022 02/26/21 1154   ITP Comments Virtual orientation call completed today. he has an appointment on Date: 02/03/2021  for EP eval and gym Orientation.  Documentation of diagnosis can be found in Media Tab Date: 02/03/2021 adding [facility] notees to Media Tab . Completed and gym orientation. Initial ITP created and sent for review to Dr. Jinny Sanders, Medical Director. First full day of exercise!  Patient was oriented to gym and equipment including functions, settings, policies, and procedures.  Patient's individual exercise prescription and treatment plan were reviewed.  All starting workloads were established based on the results of the 6 minute walk test done at initial orientation visit.  The plan for exercise progression was also introduced and progression will be customized based on patient's performance and goals. Completed initial RD consultation 30 Day review completed. Medical Director ITP review done, changes made as directed, and signed approval by Medical Director.    Row Name 03/26/21 0820 04/23/21 0753         ITP Comments 30 Day review completed. Medical Director ITP review done, changes made as directed, and signed approval by Medical Director. 30 Day review completed. Medical Director ITP review done, changes made as directed, and signed approval by Medical Director.               Comments:

## 2021-04-24 ENCOUNTER — Other Ambulatory Visit: Payer: Self-pay

## 2021-04-24 ENCOUNTER — Encounter: Payer: No Typology Code available for payment source | Admitting: *Deleted

## 2021-04-24 DIAGNOSIS — J449 Chronic obstructive pulmonary disease, unspecified: Secondary | ICD-10-CM

## 2021-04-24 NOTE — Progress Notes (Signed)
Daily Session Note  Patient Details  Name: Robert Combs MRN: [id number] Date of Birth: Jun 26, 1946 Referring Provider:   [name] Pulmonary Rehab from 02/03/2021 in [facility] Cardiac and Pulmonary Rehab  Referring Provider Danton Sewer MD  [facility]       Encounter Date: 04/24/2021  Check In:  Session Check In - 04/24/21 0732       Check-In   Supervising physician immediately available to respond to emergencies See telemetry face sheet for immediately available ER MD    Location [facility]-Cardiac & Pulmonary Rehab    Staff Present Heath Lark, RN, BSN, CCRP;Laureen Owens Shark, BS, RRT, CPFT;Kelly Amedeo Plenty, BS, ACSM CEP, Exercise Physiologist    Virtual Visit No    Medication changes reported     No    Fall or balance concerns reported    No    Current number of cigarettes/nicotine per day     4    Warm-up and Cool-down Performed on first and last piece of equipment    Resistance Training Performed Yes    VAD Patient? No    PAD/SET Patient? No      Pain Assessment   Currently in Pain? No/denies                Social History   Tobacco Use  Smoking Status Every Day   Packs/day: 1.00   Years: 25.00   Pack years: 25.00   Types: Cigarettes   Last attempt to quit: 07/11/2020   Years since quitting: 0.7  Smokeless Tobacco Never  Tobacco Comments   02/03/21 currently at 1 ppd, [facility] is [facility]    Goals Met:  Proper associated with RPD/PD & O2 Sat Independence with exercise equipment Exercise tolerated well No report of concerns or symptoms today  Goals Unmet:  Not Applicable  Comments: Pt able to follow exercise prescription today without complaint.  Will continue to monitor for progression.    Dr. Emily Filbert is Medical Director for [place].  Dr. Ottie Glazier is Medical Director for [facility] Pulmonary Rehabilitation.

## 2021-04-29 ENCOUNTER — Other Ambulatory Visit: Payer: Self-pay

## 2021-04-29 DIAGNOSIS — J449 Chronic obstructive pulmonary disease, unspecified: Secondary | ICD-10-CM | POA: Diagnosis not present

## 2021-04-29 NOTE — Progress Notes (Signed)
Daily Session Note  Patient Details  Name: Robert Combs MRN: [id number] Date of Birth: [birth year] Referring Provider:   [name] Pulmonary Rehab from 02/03/2021 in [facility] Cardiac and Pulmonary Rehab  Referring Provider Danton Sewer MD  [facility]       Encounter Date: 04/29/2021  Check In:  Session Check In - 04/29/21 0709       Check-In   Supervising physician immediately available to respond to emergencies See telemetry face sheet for immediately available ER MD    Location [facility]-Cardiac & Pulmonary Rehab    Staff Present Birdie Sons, MPA, RN;Amanda Oletta Darter, BA, ACSM CEP, Exercise Physiologist;Jessica Tifton, MA, RCEP, CCRP, CCET    Virtual Visit No    Medication changes reported     No    Fall or balance concerns reported    No    Tobacco Cessation No Change    Warm-up and Cool-down Performed on first and last piece of equipment    Resistance Training Performed Yes    VAD Patient? No    PAD/SET Patient? No      Pain Assessment   Currently in Pain? No/denies                Social History   Tobacco Use  Smoking Status Every Day   Packs/day: 1.00   Years: 25.00   Pack years: 25.00   Types: Cigarettes   Last attempt to quit: 07/11/2020   Years since quitting: 0.8  Smokeless Tobacco Never  Tobacco Comments   02/03/21 currently at 1 ppd, [facility] and [facility] is considering it    Goals Met:  Independence with exercise equipment Exercise tolerated well No report of concerns or symptoms today Strength training completed today  Goals Unmet:  Not Applicable  Comments: Pt able to follow exercise prescription today without complaint.  Will continue to monitor for progression.    Dr. Emily Filbert is Medical Director for [place].  Dr. Ottie Glazier is Medical Director for [facility] Pulmonary Rehabilitation.

## 2021-05-01 ENCOUNTER — Other Ambulatory Visit: Payer: Self-pay

## 2021-05-01 DIAGNOSIS — J449 Chronic obstructive pulmonary disease, unspecified: Secondary | ICD-10-CM

## 2021-05-01 NOTE — Progress Notes (Signed)
Daily Session Note  Patient Details  Name: Robert Combs MRN: [id number] Date of Birth: 1946/02/15 Referring Provider:   [name] Pulmonary Rehab from 02/03/2021 in [facility] Cardiac and Pulmonary Rehab  Referring Provider Danton Sewer MD  [facility]       Encounter Date: 05/01/2021  Check In:  Session Check In - 05/01/21 0657       Check-In   Supervising physician immediately available to respond to emergencies See telemetry face sheet for immediately available ER MD    Location [facility]-Cardiac & Pulmonary Rehab    Staff Present Birdie Sons, MPA, Mauricia Area, BS, ACSM CEP, Exercise Physiologist;Amanda Oletta Darter, BA, ACSM CEP, Exercise Physiologist    Virtual Visit No    Medication changes reported     No    Fall or balance concerns reported    No    Tobacco Cessation No Change    Warm-up and Cool-down Performed on first and last piece of equipment    Resistance Training Performed Yes    VAD Patient? No    PAD/SET Patient? No      Pain Assessment   Currently in Pain? No/denies                Social History   Tobacco Use  Smoking Status Every Day   Packs/day: 1.00   Years: 25.00   Pack years: 25.00   Types: Cigarettes   Last attempt to quit: 07/11/2020   Years since quitting: 0.8  Smokeless Tobacco Never  Tobacco Comments   02/03/21 currently at 1 ppd, [facility] and he is considering it    Goals Met:  Independence with exercise equipment Exercise tolerated well No report of concerns or symptoms today Strength training completed today  Goals Unmet:  Not Applicable  Comments: Pt able to follow exercise prescription today without complaint.  Will continue to monitor for progression.    Dr. Emily Filbert is Medical Director for [place].  Dr. Ottie Glazier is Medical Director for [facility] Pulmonary Rehabilitation.

## 2021-05-06 ENCOUNTER — Other Ambulatory Visit: Payer: Self-pay

## 2021-05-06 DIAGNOSIS — J449 Chronic obstructive pulmonary disease, unspecified: Secondary | ICD-10-CM | POA: Diagnosis not present

## 2021-05-06 NOTE — Progress Notes (Signed)
Daily Session Note  Patient Details  Name: Robert Combs MRN: [id number] Date of Birth: [birth year] Referring Provider:   [name] Pulmonary Rehab from 02/03/2021 in [facility] Cardiac and Pulmonary Rehab  Referring Provider Danton Sewer MD  [facility]       Encounter Date: 05/06/2021  Check In:  Session Check In - 05/06/21 0710       Check-In   Supervising physician immediately available to respond to emergencies See telemetry face sheet for immediately available ER MD    Location [facility]-Cardiac & Pulmonary Rehab    Staff Present Birdie Sons, MPA, RN;Amanda Sommer, BA, ACSM CEP, Exercise Physiologist;Jessica Luan Pulling, MA, RCEP, CCRP, CCET    Virtual Visit No    Medication changes reported     No    Fall or balance concerns reported    No    Tobacco Cessation No Change    Warm-up and Cool-down Performed on first and last piece of equipment    Resistance Training Performed Yes    VAD Patient? No    PAD/SET Patient? No      Pain Assessment   Currently in Pain? No/denies                Social History   Tobacco Use  Smoking Status Every Day   Packs/day: 1.00   Years: 25.00   Pack years: 25.00   Types: Cigarettes   Last attempt to quit: 07/11/2020   Years since quitting: 0.8  Smokeless Tobacco Never  Tobacco Comments   02/03/21 currently at 1 ppd, [facility] and he is considering it    Goals Met:  Independence with exercise equipment Exercise tolerated well No report of concerns or symptoms today Strength training completed today  Goals Unmet:  Not Applicable  Comments: Pt able to follow exercise prescription today without complaint.  Will continue to monitor for progression.    Dr. Emily Filbert is Medical Director for [place].  Dr. Ottie Glazier is Medical Director for [facility] Pulmonary Rehabilitation.

## 2021-05-08 ENCOUNTER — Other Ambulatory Visit: Payer: Self-pay

## 2021-05-08 DIAGNOSIS — J449 Chronic obstructive pulmonary disease, unspecified: Secondary | ICD-10-CM

## 2021-05-08 NOTE — Progress Notes (Signed)
Daily Session Note  Patient Details  Name: DIANA ARMIJO MRN: [id number] Date of Birth: 11-Nov-1945 Referring Provider:   [name] Pulmonary Rehab from 02/03/2021 in [facility] Cardiac and Pulmonary Rehab  Referring Provider Danton Sewer MD  [facility]       Encounter Date: 05/08/2021  Check In:  Session Check In - 05/08/21 0706       Check-In   Supervising physician immediately available to respond to emergencies See telemetry face sheet for immediately available ER MD    Location [facility]-Cardiac & Pulmonary Rehab    Staff Present Birdie Sons, MPA, Elveria Rising, BA, ACSM CEP, Exercise Physiologist;Santana Edell Amedeo Plenty, BS, ACSM CEP, Exercise Physiologist    Virtual Visit No    Medication changes reported     No    Fall or balance concerns reported    No    Tobacco Cessation No Change    Warm-up and Cool-down Performed on first and last piece of equipment    Resistance Training Performed Yes    VAD Patient? No    PAD/SET Patient? No      Pain Assessment   Currently in Pain? No/denies                Social History   Tobacco Use  Smoking Status Every Day   Packs/day: 1.00   Years: 25.00   Pack years: 25.00   Types: Cigarettes   Last attempt to quit: 07/11/2020   Years since quitting: 0.8  Smokeless Tobacco Never  Tobacco Comments   02/03/21 currently at 1 ppd, [facility] and he is considering it    Goals Met:  Independence with exercise equipment Exercise tolerated well No report of concerns or symptoms today Strength training completed today  Goals Unmet:  Not Applicable  Comments: Pt able to follow exercise prescription today without complaint.  Will continue to monitor for progression.    Dr. Emily Filbert is Medical Director for [place].  Dr. Ottie Glazier is Medical Director for [facility] Pulmonary Rehabilitation.

## 2021-05-13 ENCOUNTER — Other Ambulatory Visit: Payer: Self-pay

## 2021-05-13 DIAGNOSIS — J449 Chronic obstructive pulmonary disease, unspecified: Secondary | ICD-10-CM | POA: Diagnosis not present

## 2021-05-13 NOTE — Progress Notes (Signed)
Daily Session Note  Patient Details  Name: Robert Combs MRN: [id number] Date of Birth: 01-Aug-1946 Referring Provider:   [name] Pulmonary Rehab from 02/03/2021 in [facility] Cardiac and Pulmonary Rehab  Referring Provider Danton Sewer MD  [facility]       Encounter Date: 05/13/2021  Check In:  Session Check In - 05/13/21 0703       Check-In   Supervising physician immediately available to respond to emergencies See telemetry face sheet for immediately available ER MD    Location [facility]-Cardiac & Pulmonary Rehab    Staff Present Birdie Sons, MPA, RN;Amanda Oletta Darter, BA, ACSM CEP, Exercise Physiologist;Jessica Luan Pulling, MA, RCEP, CCRP, CCET    Virtual Visit No    Medication changes reported     No    Fall or balance concerns reported    No    Tobacco Cessation No Change    Warm-up and Cool-down Performed on first and last piece of equipment    Resistance Training Performed Yes    VAD Patient? No    PAD/SET Patient? No      Pain Assessment   Currently in Pain? No/denies                Social History   Tobacco Use  Smoking Status Every Day   Packs/day: 1.00   Years: 25.00   Pack years: 25.00   Types: Cigarettes   Last attempt to quit: 07/11/2020   Years since quitting: 0.8  Smokeless Tobacco Never  Tobacco Comments   02/03/21 currently at 1 ppd, [facility] wants [facility] to [facility] and he is considering it    Goals Met:  Independence with exercise equipment Exercise tolerated well No report of concerns or symptoms today Strength training completed today  Goals Unmet:  Not Applicable  Comments: Pt able to follow exercise prescription today without complaint.  Will continue to monitor for progression.    Dr. Emily Filbert is Medical Director for [place].  Dr. Ottie Glazier is Medical Director for [facility] Pulmonary Rehabilitation.

## 2021-05-15 ENCOUNTER — Encounter: Payer: No Typology Code available for payment source | Admitting: *Deleted

## 2021-05-15 ENCOUNTER — Other Ambulatory Visit: Payer: Self-pay

## 2021-05-15 DIAGNOSIS — J449 Chronic obstructive pulmonary disease, unspecified: Secondary | ICD-10-CM | POA: Diagnosis not present

## 2021-05-15 NOTE — Progress Notes (Signed)
Daily Session Note  Patient Details  Name: Robert Combs MRN: [id number] Date of Birth: 04/25/1946 Referring Provider:   Flowsheet Row Pulmonary Rehab from 02/03/2021 in [facility] Cardiac and Pulmonary Rehab  Referring Provider Clance, Keith MD  [[place]       Encounter Date: 05/15/2021  Check In:  Session Check In - 05/15/21 0714       Check-In   Supervising physician immediately available to respond to emergencies See telemetry face sheet for immediately available ER MD    Location [facility]-Cardiac & Pulmonary Rehab    Staff Present Mary Jo Abernethy, RN, BSN, MA;Amanda Sommer, BA, ACSM CEP, Exercise Physiologist;Kelly Hayes, BS, ACSM CEP, Exercise Physiologist    Virtual Visit No    Medication changes reported     No    Fall or balance concerns reported    No    Tobacco Cessation No Change    Warm-up and Cool-down Performed on first and last piece of equipment    Resistance Training Performed Yes    VAD Patient? No    PAD/SET Patient? No      Pain Assessment   Currently in Pain? No/denies                Social History   Tobacco Use  Smoking Status Every Day   Packs/day: 1.00   Years: 25.00   Pack years: 25.00   Types: Cigarettes   Last attempt to quit: 07/11/2020   Years since quitting: 0.8  Smokeless Tobacco Never  Tobacco Comments   02/03/21 currently at 1 ppd, [facility] and he is considering it    Goals Met:  Independence with exercise equipment Exercise tolerated well No report of concerns or symptoms today  Goals Unmet:  Not Applicable  Comments: Pt able to follow exercise prescription today without complaint.  Will continue to monitor for progression.    Dr. Mark Miller is Medical Director for [facility]iac [facility].  Dr. Fuad Aleskerov is Medical Director for [facility] Pulmonary Rehabilitation. 

## 2021-05-20 ENCOUNTER — Other Ambulatory Visit: Payer: Self-pay

## 2021-05-20 ENCOUNTER — Encounter: Payer: No Typology Code available for payment source | Attending: Pulmonary Disease

## 2021-05-20 DIAGNOSIS — J449 Chronic obstructive pulmonary disease, unspecified: Secondary | ICD-10-CM | POA: Diagnosis present

## 2021-05-20 NOTE — Progress Notes (Signed)
Daily Session Note  Patient Details  Name: Robert Combs MRN: [id number] Date of Birth: Dec 10, 1945 Referring Provider:   [name] from 02/03/2021 in [facility]  Referring Provider Danton Sewer MD  [facility]]       Encounter Date: 05/20/2021  Check In:  Session Check In - 05/20/21 0715       Check-In   Supervising physician immediately available to respond to emergencies See telemetry face sheet for immediately available ER MD    Location [facility]-Cardiac & Pulmonary Rehab    Staff Present Birdie Sons, MPA, RN;Amanda Sommer, BA, ACSM CEP, Exercise Physiologist    Virtual Visit No    Medication changes reported     No    Fall or balance concerns reported    No    Tobacco Cessation No Change    Warm-up and Cool-down Performed on first and last piece of equipment    Resistance Training Performed Yes    VAD Patient? No    PAD/SET Patient? No      Pain Assessment   Currently in Pain? No/denies                Social History   Tobacco Use  Smoking Status Every Day   Packs/day: 1.00   Years: 25.00   Pack years: 25.00   Types: Cigarettes   Last attempt to quit: 07/11/2020   Years since quitting: 0.8  Smokeless Tobacco Never  Tobacco Comments   02/03/21 currently at 1 ppd, [facility] and [facility] is [facility] it    Goals Met:  Independence with exercise equipment Exercise tolerated well No report of concerns or symptoms today Strength training completed today  Goals Unmet:  Not Applicable  Comments: Pt able to follow exercise prescription today without complaint.  Will continue to monitor for progression.    Dr. Emily Filbert is Medical Director for [place].  Dr. Ottie Glazier is Medical Director for [facility] Pulmonary Rehabilitation.

## 2021-05-21 ENCOUNTER — Encounter: Payer: Self-pay | Admitting: *Deleted

## 2021-05-21 DIAGNOSIS — J449 Chronic obstructive pulmonary disease, unspecified: Secondary | ICD-10-CM

## 2021-05-21 NOTE — Progress Notes (Signed)
Pulmonary Individual Treatment Plan  Patient Details  Name: Robert Combs MRN: [id number] Date of Birth: 10-13-1945 Referring Provider:   Doristine Devoid Pulmonary Rehab from 02/03/2021 in [facility] Cardiac and Pulmonary Rehab  Referring Provider Marcelyn Bruins MD  [facility]       Initial Encounter Date:  Flowsheet Row Pulmonary Rehab from 02/03/2021 in [facility] Cardiac and Pulmonary Rehab  Date 02/03/21       Visit Diagnosis: Chronic obstructive pulmonary disease, unspecified COPD type (HCC)  Patient's Home Medications on Admission:  Current Outpatient Medications:    albuterol (PROVENTIL HFA;VENTOLIN HFA) 108 (90 Base) MCG/ACT inhaler, Inhale 1 puff into the lungs every 6 (six) hours as needed for wheezing or shortness of breath., Disp: , Rfl:    albuterol (VENTOLIN HFA) 108 (90 Base) MCG/ACT inhaler, Inhale into the lungs., Disp: , Rfl:    Alogliptin Benzoate 25 MG TABS, Take by mouth., Disp: , Rfl:    Ascorbic Acid (VITAMIN C) 100 MG tablet, Take 100 mg by mouth daily. (Patient not taking: Reported on 01/24/2021), Disp: , Rfl:    aspirin EC 81 MG tablet, Take 81 mg by mouth daily.  , Disp: , Rfl:    benzonatate (TESSALON PERLES) 100 MG capsule, Take 1 capsule (100 mg total) by mouth every 6 (six) hours as needed for cough. (Patient not taking: Reported on 01/24/2021), Disp: 30 capsule, Rfl: 0   budesonide-formoterol (SYMBICORT) 160-4.5 MCG/ACT inhaler, Inhale into the lungs., Disp: , Rfl:    cholecalciferol (VITAMIN D) 1000 UNITS tablet, Take 1,000 Units by mouth daily.  , Disp: , Rfl:    losartan (COZAAR) 50 MG tablet, Take 25 mg by mouth daily., Disp: , Rfl:    metFORMIN (GLUCOPHAGE) 500 MG tablet, Take 1 tablet (500 mg) PO daily x 1 week, then increase to 1 tablet PO BID thereafter, Disp: 60 tablet, Rfl: 0   mometasone (ASMANEX) 220 MCG/INH inhaler, Take by mouth., Disp: , Rfl:    omeprazole (PRILOSEC OTC) 20 MG tablet, Take 20 mg by mouth daily., Disp: , Rfl:    traMADol (ULTRAM)  50 MG tablet, Take 1 tablet (50 mg total) by mouth every 6 (six) hours as needed., Disp: 10 tablet, Rfl: 0  Past Medical History: Past Medical History:  Diagnosis Date   COPD (chronic obstructive pulmonary disease) (HCC)    Coronary artery disease    DDD (degenerative disc disease)    Hypertension     Tobacco Use: Social History   Tobacco Use  Smoking Status Every Day   Packs/day: 1.00   Years: 25.00   Pack years: 25.00   Types: Cigarettes   Last attempt to quit: 07/11/2020   Years since quitting: 0.8  Smokeless Tobacco Never  Tobacco Comments   02/03/21 currently at 1 ppd, [facility] is [facility]    Labs: Recent Review Flowsheet Data     Labs for [facility]P Cardiac and Pulmonary Rehab Latest Ref Rng & Units 02/03/2010 11/04/2015   HCO3 20.0 - 24.0 mEq/L - 26.3(H)   TCO2 0 - 100 mmol/L 31 28   O2SAT % - 75.0        Pulmonary Assessment Scores:  Pulmonary Assessment Scores     Row Name 02/03/21 1328         ADL UCSD   ADL Phase Entry     SOB Score total 57     Rest 3     Walk 4     Stairs 5  Bath 0     Dress 0     Shop 2           CAT Score   CAT Score 37           mMRC Score   mMRC Score 0              UCSD: Self-administered rating of dyspnea associated with activities of daily living (ADLs) 6-point scale (0 = "not at all" to 5 = "maximal or unable to do because of breathlessness")  Scoring Scores range from 0 to 120.  Minimally important difference is 5 units  CAT: CAT can identify the health impairment of COPD patients and is better correlated with disease progression.  CAT has a scoring range of zero to 40. The CAT score is classified into four groups of low (less than 10), medium (10 - 20), high (21-30) and very high (31-40) based on the impact level of disease on health status. A CAT score over 10 suggests significant symptoms.  A worsening CAT score could be explained by an exacerbation, poor medication adherence, poor  inhaler technique, or progression of COPD or comorbid conditions.  CAT MCID is 2 points  mMRC: mMRC (Modified Medical Research Council) Dyspnea Scale is used to assess the degree of baseline functional disability in patients of respiratory disease due to dyspnea. No minimal important difference is established. A decrease in score of 1 point or greater is considered a positive change.   Pulmonary Function Assessment:  Pulmonary Function Assessment - 02/03/21 1327       Breath   Shortness of Breath Yes;Fear of Shortness of Breath;Limiting activity             Exercise Target Goals: Exercise Program Goal: Individual exercise prescription set using results from initial 6 min walk test and THRR while considering  patient's activity barriers and safety.   Exercise Prescription Goal: Initial exercise prescription builds to 30-45 minutes a day of aerobic activity, 2-3 days per week.  Home exercise guidelines will be given to patient during program as part of exercise prescription that the participant will acknowledge.  Education: Aerobic Exercise: - Group verbal and visual presentation on the components of exercise prescription. Introduces F.I.T.T principle from ACSM for exercise prescriptions.  Reviews F.I.T.T. principles of aerobic exercise including progression. Written material given at graduation. Flowsheet Row Pulmonary Rehab from 04/17/2021 in [facility] Cardiac and Pulmonary Rehab  Date 02/06/21  Educator [facility]  Instruction Review Code 1- Verbalizes Understanding       Education: Resistance Exercise: - Group verbal and visual presentation on the components of exercise prescription. Introduces F.I.T.T principle from ACSM for exercise prescriptions  Reviews F.I.T.T. principles of resistance exercise including progression. Written material given at graduation. Flowsheet Row Pulmonary Rehab from 04/17/2021 in [facility] Cardiac and Pulmonary Rehab  Date 04/17/21  Educator [facility]  Instruction Review  Code 1- Verbalizes Understanding        Education: Exercise & Equipment Safety: - Individual verbal instruction and demonstration of equipment use and safety with use of the equipment. Flowsheet Row Pulmonary Rehab from 04/17/2021 in [facility] Cardiac and Pulmonary Rehab  Date 02/03/21  Educator [facility]  Instruction Review Code 1- Verbalizes Understanding       Education: Exercise Physiology & General Exercise Guidelines: - Group verbal and written instruction with models to review the exercise physiology of the cardiovascular system and associated critical values. Provides general exercise guidelines with specific guidelines to those with heart or lung disease.  Education: Flexibility, Balance, Mind/Body Relaxation: - Group verbal and visual presentation with interactive activity on the components of exercise prescription. Introduces F.I.T.T principle from ACSM for exercise prescriptions. Reviews F.I.T.T. principles of flexibility and balance exercise training including progression. Also discusses the mind body connection.  Reviews various relaxation techniques to help reduce and manage stress (i.e. Deep breathing, progressive muscle relaxation, and visualization). Balance handout provided to take home. Written material given at graduation. Flowsheet Row Pulmonary Rehab from 04/17/2021 in [facility] Cardiac and Pulmonary Rehab  Date 02/20/21  Educator [facility]  Instruction Review Code 1- Verbalizes Understanding       Activity Barriers & Risk Stratification:  Activity Barriers & Cardiac Risk Stratification - 02/03/21 1154       Activity Barriers & Cardiac Risk Stratification   Activity Barriers Shortness of Breath;Joint Problems;Deconditioning;Muscular Weakness;Balance Concerns             6 Minute Walk:  6 Minute Walk     Row Name 02/03/21 1152         6 Minute Walk   Phase Initial     Distance 1380 feet     Walk Time 6 minutes     # of Rest Breaks 0     MPH 2.61     METS 2.72      RPE 11     Perceived Dyspnea  3     VO2 Peak 9.51     Symptoms Yes (comment)     Comments legs burning, SOB     Resting HR 60 bpm     Resting BP 122/62     Resting Oxygen Saturation  94 %     Exercise Oxygen Saturation  during 6 min walk 91 %     Max Ex. HR 83 bpm     Max Ex. BP 156/74     2 Minute Post BP 132/70           Interval HR   1 Minute HR 59     2 Minute HR 66     3 Minute HR 67     4 Minute HR 82     5 Minute HR 83     6 Minute HR 70     2 Minute Post HR 65     Interval Heart Rate? Yes           Interval Oxygen   Interval Oxygen? Yes     Baseline Oxygen Saturation % 94 %     1 Minute Oxygen Saturation % 93 %     1 Minute Liters of Oxygen 0 L  Room Air     2 Minute Oxygen Saturation % 92 %     2 Minute Liters of Oxygen 0 L     3 Minute Oxygen Saturation % 91 %     3 Minute Liters of Oxygen 0 L     4 Minute Oxygen Saturation % 92 %     4 Minute Liters of Oxygen 0 L     5 Minute Oxygen Saturation % 91 %     5 Minute Liters of Oxygen 0 L     6 Minute Oxygen Saturation % 91 %     6 Minute Liters of Oxygen 0 L     2 Minute Post Oxygen Saturation % 96 %     2 Minute Post Liters of Oxygen 0 L             Oxygen Initial Assessment:  Oxygen Initial Assessment - 02/03/21 1327       Home Oxygen   Home Oxygen Device None    Sleep Oxygen Prescription None    Home Exercise Oxygen Prescription None    Home Resting Oxygen Prescription None      Initial 6 min Walk   Oxygen Used None      Program Oxygen Prescription   Program Oxygen Prescription None      Intervention   Short Term Goals To learn and demonstrate proper pursed lip breathing techniques or other breathing techniques. ;To learn and demonstrate proper use of respiratory medications;To learn and understand importance of maintaining oxygen saturations>88%;To learn and understand importance of monitoring SPO2 with pulse oximeter and demonstrate accurate use of the pulse oximeter.    Long  Term Goals  Exhibits proper breathing techniques, such as pursed lip breathing or other method taught during program session;Compliance with respiratory medication;Maintenance of O2 saturations>88%;Verbalizes importance of monitoring SPO2 with pulse oximeter and return demonstration             Oxygen Re-Evaluation:  Oxygen Re-Evaluation     Row Name 02/04/21 0718 03/04/21 0737 04/01/21 0733 04/29/21 0739       Program Oxygen Prescription   Program Oxygen Prescription None None None None         Home Oxygen   Home Oxygen Device None None None None    Sleep Oxygen Prescription None None None None    Home Exercise Oxygen Prescription None None None None    Home Resting Oxygen Prescription None None None None    Compliance with Home Oxygen Use Yes -- Yes --         Goals/Expected Outcomes   Short Term Goals To learn and demonstrate proper pursed lip breathing techniques or other breathing techniques. ;To learn and understand importance of maintaining oxygen saturations>88%;To learn and understand importance of monitoring SPO2 with pulse oximeter and demonstrate accurate use of the pulse oximeter. To learn and demonstrate proper pursed lip breathing techniques or other breathing techniques. ;To learn and understand importance of maintaining oxygen saturations>88%;To learn and understand importance of monitoring SPO2 with pulse oximeter and demonstrate accurate use of the pulse oximeter. To learn and demonstrate proper pursed lip breathing techniques or other breathing techniques. ;To learn and understand importance of maintaining oxygen saturations>88%;To learn and understand importance of monitoring SPO2 with pulse oximeter and demonstrate accurate use of the pulse oximeter. To learn and understand importance of monitoring SPO2 with pulse oximeter and demonstrate accurate use of the pulse oximeter.;To learn and understand importance of maintaining oxygen saturations>88%;To learn and demonstrate proper  pursed lip breathing techniques or other breathing techniques. ;To learn and demonstrate proper use of respiratory medications    Long  Term Goals Exhibits proper breathing techniques, such as pursed lip breathing or other method taught during program session;Compliance with respiratory medication;Maintenance of O2 saturations>88%;Verbalizes importance of monitoring SPO2 with pulse oximeter and return demonstration Exhibits proper breathing techniques, such as pursed lip breathing or other method taught during program session;Compliance with respiratory medication;Maintenance of O2 saturations>88%;Verbalizes importance of monitoring SPO2 with pulse oximeter and return demonstration Exhibits proper breathing techniques, such as pursed lip breathing or other method taught during program session;Compliance with respiratory medication;Maintenance of O2 saturations>88%;Verbalizes importance of monitoring SPO2 with pulse oximeter and return demonstration Verbalizes importance of monitoring SPO2 with pulse oximeter and return demonstration;Maintenance of O2 saturations>88%;Exhibits proper breathing techniques, such as pursed lip breathing or other method taught during program session;Compliance with respiratory  medication    Comments Reviewed PLB technique with pt.  Talked about how it works and it's importance in maintaining their exercise saturations. Robert Combs is doing well in rehab. His breathing is getting better, but he is stil smoking and his chest feels tight.  We talked about quitting smoking to help. He does use his PLB with exercise. Robert Combs reports his breathing is about the time. He does not have a pulse oximeter at home, discussed importance of checking oxygen. He continues to practice PLB and feels that it helps. He is still smoking, he is smoking the same amount, but is interested in quitting. He has quit 4-5 times in 3 years, when he feels triggered he will start again. Robert Combs is doing well with rehab.  His  breathing is getting better and monitors his sats at home.  He uses his PLB when he feels short of breath but not before then.    Goals/Expected Outcomes Short: Become more profiecient at using PLB.   Long: Become independent at using PLB. short: Continue to work on breathing Long: Use PLB more frequently short: Think about a quit date Long: Use PLB more frequently short: Think about a quit date Long: Use PLB more frequently             Oxygen Discharge (Final Oxygen Re-Evaluation):  Oxygen Re-Evaluation - 04/29/21 0739       Program Oxygen Prescription   Program Oxygen Prescription None      Home Oxygen   Home Oxygen Device None    Sleep Oxygen Prescription None    Home Exercise Oxygen Prescription None    Home Resting Oxygen Prescription None      Goals/Expected Outcomes   Short Term Goals To learn and understand importance of monitoring SPO2 with pulse oximeter and demonstrate accurate use of the pulse oximeter.;To learn and understand importance of maintaining oxygen saturations>88%;To learn and demonstrate proper pursed lip breathing techniques or other breathing techniques. ;To learn and demonstrate proper use of respiratory medications    Long  Term Goals Verbalizes importance of monitoring SPO2 with pulse oximeter and return demonstration;Maintenance of O2 saturations>88%;Exhibits proper breathing techniques, such as pursed lip breathing or other method taught during program session;Compliance with respiratory medication    Comments Robert Combs is doing well with rehab.  His breathing is getting better and monitors his sats at home.  He uses his PLB when he feels short of breath but not before then.    Goals/Expected Outcomes short: Think about a quit date Long: Use PLB more frequently             Initial Exercise Prescription:  Initial Exercise Prescription - 02/03/21 1300       Date of Initial Exercise RX and Referring Provider   Date 02/03/21    Referring Provider  Marcelyn Bruins MD   [facility]     Treadmill   MPH 2.2    Grade 0.5    Minutes 15    METs 2.84      Recumbant Bike   Level 2    RPM 50    Watts 22    Minutes 15    METs 2.6      Biostep-RELP   Level 2    SPM 50    Minutes 15    METs 2      Track   Laps 30    Minutes 15    METs 2.6      Prescription Details   Frequency (times  per week) 2    Duration Progress to 30 minutes of continuous aerobic without signs/symptoms of physical distress      Intensity   THRR 40-80% of Max Heartrate 95-130    Ratings of Perceived Exertion 11-13    Perceived Dyspnea 0-4      Progression   Progression Continue to progress workloads to maintain intensity without signs/symptoms of physical distress.      Resistance Training   Training Prescription Yes    Weight 4 lb    Reps 10-15             Perform Capillary Blood Glucose checks as needed.  Exercise Prescription Changes:   Exercise Prescription Changes     Row Name 02/03/21 1300 02/18/21 1500 03/05/21 1700 03/18/21 0700 04/01/21 1400     Response to Exercise   Blood Pressure (Admit) 122/62 98/54 110/64 128/64 122/62   Blood Pressure (Exercise) 156/74 120/60 138/66 132/62 --   Blood Pressure (Exit) 110/66 112/60 124/64 112/66 130/82   Heart Rate (Admit) 60 bpm 65 bpm 58 bpm 88 bpm 52 bpm   Heart Rate (Exercise) 83 bpm 94 bpm 96 bpm 93 bpm 88 bpm   Heart Rate (Exit) 62 bpm 61 bpm 66 bpm 80 bpm 73 bpm   Oxygen Saturation (Admit) 94 % 89 % 94 % 94 % 94 %   Oxygen Saturation (Exercise) 91 % 90 % 93 % 91 % 92 %   Oxygen Saturation (Exit) 94 % 95 % 95 % 94 % 93 %   Rating of Perceived Exertion (Exercise) Perceived Dyspnea (Exercise) 3 0 2 0 1   Symptoms SOB, legs burning none -- none none   Comments walk test results -- -- -- --   Duration -- Progress to 30 minutes of  aerobic without signs/symptoms of physical distress Continue with 30 min of aerobic exercise without signs/symptoms of physical distress.  Continue with 30 min of aerobic exercise without signs/symptoms of physical distress. Continue with 30 min of aerobic exercise without signs/symptoms of physical distress.   Intensity -- THRR unchanged THRR unchanged THRR unchanged THRR unchanged     Progression   Progression -- Continue to progress workloads to maintain intensity without signs/symptoms of physical distress. Continue to progress workloads to maintain intensity without signs/symptoms of physical distress. Continue to progress workloads to maintain intensity without signs/symptoms of physical distress. Continue to progress workloads to maintain intensity without signs/symptoms of physical distress.   Average METs -- 2.4 2.2 3.02 3     Resistance Training   Training Prescription -- Yes Yes Yes Yes   Weight -- 4 lb 5 lb 5 lb 6 lb   Reps -- 10-15 10-15 10-15 10-15     Interval Training   Interval Training -- No No No No     Treadmill   MPH -- 2 -- 2.5 --   Grade -- 0 -- 0.5 --   Minutes -- 15 -- 15 --   METs -- 2.53 -- 3.09 --     Recumbant Bike   Level -- 2 -- 5 --   Watts -- 19 -- 33 --   Minutes -- 15 -- 15 --   METs -- 2.66 -- 3.1 --     Biostep-RELP   Level -- 2 2 -- 5   Minutes -- 15 15 -- 15   METs -- 2 2 -- 3     Track   Laps -- 27 27 --  39   Minutes -- 15 15 -- 15   METs -- 2.47 2.47 -- 3.12    Row Name 04/14/21 1500 04/29/21 1300 05/13/21 1200         Response to Exercise   Blood Pressure (Admit) 126/64 110/62 124/62     Blood Pressure (Exit) 132/82 110/64 128/58     Heart Rate (Admit) 87 bpm 50 bpm 50 bpm     Heart Rate (Exercise) 86 bpm 67 bpm 79 bpm     Heart Rate (Exit) 72 bpm 59 bpm 70 bpm     Oxygen Saturation (Admit) 93 % 94 % 93 %     Oxygen Saturation (Exercise) 89 % 90 % 90 %     Oxygen Saturation (Exit) 95 % 94 % 94 %     Rating of Perceived Exertion (Exercise) 13 12 13      Perceived Dyspnea (Exercise) 1 0 1     Symptoms none none none     Duration Continue with 30 min of aerobic  exercise without signs/symptoms of physical distress. Continue with 30 min of aerobic exercise without signs/symptoms of physical distress. Continue with 30 min of aerobic exercise without signs/symptoms of physical distress.     Intensity THRR unchanged THRR unchanged THRR unchanged           Progression   Progression Continue to progress workloads to maintain intensity without signs/symptoms of physical distress. Continue to progress workloads to maintain intensity without signs/symptoms of physical distress. Continue to progress workloads to maintain intensity without signs/symptoms of physical distress.     Average METs 2.91 3.25 3.12           Resistance Training   Training Prescription Yes Yes Yes     Weight 6 lb 7 lb 7 lb     Reps 10-15 10-15 10-15           Interval Training   Interval Training No No No           Treadmill   MPH 2.7 -- 2.5     Grade 1 -- 1.5     Minutes 15 -- 15     METs 3.44 -- 3.23           Recumbant Bike   Level 3 -- 5     Watts 25 -- 27     Minutes 15 -- 15     METs 3.79 -- 2.83           Biostep-RELP   Level 5 5 5      Minutes 15 15 15      METs 2 3 3            Track   Laps 44 46 41     Minutes 15 15 15      METs 3.39 3.5 3.23           Home Exercise Plan   Plans to continue exercise at Home (comment)  walking Home (comment)  walking Home (comment)  walking     Frequency Add 2 additional days to program exercise sessions. Add 2 additional days to program exercise sessions. Add 2 additional days to program exercise sessions.     Initial Home Exercises Provided 03/04/21 03/04/21 03/04/21           Oxygen   Maintain Oxygen Saturation -- -- 88% or higher              Exercise Comments:   Exercise Comments     Row  Name 02/04/21 1610           Exercise Comments First full day of exercise!  Patient was oriented to gym and equipment including functions, settings, policies, and procedures.  Patient's individual exercise  prescription and treatment plan were reviewed.  All starting workloads were established based on the results of the 6 minute walk test done at initial orientation visit.  The plan for exercise progression was also introduced and progression will be customized based on patient's performance and goals.                Exercise Goals and Review:   Exercise Goals     Row Name 02/03/21 1319             Exercise Goals   Increase Physical Activity Yes       Intervention Provide advice, education, support and counseling about physical activity/exercise needs.;Develop an individualized exercise prescription for aerobic and resistive training based on initial evaluation findings, risk stratification, comorbidities and participant's personal goals.       Expected Outcomes Short Term: Attend rehab on a regular basis to increase amount of physical activity.;Long Term: Add in home exercise to make exercise part of routine and to increase amount of physical activity.;Long Term: Exercising regularly at least 3-5 days a week.       Increase Strength and Stamina Yes       Intervention Provide advice, education, support and counseling about physical activity/exercise needs.;Develop an individualized exercise prescription for aerobic and resistive training based on initial evaluation findings, risk stratification, comorbidities and participant's personal goals.       Expected Outcomes Short Term: Increase workloads from initial exercise prescription for resistance, speed, and METs.;Short Term: Perform resistance training exercises routinely during rehab and add in resistance training at home;Long Term: Improve cardiorespiratory fitness, muscular endurance and strength as measured by increased METs and functional capacity ( )       Able to understand and use rate of perceived exertion (RPE) scale Yes       Intervention Provide education and explanation on how to use RPE scale       Expected Outcomes Short  Term: Able to use RPE daily in rehab to express subjective intensity level;Long Term:  Able to use RPE to guide intensity level when exercising independently       Able to understand and use Dyspnea scale Yes       Intervention Provide education and explanation on how to use Dyspnea scale       Expected Outcomes Short Term: Able to use Dyspnea scale daily in rehab to express subjective sense of shortness of breath during exertion;Long Term: Able to use Dyspnea scale to guide intensity level when exercising independently       Knowledge and understanding of Target Heart Rate Range (THRR) Yes       Intervention Provide education and explanation of THRR including how the numbers were predicted and where they are located for reference       Expected Outcomes Short Term: Able to use daily as guideline for intensity in rehab;Short Term: Able to state/look up THRR;Long Term: Able to use THRR to govern intensity when exercising independently       Able to check pulse independently Yes       Intervention Provide education and demonstration on how to check pulse in carotid and radial arteries.;Review the importance of being able to check your own pulse for safety during independent exercise  Expected Outcomes Short Term: Able to explain why pulse checking is important during independent exercise;Long Term: Able to check pulse independently and accurately       Understanding of Exercise Prescription Yes       Intervention Provide education, explanation, and written materials on patient's individual exercise prescription       Expected Outcomes Short Term: Able to explain program exercise prescription;Long Term: Able to explain home exercise prescription to exercise independently                Exercise Goals Re-Evaluation :  Exercise Goals Re-Evaluation     Row Name 02/04/21 0717 02/18/21 1509 03/04/21 0746 03/18/21 0749 04/01/21 0732     Exercise Goal Re-Evaluation   Exercise Goals Review  Knowledge and understanding of Target Heart Rate Range (THRR);Increase Physical Activity;Able to understand and use rate of perceived exertion (RPE) scale;Understanding of Exercise Prescription;Able to understand and use Dyspnea scale;Able to check pulse independently;Increase Strength and Stamina Increase Physical Activity;Increase Strength and Stamina;Understanding of Exercise Prescription Increase Physical Activity;Increase Strength and Stamina;Understanding of Exercise Prescription;Able to understand and use rate of perceived exertion (RPE) scale;Able to understand and use Dyspnea scale;Knowledge and understanding of Target Heart Rate Range (THRR);Able to check pulse independently Increase Physical Activity;Increase Strength and Stamina;Understanding of Exercise Prescription Increase Physical Activity;Increase Strength and Stamina;Understanding of Exercise Prescription   Comments Reviewed RPE and dyspnea scales, THR and program prescription with pt today.  Pt voiced understanding and was given a copy of goals to take home. Robert Combs is off to a good start in rehab.  He has only completed his first 4 full days of exercise thus far.  He is at 2.0 mph on the treadmill.  We will continue to monitor his progress and encourage good attendance. Robert Combs is doing well in rehab.  He is have some symptoms of depression and we talked about exercising can be helpful.  Reviewed home exercise with pt today.  Pt plans to walk at home and use staff videos for exercise.  Reviewed THR, pulse, RPE, sign and symptoms, pulse oximetery and when to call 911 or MD.  Also discussed weather considerations and indoor options.  Pt voiced understanding. Robert Combs does not have the most consistent attendance due to his work that takes him out of town frequently.  He does do well in rehab when present.  He is up to level 5 on the recumbent bike.  We continue to monitor his progress. Robert Combs reports walking a thome 30 minutes on the days he does not  come here (RPE 12). He reports he is not as consistent as he would like to be. Discussed adding warm up and cool down and monitoring O2.   Expected Outcomes Short: Use RPE daily to regulate intensity. Long: Follow program prescription in THR. Short: Attend rehab regularly Long: Continue to improve stamina. Short: get into routine with exercise Long: Continue to improve stamina Short: walk when out of town to maintain Long: Conitnue to improve stamina. Short: walk when not at rehab consistently Long: Conitnue to improve stamina.    Row Name 04/14/21 1456 04/29/21 0732 04/29/21 1316 05/13/21 1229       Exercise Goal Re-Evaluation   Exercise Goals Review Increase Physical Activity;Increase Strength and Stamina;Understanding of Exercise Prescription Increase Physical Activity;Increase Strength and Stamina;Understanding of Exercise Prescription Increase Physical Activity;Increase Strength and Stamina Increase Physical Activity;Increase Strength and Stamina;Understanding of Exercise Prescription    Comments Robert Combs is doing well in rehab. He is up to 44 laps  on the track which is over a mile!  We will continue to monitor his progress. Robert Combs is doing well in rehab. The routine of coming to class helps.  He does yard work once a week to keep active.  We talked about trying to exericse more for the mental boost. -- Robert Combs is doing well in rehab.  He enjoys coming to class but is not as active when not here still.  He is up to 41 laps today!  We will continue to monitor his progress.    Expected Outcomes Short: Continue to walk more Long: Continue to improve stamina Short: More exericse on off days Long: Continue to improve stamina. -- Short: Try to add in more exercise Long: Continue to improve stmaina.             Discharge Exercise Prescription (Final Exercise Prescription Changes):  Exercise Prescription Changes - 05/13/21 1200       Response to Exercise   Blood Pressure (Admit) 124/62    Blood  Pressure (Exit) 128/58    Heart Rate (Admit) 50 bpm    Heart Rate (Exercise) 79 bpm    Heart Rate (Exit) 70 bpm    Oxygen Saturation (Admit) 93 %    Oxygen Saturation (Exercise) 90 %    Oxygen Saturation (Exit) 94 %    Rating of Perceived Exertion (Exercise) 13    Perceived Dyspnea (Exercise) 1    Symptoms none    Duration Continue with 30 min of aerobic exercise without signs/symptoms of physical distress.    Intensity THRR unchanged      Progression   Progression Continue to progress workloads to maintain intensity without signs/symptoms of physical distress.    Average METs 3.12      Resistance Training   Training Prescription Yes    Weight 7 lb    Reps 10-15      Interval Training   Interval Training No      Treadmill   MPH 2.5    Grade 1.5    Minutes 15    METs 3.23      Recumbant Bike   Level 5    Watts 27    Minutes 15    METs 2.83      Biostep-RELP   Level 5    Minutes 15    METs 3      Track   Laps 41    Minutes 15    METs 3.23      Home Exercise Plan   Plans to continue exercise at Home (comment)   walking   Frequency Add 2 additional days to program exercise sessions.    Initial Home Exercises Provided 03/04/21      Oxygen   Maintain Oxygen Saturation 88% or higher             Nutrition:  Target Goals: Understanding of nutrition guidelines, daily intake of sodium 1500mg , cholesterol 200mg , calories 30% from fat and 7% or less from saturated fats, daily to have 5 or more servings of fruits and vegetables.  Education: All About Nutrition: -Group instruction provided by verbal, written material, interactive activities, discussions, models, and posters to present general guidelines for heart healthy nutrition including fat, fiber, MyPlate, the role of sodium in heart healthy nutrition, utilization of the nutrition label, and utilization of this knowledge for meal planning. Follow up email sent as well. Written material given at  graduation.   Biometrics:  Pre Biometrics - 02/03/21 1320  Pre Biometrics   Height 5' 10.1" (1.781 m)    Weight 213 lb 9.6 oz (96.9 kg)    BMI (Calculated) 30.55    Single Leg Stand 6.5 seconds              Nutrition Therapy Plan and Nutrition Goals:  Nutrition Therapy & Goals - 02/05/21 0948       Nutrition Therapy   Diet Heart healthy, low Na, diabetes friendly    Protein (specify units) 110-115g    Fiber 30 grams    Whole Grain Foods 3 servings    Saturated Fats 12 max. grams    Fruits and Vegetables 8 servings/day    Sodium 1.5 grams      Personal Nutrition Goals   Nutrition Goal ST: add mid morning snakc to avoid probable low BG and intense hunger, check BG at least 1x/day. LT: maintain BG during the day, choose healthier options that follow MyPlate stucture when eating out for lunch.    Comments He doesn't normally check his BG at home. His MD said his BG was good when he was last in per [place]. He reports no shortness of breath during or after eating. He reports that his appetite is lower than used to be. 221lb--> 214lbs in 3 weeks and feels the difference in his clothes. He feels his appetite was the same over this time. B: weekend: bacon and egg sandwich (1/2) L: go out: vegetables and meat or sandwiches (hamburger, hot dog, bolonga sandwich) D: cabbage and green beans, baked chicken, pork chop, corn, potatoes -baked, spaghetti with meat sauce and salad (not fond of pasta), he does not like rice, uses butter and olive oil, he reports not liking salt so she will use a little bit of salt - girlfriend cooks. He reports waiting until he gets "extremely hungry" to eat. He feels hunger when his legs get weak or he has stomach pains. He feels it's hard for him to get balanced meal at work when he goes out for lunch. We will start with a mid morning snack to avoid extreme hunger and probable low BG and then move to modifying lunch. Discussed importance of checking BG - he  reports having all the tools to check and what to do for low BG. Discussed heart healthy eaitng, diabetes friendly eating, and COPD friendly eating.      Intervention Plan   Intervention Prescribe, educate and counsel regarding individualized specific dietary modifications aiming towards targeted core components such as weight, hypertension, lipid management, diabetes, heart failure and other comorbidities.    Expected Outcomes Short Term Goal: Understand basic principles of dietary content, such as calories, fat, sodium, cholesterol and nutrients.;Short Term Goal: A plan has been developed with personal nutrition goals set during dietitian appointment.;Long Term Goal: Adherence to prescribed nutrition plan.             Nutrition Assessments:  MEDIFICTS Score Key: ?70 Need to make dietary changes  40-70 Heart Healthy Diet ? 40 Therapeutic Level Cholesterol Diet  Flowsheet Row Pulmonary Rehab from 02/03/2021 in [facility] Card[facility] and Pulmonary Rehab  Picture Your Plate Total Score on Admission 51      Picture Your Plate Scores: <37 Unhealthy dietary pattern with much room for improvement. 41-50 Dietary pattern unlikely to meet recommendations for good health and room for improvement. 51-60 More healthful dietary pattern, with some room for improvement.  >60 Healthy dietary pattern, although there may be some specific behaviors that could be improved.   Nutrition  Goals Re-Evaluation:  Nutrition Goals Re-Evaluation     Row Name 03/04/21 0734 04/01/21 0737 04/29/21 0733         Goals   Current Weight -- -- 212 lb (96.2 kg)     Nutrition Goal ST: add mid morning snakc to avoid probable low BG and intense hunger, check BG at least 1x/day. LT: maintain BG during the day, choose healthier options that follow MyPlate stucture when eating out for lunch. ST: add protein to mid morning snack to avoid probable low BG and intense hunger, check BG at least 1x/day. LT: maintain BG during the day,  choose healthier options that follow MyPlate stucture when eating out for lunch. ST: add protein to mid morning snack to avoid probable low BG and intense hunger, check BG at least 1x/day. LT: maintain BG during the day, choose healthier options that follow MyPlate stucture when eating out for lunch.     Comment Robert Combs has not had a lot of appetite.  He is eating once a day.  He has not been checking his sugars like he should.  We talked about adding in a protein shake to help. Continue to try to make a good options. Robert Combs has added a mid morning snack in the form of some fruit. He does not have a BG monitor at home. He will eat a peanut butter and jelly sandwich when he is hungry. Is not interested in protein shakes as he feels they are for old people, but is willing to try protein bars or soymilk or nuts and seeds with fruit. He reports his appetite continues to stay low. Robert Combs has been losing some weight since just before rehab. He is at 212 lb today.  He has not really been focusing on protein so we talked about it again and the importance of why he needs it.  He is still only eating one meal so we talked about using a protein drink.     Expected Outcome Short: Try to eat more frequently and try protein shake Long: Continue to make healthy options. ST: add protein to mid morning snack to avoid probable low BG and intense hunger, check BG at least 1x/day. LT: maintain BG during the day, choose healthier options that follow MyPlate stucture when eating out for lunch. Short: Focus on getting more protein Long: Continue to find balance planning.              Nutrition Goals Discharge (Final Nutrition Goals Re-Evaluation):  Nutrition Goals Re-Evaluation - 04/29/21 0733       Goals   Current Weight 212 lb (96.2 kg)    Nutrition Goal ST: add protein to mid morning snack to avoid probable low BG and intense hunger, check BG at least 1x/day. LT: maintain BG during the day, choose healthier options that  follow MyPlate stucture when eating out for lunch.    Comment Robert Combs has been losing some weight since just before rehab. He is at 212 lb today.  He has not really been focusing on protein so we talked about it again and the importance of why he needs it.  He is still only eating one meal so we talked about using a protein drink.    Expected Outcome Short: Focus on getting more protein Long: Continue to find balance planning.             Psychosocial: Target Goals: Acknowledge presence or absence of significant depression and/or stress, maximize coping skills, provide positive support system. Participant is  able to verbalize types and ability to use techniques and skills needed for reducing stress and depression.   Education: Stress, Anxiety, and Depression - Group verbal and visual presentation to define topics covered.  Reviews how body is impacted by stress, anxiety, and depression.  Also discusses healthy ways to reduce stress and to treat/manage anxiety and depression.  Written material given at graduation.   Education: Sleep Hygiene -Provides group verbal and written instruction about how sleep can affect your health.  Define sleep hygiene, discuss sleep cycles and impact of sleep habits. Review good sleep hygiene tips.    Initial Review & Psychosocial Screening:  Initial Psych Review & Screening - 01/24/21 1541       Initial Review   Current issues with Current Stress Concerns    Source of Stress Concerns Family    Comments children stress him. he as MD psychiatrist at [facility]. 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      Family Dynamics   Good Support System? Yes   2 daughters, one son (one in [place] calls everyday other in [place]) and a sister.  .     Barriers   Psychosocial barriers to participate in program There are no identifiable barriers or psychosocial needs.;The patient should benefit from training in stress management and relaxation.      Screening Interventions    Interventions Provide feedback about the scores to participant;To provide support and resources with identified psychosocial needs;Encouraged to exercise    Expected Outcomes Short Term goal: Utilizing psychosocial counselor, staff and physician to assist with identification of specific Stressors or current issues interfering with healing process. Setting desired goal for each stressor or current issue identified.;Long Term Goal: Stressors or current issues are controlled or eliminated.;Short Term goal: Identification and review with participant of any Quality of Life or Depression concerns found by scoring the questionnaire.;Long Term goal: The participant improves quality of Life and PHQ9 Scores as seen by post scores and/or verbalization of changes             Quality of Life Scores:  Scores of 19 and below usually indicate a poorer quality of life in these areas.  A difference of  2-3 points is a clinically meaningful difference.  A difference of 2-3 points in the total score of the Quality of Life Index has been associated with significant improvement in overall quality of life, self-image, physical symptoms, and general health in studies assessing change in quality of life.  PHQ-9: Recent Review Flowsheet Data     Depression screen [facility] 2/9 04/29/2021 04/01/2021 03/04/2021 02/03/2021   Decreased Interest 2 1 1 3    Down, Depressed, Hopeless 2 2 1 2    PHQ - 2 Score 4 3 2 5    Altered sleeping 3 3 1  3    Tired, decreased energy 2 2 1 3    Change in appetite 2 2 3 3    Feeling bad or failure about yourself  0 0 0 0   Trouble concentrating 2 0 0 0   Moving slowly or fidgety/restless 0 0 0 0   Suicidal thoughts 0 0 0 0   PHQ-9 Score 13 10 7 14    Difficult doing work/chores Not difficult at all Not difficult at all Not difficult at all Not difficult at all      Interpretation of Total Score  Total Score Depression Severity:  1-4 = Minimal depression, 5-9 = Mild depression, 10-14 = Moderate  depression, 15-19 = Moderately severe depression, 20-27 = Severe depression  Psychosocial Evaluation and Intervention:  Psychosocial Evaluation - 01/24/21 1601       Psychosocial Evaluation & Interventions   Interventions Encouraged to exercise with the program and follow exercise prescription    Comments Robert Combs has no barriers to attending the program. He does travel for work and may need to flex his schedule. He lives alone and has children in the area and a girlfriend. He does get stressed and has a psychiatrist at the [place] that he talks to. He is a smoker and is trting to quit. HIs support poepl are his family. He wants to improve his breathing,have more energy and quit smoking.    Expected Outcomes STG Robert Combs is able to attend at least 2 days aweek depite his work schedule. He is able to Quit tobacco. LTG: Robert Combs has quit and maintains tobacco cessation, he is able to continue any progress he made during the program.    Continue Psychosocial Services  Follow up required by staff             Psychosocial Re-Evaluation:  Psychosocial Re-Evaluation     Row Name 03/04/21 0725 04/01/21 0756 04/29/21 0726         Psychosocial Re-Evaluation   Current issues with Current Depression;Current Sleep Concerns;History of Depression;Current Stress Concerns Current Depression;Current Sleep Concerns;History of Depression;Current Stress Concerns;Current Psychotropic Meds Current Depression;Current Sleep Concerns;History of Depression;Current Stress Concerns;Current Psychotropic Meds     Comments Robert Combs is doing well in rehab.  We did his PHQ test again and his scores are better, but still feeling depressed.  He is seeing the pyschiatrist at [place].  He has nights where he doesn't sleep well and that has been going on for more than 6 months but he has not talked to doctor about any of this. He was encouraged to talk with his doctor to help with sleep. His lack of sleep is making him feel tired. He reports  being on medication for anxiety/depression - not sure what it is for exactly or what the medication is - he takes it daily. He sees a psychiatrist who may want to change the medication. He sees a therapist 1x/3 months - he says he cant get in any more frequently due to their schedule; discussed how it would benefit to find a practice he could see more often. His daughter is causing him stress - she always wants something from him. He also finds work depressing. He enjoys working on his yard, but it had been too hard. He also enjoys golfing. He has a smaller appetite and has had for 6-8 months (unsure why). HE has trouble falling asleep and staying asleep - will stay asleep for 1 hour at a time. His MD is going to prescibe him sleep medication; he denies racing thoughts at night and is not sure why he can't sleep. Discussed different kinds of meditation and suggested trying some out. Tanvir is doing well in rehab.  He redid his PHQ today and it had gone up 3 points from last time.  He continues to have some stress from his daughter. He is taking his antidepressants and anxiety pills.  He finds work stressful too.  He stays in the house and sleeps all weekend.  He does have an appointment with psychiastrist on 10/9.  His PTSD is also interferring with his sleep as he keeps waking and then not able to get back to sleep.  He does have sleep pills.  He is going to theapist every three months.  He is not open to going with online platform.  We talked about trying sleep pills on weekends to see if they help.     Expected Outcomes SHort: Talk to doctor about sleep and depression Long: Continue to find the positive. SHort: see how sleep medication helps, see a therapist more frequently, try meditation techniques  Long: Continue to find the positive. Short: Try sleep med on weekend Long: Continue to try to exercise more to sleep better.     Interventions Stress management education;Encouraged to attend Cardiac  Rehabilitation for the exercise Stress management education;Relaxation education;Encouraged to attend Pulmonary Rehabilitation for the exercise Stress management education;Relaxation education;Encouraged to attend Pulmonary Rehabilitation for the exercise     Continue Psychosocial Services  Follow up required by staff Follow up required by staff Follow up required by staff           Initial Review   Source of Stress Concerns -- Family;Occupation --              Psychosocial Discharge (Final Psychosocial Re-Evaluation):  Psychosocial Re-Evaluation - 04/29/21 0726       Psychosocial Re-Evaluation   Current issues with Current Depression;Current Sleep Concerns;History of Depression;Current Stress Concerns;Current Psychotropic Meds    Comments Robert Combs is doing well in rehab.  He redid his PHQ today and it had gone up 3 points from last time.  He continues to have some stress from his daughter. He is taking his antidepressants and anxiety pills.  He finds work stressful too.  He stays in the house and sleeps all weekend.  He does have an appointment with psychiastrist on 10/9.  His PTSD is also interferring with his sleep as he keeps waking and then not able to get back to sleep.  He does have sleep pills.  He is going to theapist every three months.  He is not open to going with online platform.  We talked about trying sleep pills on weekends to see if they help.    Expected Outcomes Short: Try sleep med on weekend Long: Continue to try to exercise more to sleep better.    Interventions Stress management education;Relaxation education;Encouraged to attend Pulmonary Rehabilitation for the exercise    Continue Psychosocial Services  Follow up required by staff             Education: Education Goals: Education classes will be provided on a weekly basis, covering required topics. Participant will state understanding/return demonstration of topics presented.  Learning Barriers/Preferences:   Learning Barriers/Preferences - 01/24/21 1546       Learning Barriers/Preferences   Learning Barriers None    Learning Preferences None             General Pulmonary Education Topics:  Infection Prevention: - Provides verbal and written material to individual with discussion of infection control including proper hand washing and proper equipment cleaning during exercise session. Flowsheet Row Pulmonary Rehab from 04/17/2021 in [facility] Cardiac and Pulmonary Rehab  Date 02/03/21  Educator [facility]  Instruction Review Code 1- Verbalizes Understanding       Falls Prevention: - Provides verbal and written material to individual with discussion of falls prevention and safety. Flowsheet Row Pulmonary Rehab from 04/17/2021 in [facility] Cardiac and Pulmonary Rehab  Date 02/03/21  Educator [facility]  Instruction Review Code 1- Verbalizes Understanding       Chronic Lung Disease Review: - Group verbal instruction with posters, models, PowerPoint presentations and videos,  to review new updates, new respiratory medications, new advancements in  procedures and treatments. Providing information on websites and "800" numbers for continued self-education. Includes information about supplement oxygen, available portable oxygen systems, continuous and intermittent flow rates, oxygen safety, concentrators, and Medicare reimbursement for oxygen. Explanation of Pulmonary Drugs, including class, frequency, complications, importance of spacers, rinsing mouth after steroid MDI's, and proper cleaning methods for nebulizers. Review of basic lung anatomy and physiology related to function, structure, and complications of lung disease. Review of risk factors. Discussion about methods for diagnosing sleep apnea and types of masks and machines for OSA. Includes a review of the use of types of environmental controls: home humidity, furnaces, filters, dust mite/pet prevention, HEPA vacuums. Discussion about weather changes, air quality  and the benefits of nasal washing. Instruction on Warning signs, infection symptoms, calling MD promptly, preventive modes, and value of vaccinations. Review of effective airway clearance, coughing and/or vibration techniques. Emphasizing that all should Create an Action Plan. Written material given at graduation. Flowsheet Row Pulmonary Rehab from 04/17/2021 in [facility] Cardiac and Pulmonary Rehab  Education need identified 02/03/21       AED/CPR: - Group verbal and written instruction with the use of models to demonstrate the basic use of the AED with the basic ABC's of resuscitation.    Anatomy and Cardiac Procedures: - Group verbal and visual presentation and models provide information about basic cardiac anatomy and function. Reviews the testing methods done to diagnose heart disease and the outcomes of the test results. Describes the treatment choices: Medical Management, Angioplasty, or Coronary Bypass Surgery for treating various heart conditions including Myocardial Infarction, Angina, Valve Disease, and Cardiac Arrhythmias.  Written material given at graduation. Flowsheet Row Pulmonary Rehab from 04/17/2021 in [facility] Cardiac and Pulmonary Rehab  Date 04/17/21  Educator KB  Instruction Review Code 1- Verbalizes Understanding       Medication Safety: - Group verbal and visual instruction to review commonly prescribed medications for heart and lung disease. Reviews the medication, class of the drug, and side effects. Includes the steps to properly store meds and maintain the prescription regimen.  Written material given at graduation. Flowsheet Row Pulmonary Rehab from 04/17/2021 in [facility] Cardiac and Pulmonary Rehab  Date 03/06/21  Educator SB  Instruction Review Code 1- Verbalizes Understanding       Other: -Provides group and verbal instruction on various topics (see comments)   Knowledge Questionnaire Score:  Knowledge Questionnaire Score - 02/03/21 1323       Knowledge  Questionnaire Score   Pre Score 13/16 Education Focus: Chonic Lung Disease, O2 safety              Core Components/Risk Factors/Patient Goals at Admission:  Personal Goals and Risk Factors at Admission - 02/03/21 1323       Core Components/Risk Factors/Patient Goals on Admission    Weight Management Yes;Weight Maintenance;Obesity    Intervention Weight Management: Provide education and appropriate resources to help participant work on and attain dietary goals.;Weight Management: Develop a combined nutrition and exercise program designed to reach desired caloric intake, while maintaining appropriate intake of nutrient and fiber, sodium and fats, and appropriate energy expenditure required for the weight goal.;Weight Management/Obesity: Establish reasonable short term and long term weight goals.;Obesity: Provide education and appropriate resources to help participant work on and attain dietary goals.    Admit Weight 213 lb 9.6 oz (96.9 kg)    Goal Weight: Short Term 208 lb (94.3 kg)    Goal Weight: Long Term 200 lb (90.7 kg)    Expected Outcomes Short  Term: Continue to assess and modify interventions until short term weight is achieved;Long Term: Adherence to nutrition and physical activity/exercise program aimed toward attainment of established weight goal;Weight Loss: Understanding of general recommendations for a balanced deficit meal plan, which promotes 1-2 lb weight loss per week and includes a negative energy balance of [phone] kcal/d;Understanding recommendations for meals to include 15-35% energy as protein, 25-35% energy from fat, 35-60% energy from carbohydrates, less than 200mg  of dietary cholesterol, 20-35 gm of total fiber daily;Understanding of distribution of calorie intake throughout the day with the consumption of 4-5 meals/snacks    Tobacco Cessation Yes    Number of packs per day 1 per day    Intervention Assist the participant in steps to quit. Provide individualized  education and counseling about committing to Tobacco Cessation, relapse prevention, and pharmacological support that can be provided by physician.;Education officer, environmental, assist with locating and accessing local/national Quit Smoking programs, and support quit date choice.    Expected Outcomes Short Term: Will demonstrate readiness to quit, by selecting a quit date.;Short Term: Will quit all tobacco product use, adhering to prevention of relapse plan.;Long Term: Complete abstinence from all tobacco products for at least 12 months from quit date.    Improve shortness of breath with ADL's Yes    Intervention Provide education, individualized exercise plan and daily activity instruction to help decrease symptoms of SOB with activities of daily living.    Expected Outcomes Short Term: Improve cardiorespiratory fitness to achieve a reduction of symptoms when performing ADLs;Long Term: Be able to perform more ADLs without symptoms or delay the onset of symptoms    Increase knowledge of respiratory medications and ability to use respiratory devices properly  Yes    Intervention Provide education and demonstration as needed of appropriate use of medications, inhalers, and oxygen therapy.    Expected Outcomes Short Term: Achieves understanding of medications use. Understands that oxygen is a medication prescribed by physician. Demonstrates appropriate use of inhaler and oxygen therapy.;Long Term: Maintain appropriate use of medications, inhalers, and oxygen therapy.    Diabetes Yes    Intervention Provide education about signs/symptoms and action to take for hypo/hyperglycemia.;Provide education about proper nutrition, including hydration, and aerobic/resistive exercise prescription along with prescribed medications to achieve blood glucose in normal ranges: Fasting glucose 65-99 mg/dL    Expected Outcomes Short Term: Participant verbalizes understanding of the signs/symptoms and immediate care of  hyper/hypoglycemia, proper foot care and importance of medication, aerobic/resistive exercise and nutrition plan for blood glucose control.;Long Term: Attainment of HbA1C < 7%.    Hypertension Yes    Intervention Provide education on lifestyle modifcations including regular physical activity/exercise, weight management, moderate sodium restriction and increased consumption of fresh fruit, vegetables, and low fat dairy, alcohol moderation, and smoking cessation.;Monitor prescription use compliance.    Expected Outcomes Short Term: Continued assessment and intervention until BP is < 140/58mm HG in hypertensive participants. < 130/36mm HG in hypertensive participants with diabetes, heart failure or chronic kidney disease.;Long Term: Maintenance of blood pressure at goal levels.    Lipids Yes    Intervention Provide education and support for participant on nutrition & aerobic/resistive exercise along with prescribed medications to achieve LDL 70mg , HDL >40mg .    Expected Outcomes Short Term: Participant states understanding of desired cholesterol values and is compliant with medications prescribed. Participant is following exercise prescription and nutrition guidelines.;Long Term: Cholesterol controlled with medications as prescribed, with individualized exercise RX and with personalized nutrition plan. Value goals: LDL <  70mg , HDL > 40 mg.             Education:Diabetes - Individual verbal and written instruction to review signs/symptoms of diabetes, desired ranges of glucose level fasting, after meals and with exercise. Acknowledge that pre and post exercise glucose checks will be done for 3 sessions at entry of program. Flowsheet Row Pulmonary Rehab from 04/17/2021 in [facility] Cardiac and Pulmonary Rehab  Date 02/03/21  Educator [facility]  Instruction Review Code 1- Verbalizes Understanding       Know Your Numbers and Heart Failure: - Group verbal and visual instruction to discuss disease risk factors  for cardiac and pulmonary disease and treatment options.  Reviews associated critical values for Overweight/Obesity, Hypertension, Cholesterol, and Diabetes.  Discusses basics of heart failure: signs/symptoms and treatments.  Introduces Heart Failure Zone chart for action plan for heart failure.  Written material given at graduation.   Core Components/Risk Factors/Patient Goals Review:   Goals and Risk Factor Review     Row Name 02/03/21 1326 03/04/21 0729 04/01/21 0740 04/29/21 0737       Core Components/Risk Factors/Patient Goals Review   Personal Goals Review Tobacco Cessation Tobacco Cessation;Weight Management/Obesity;Increase knowledge of respiratory medications and ability to use respiratory devices properly.;Hypertension;Diabetes;Improve shortness of breath with ADL's Tobacco Cessation;Weight Management/Obesity;Hypertension;Diabetes;Improve shortness of breath with ADL's Tobacco Cessation;Weight Management/Obesity;Hypertension;Diabetes;Improve shortness of breath with ADL's;Increase knowledge of respiratory medications and ability to use respiratory devices properly.    Review Robert Combs is a current tobacco user. Intervention for tobacco cessation was provided at the initial medical review. He was asked about readiness to quit and reported that he is considering it and has cut back already . Patient was advised and educated about tobacco cessation using combination therapy, tobacco cessation classes, quit line, and quit smoking apps. Patient demonstrated understanding of this material but declined hand outs at this time. Staff will continue to provide encouragement and follow up with the patient throughout the program. Robert Combs is doing well in rehab.  He is still smoking a pack a day and not leaving the house on the weekends.  He was encouraged to call his doctor about the depression. He is not really intersted in quitting yet.  His pressures are still all over the place and he only checks them  occassionally at home.  We talked about getting into habit of checking it at home to track it better.  He has not been checking his sugars as he does not feel like doing anything at home.  He is doing well with his meds. His chest has been feeling tight which does not help his breathing. BP 122/60 today - he checks his bp at home - he does not recall how it usually runs, but last night bp 156/70. He is taking his medications as prescribed and he is having no issues. He does not check his BG at home and is not interested in doing so. He practices PLB and feels that it helps, but his overall breathing has stayed the same. He is interested in quitting smoking, but does not have a quit date and would like to think about one. He reports he has lost weight since starting. Robert Combs's weight has been holding steady.  His pressures are doing well and he is still checking them.  His sugars are doing well.  He is only eating one meal still and not exercising so we talked about how exercise can help with weightloss too.  He is doing better with his breathing and has  no problems with his meds.  He continues to smoke but is down to 1-2 a day    Expected Outcomes Short: Continue to reduce number of cigarettes per day Long: Set a quit date Short: Call doctor about depression and check sugars and pressures LOng: Continue to work on a routine to help with depression. Short: Continue to check BP at home, find his BG monitor, decide on a smoking quit date Long: quit smoking short: More protein Long: Conitnue to focus sleep LOng: Continue to work toward quitting.             Core Components/Risk Factors/Patient Goals at Discharge (Final Review):   Goals and Risk Factor Review - 04/29/21 0737       Core Components/Risk Factors/Patient Goals Review   Personal Goals Review Tobacco Cessation;Weight Management/Obesity;Hypertension;Diabetes;Improve shortness of breath with ADL's;Increase knowledge of respiratory medications and  ability to use respiratory devices properly.    Review Robert Combs's weight has been holding steady.  His pressures are doing well and he is still checking them.  His sugars are doing well.  He is only eating one meal still and not exercising so we talked about how exercise can help with weightloss too.  He is doing better with his breathing and has no problems with his meds.  He continues to smoke but is down to 1-2 a day    Expected Outcomes short: More protein Long: Conitnue to focus sleep LOng: Continue to work toward quitting.             ITP Comments:  ITP Comments     Row Name 01/24/21 1610 02/03/21 1152 02/04/21 0717 02/05/21 1022 02/26/21 1154   ITP Comments Virtual orientation call completed today. he has an appointment on Date: 02/03/2021  for EP eval and gym Orientation.  Documentation of diagnosis can be found in Media Tab Date: 02/03/2021 adding [facility] notees to Media Tab . Completed and gym orientation. Initial ITP created and sent for review to Dr. Jinny Sanders, Medical Director. First full day of exercise!  Patient was oriented to gym and equipment including functions, settings, policies, and procedures.  Patient's individual exercise prescription and treatment plan were reviewed.  All starting workloads were established based on the results of the 6 minute walk test done at initial orientation visit.  The plan for exercise progression was also introduced and progression will be customized based on patient's performance and goals. Completed initial RD consultation 30 Day review completed. Medical Director ITP review done, changes made as directed, and signed approval by Medical Director.    Row Name 03/26/21 0820 04/23/21 0753 05/21/21 1421       ITP Comments 30 Day review completed. Medical Director ITP review done, changes made as directed, and signed approval by Medical Director. 30 Day review completed. Medical Director ITP review done, changes made as directed, and signed  approval by Medical Director. 30 day review completed. ITP sent to Dr. Jinny Sanders, Medical Director of  Pulmonary Rehab. Continue with ITP unless changes are made by physician.              Comments: 30 day review

## 2021-05-22 ENCOUNTER — Other Ambulatory Visit: Payer: Self-pay

## 2021-05-22 DIAGNOSIS — J449 Chronic obstructive pulmonary disease, unspecified: Secondary | ICD-10-CM | POA: Diagnosis not present

## 2021-05-22 NOTE — Progress Notes (Signed)
Daily Session Note  Patient Details  Name: Robert Combs MRN: [id number] Date of Birth: November 10, 1945 Referring Provider:   [name] Pulmonary Rehab from 02/03/2021 in [facility] Cardiac and Pulmonary Rehab  Referring Provider Danton Sewer MD  [facility]       Encounter Date: 05/22/2021  Check In:  Session Check In - 05/22/21 0715       Check-In   Supervising physician immediately available to respond to emergencies See telemetry face sheet for immediately available ER MD    Location [facility]-Cardiac & Pulmonary Rehab    Staff Present Birdie Sons, MPA, RN;Jessica Luan Pulling, MA, RCEP, CCRP, CCET;Melissa St. Clair, RDN, Rowe Pavy, BA, ACSM CEP, Exercise Physiologist    Virtual Visit No    Medication changes reported     No    Fall or balance concerns reported    No    Tobacco Cessation No Change    Warm-up and Cool-down Performed on first and last piece of equipment    Resistance Training Performed Yes    VAD Patient? No    PAD/SET Patient? No      Pain Assessment   Currently in Pain? No/denies                Social History   Tobacco Use  Smoking Status Every Day   Packs/day: 1.00   Years: 25.00   Pack years: 25.00   Types: Cigarettes   Last attempt to quit: 07/11/2020   Years since quitting: 0.8  Smokeless Tobacco Never  Tobacco Comments   02/03/21 currently at 1 ppd, [facility] is [facility]    Goals Met:  Independence with exercise equipment Exercise tolerated well No report of concerns or symptoms today Strength training completed today  Goals Unmet:  Not Applicable  Comments: Pt able to follow exercise prescription today without complaint.  Will continue to monitor for progression.    Dr. Emily Filbert is Medical Director for [place].  Dr. Ottie Glazier is Medical Director for [facility] Pulmonary Rehabilitation.

## 2021-06-03 ENCOUNTER — Other Ambulatory Visit: Payer: Self-pay

## 2021-06-03 DIAGNOSIS — J449 Chronic obstructive pulmonary disease, unspecified: Secondary | ICD-10-CM

## 2021-06-03 NOTE — Progress Notes (Signed)
Daily Session Note  Patient Details  Name: Robert Combs MRN: [id number] Date of Birth: 1946/07/23 Referring Provider:   [name] Pulmonary Rehab from 02/03/2021 in [facility] Cardiac and Pulmonary Rehab  Referring Provider Danton Sewer MD  [facility]       Encounter Date: 06/03/2021  Check In:  Session Check In - 06/03/21 0718       Check-In   Supervising physician immediately available to respond to emergencies See telemetry face sheet for immediately available ER MD    Location [facility]-Cardiac & Pulmonary Rehab    Staff Present Birdie Sons, MPA, RN;Jessica Luan Pulling, MA, RCEP, CCRP, CCET    Virtual Visit No    Medication changes reported     No    Fall or balance concerns reported    No    Tobacco Cessation Use Increase    Current number of cigarettes/nicotine per day     10    Warm-up and Cool-down Performed on first and last piece of equipment    Resistance Training Performed Yes    VAD Patient? No    PAD/SET Patient? No      Pain Assessment   Currently in Pain? No/denies                Social History   Tobacco Use  Smoking Status Every Day   Packs/day: 1.00   Years: 25.00   Pack years: 25.00   Types: Cigarettes   Last attempt to quit: 07/11/2020   Years since quitting: 0.8  Smokeless Tobacco Never  Tobacco Comments   02/03/21 currently at 1 ppd, [facility] and [facility] is [facility] it    Goals Met:  Independence with exercise equipment Exercise tolerated well No report of concerns or symptoms today Strength training completed today  Goals Unmet:  Not Applicable  Comments: Pt able to follow exercise prescription today without complaint.  Will continue to monitor for progression.    Dr. Emily Filbert is Medical Director for [place].  Dr. Ottie Glazier is Medical Director for [facility] Pulmonary Rehabilitation.

## 2021-06-10 ENCOUNTER — Ambulatory Visit: Payer: Non-veteran care

## 2021-06-12 ENCOUNTER — Ambulatory Visit: Payer: Non-veteran care

## 2021-06-17 ENCOUNTER — Other Ambulatory Visit: Payer: Self-pay

## 2021-06-17 ENCOUNTER — Ambulatory Visit: Payer: Non-veteran care

## 2021-06-17 ENCOUNTER — Encounter: Payer: No Typology Code available for payment source | Attending: Pulmonary Disease

## 2021-06-17 DIAGNOSIS — J449 Chronic obstructive pulmonary disease, unspecified: Secondary | ICD-10-CM | POA: Insufficient documentation

## 2021-06-17 NOTE — Progress Notes (Signed)
Daily Session Note  Patient Details  Name: Robert Combs MRN: [id number] Date of Birth: Jan 17, 1946 Referring Provider:   [name] Pulmonary Rehab from 02/03/2021 in [facility] Cardiac and Pulmonary Rehab  Referring Provider Danton Sewer MD  [facility]       Encounter Date: 06/17/2021  Check In:  Session Check In - 06/17/21 0717       Check-In   Supervising physician immediately available to respond to emergencies See telemetry face sheet for immediately available ER MD    Location [facility]-Cardiac & Pulmonary Rehab    Staff Present Birdie Sons, MPA, RN;Jessica Luan Pulling, MA, RCEP, CCRP, CCET;Amanda Sommer, BA, ACSM CEP, Exercise Physiologist    Virtual Visit No    Medication changes reported     No    Fall or balance concerns reported    No    Tobacco Cessation No Change    Warm-up and Cool-down Performed on first and last piece of equipment    Resistance Training Performed Yes    VAD Patient? No    PAD/SET Patient? No      Pain Assessment   Currently in Pain? No/denies                Social History   Tobacco Use  Smoking Status Every Day   Packs/day: 1.00   Years: 25.00   Pack years: 25.00   Types: Cigarettes   Last attempt to quit: 07/11/2020   Years since quitting: 0.9  Smokeless Tobacco Never  Tobacco Comments   02/03/21 currently at 1 ppd, [facility] and he is considering it    Goals Met:  Independence with exercise equipment Exercise tolerated well No report of concerns or symptoms today Strength training completed today  Goals Unmet:  Not Applicable  Comments: Pt able to follow exercise prescription today without complaint.  Will continue to monitor for progression.    Dr. Emily Filbert is Medical Director for [place].  Dr. Ottie Glazier is Medical Director for [facility] Pulmonary Rehabilitation.

## 2021-06-18 ENCOUNTER — Encounter: Payer: Self-pay | Admitting: *Deleted

## 2021-06-18 DIAGNOSIS — J449 Chronic obstructive pulmonary disease, unspecified: Secondary | ICD-10-CM

## 2021-06-18 NOTE — Progress Notes (Signed)
Pulmonary Individual Treatment Plan  Patient Details  Name: LUVERN MCISAAC MRN: [id number] Date of Birth: 05/28/1946 Referring Provider:   Doristine Devoid Pulmonary Rehab from 02/03/2021 in [facility] Cardiac and Pulmonary Rehab  Referring Provider Marcelyn Bruins MD  [facility]       Initial Encounter Date:  Flowsheet Row Pulmonary Rehab from 02/03/2021 in [facility] Cardiac and Pulmonary Rehab  Date 02/03/21       Visit Diagnosis: Chronic obstructive pulmonary disease, unspecified COPD type (HCC)  Patient's Home Medications on Admission:  Current Outpatient Medications:    albuterol (PROVENTIL HFA;VENTOLIN HFA) 108 (90 Base) MCG/ACT inhaler, Inhale 1 puff into the lungs every 6 (six) hours as needed for wheezing or shortness of breath., Disp: , Rfl:    albuterol (VENTOLIN HFA) 108 (90 Base) MCG/ACT inhaler, Inhale into the lungs., Disp: , Rfl:    Alogliptin Benzoate 25 MG TABS, Take by mouth., Disp: , Rfl:    Ascorbic Acid (VITAMIN C) 100 MG tablet, Take 100 mg by mouth daily. (Patient not taking: Reported on 01/24/2021), Disp: , Rfl:    aspirin EC 81 MG tablet, Take 81 mg by mouth daily.  , Disp: , Rfl:    benzonatate (TESSALON PERLES) 100 MG capsule, Take 1 capsule (100 mg total) by mouth every 6 (six) hours as needed for cough. (Patient not taking: Reported on 01/24/2021), Disp: 30 capsule, Rfl: 0   budesonide-formoterol (SYMBICORT) 160-4.5 MCG/ACT inhaler, Inhale into the lungs., Disp: , Rfl:    cholecalciferol (VITAMIN D) 1000 UNITS tablet, Take 1,000 Units by mouth daily.  , Disp: , Rfl:    losartan (COZAAR) 50 MG tablet, Take 25 mg by mouth daily., Disp: , Rfl:    metFORMIN (GLUCOPHAGE) 500 MG tablet, Take 1 tablet (500 mg) PO daily x 1 week, then increase to 1 tablet PO BID thereafter, Disp: 60 tablet, Rfl: 0   mometasone (ASMANEX) 220 MCG/INH inhaler, Take by mouth., Disp: , Rfl:    omeprazole (PRILOSEC OTC) 20 MG tablet, Take 20 mg by mouth daily., Disp: , Rfl:    traMADol (ULTRAM)  50 MG tablet, Take 1 tablet (50 mg total) by mouth every 6 (six) hours as needed., Disp: 10 tablet, Rfl: 0  Past Medical History: Past Medical History:  Diagnosis Date   COPD (chronic obstructive pulmonary disease) (HCC)    Coronary artery disease    DDD (degenerative disc disease)    Hypertension     Tobacco Use: Social History   Tobacco Use  Smoking Status Every Day   Packs/day: 1.00   Years: 25.00   Pack years: 25.00   Types: Cigarettes   Last attempt to quit: 07/11/2020   Years since quitting: 0.9  Smokeless Tobacco Never  Tobacco Comments   02/03/21 currently at 1 ppd, [facility]    Labs: Recent Review Flowsheet Data     Labs for ITP Cardiac and Pulmonary Rehab Latest Ref Rng & Units 02/03/2010 11/04/2015   HCO3 20.0 - 24.0 mEq/L - 26.3(H)   TCO2 0 - 100 mmol/L 31 28   O2SAT % - 75.0        Pulmonary Assessment Scores:  Pulmonary Assessment Scores     Row Name 02/03/21 1328         ADL UCSD   ADL Phase Entry     SOB Score total 57     Rest 3     Walk 4     Stairs 5  Bath 0     Dress 0     Shop 2       CAT Score   CAT Score 37       mMRC Score   mMRC Score 0              UCSD: Self-administered rating of dyspnea associated with activities of daily living (ADLs) 6-point scale (0 = "not at all" to 5 = "maximal or unable to do because of breathlessness")  Scoring Scores range from 0 to 120.  Minimally important difference is 5 units  CAT: CAT can identify the health impairment of COPD patients and is better correlated with disease progression.  CAT has a scoring range of zero to 40. The CAT score is classified into four groups of low (less than 10), medium (10 - 20), high (21-30) and very high (31-40) based on the impact level of disease on health status. A CAT score over 10 suggests significant symptoms.  A worsening CAT score could be explained by an exacerbation, poor medication adherence, poor inhaler  technique, or progression of COPD or comorbid conditions.  CAT MCID is 2 points  mMRC: mMRC (Modified Medical Research Council) Dyspnea Scale is used to assess the degree of baseline functional disability in patients of respiratory disease due to dyspnea. No minimal important difference is established. A decrease in score of 1 point or greater is considered a positive change.   Pulmonary Function Assessment:  Pulmonary Function Assessment - 02/03/21 1327       Breath   Shortness of Breath Yes;Fear of Shortness of Breath;Limiting activity             Exercise Target Goals: Exercise Program Goal: Individual exercise prescription set using results from initial 6 min walk test and THRR while considering  patient's activity barriers and safety.   Exercise Prescription Goal: Initial exercise prescription builds to 30-45 minutes a day of aerobic activity, 2-3 days per week.  Home exercise guidelines will be given to patient during program as part of exercise prescription that the participant will acknowledge.  Education: Aerobic Exercise: - Group verbal and visual presentation on the components of exercise prescription. Introduces F.I.T.T principle from ACSM for exercise prescriptions.  Reviews F.I.T.T. principles of aerobic exercise including progression. Written material given at graduation. Flowsheet Row Pulmonary Rehab from 04/17/2021 in [facility] Cardiac and Pulmonary Rehab  Date 02/06/21  Educator [facility]  Instruction Review Code 1- Verbalizes Understanding       Education: Resistance Exercise: - Group verbal and visual presentation on the components of exercise prescription. Introduces F.I.T.T principle from ACSM for exercise prescriptions  Reviews F.I.T.T. principles of resistance exercise including progression. Written material given at graduation. Flowsheet Row Pulmonary Rehab from 04/17/2021 in [facility] Cardiac and Pulmonary Rehab  Date 04/17/21  Educator [facility]  Instruction Review Code 1-  Verbalizes Understanding        Education: Exercise & Equipment Safety: - Individual verbal instruction and demonstration of equipment use and safety with use of the equipment. Flowsheet Row Pulmonary Rehab from 04/17/2021 in [facility] Cardiac and Pulmonary Rehab  Date 02/03/21  Educator [facility]  Instruction Review Code 1- Verbalizes Understanding       Education: Exercise Physiology & General Exercise Guidelines: - Group verbal and written instruction with models to review the exercise physiology of the cardiovascular system and associated critical values. Provides general exercise guidelines with specific guidelines to those with heart or lung disease.    Education: Flexibility, Balance, Mind/Body Relaxation: - Group  verbal and visual presentation with interactive activity on the components of exercise prescription. Introduces F.I.T.T principle from ACSM for exercise prescriptions. Reviews F.I.T.T. principles of flexibility and balance exercise training including progression. Also discusses the mind body connection.  Reviews various relaxation techniques to help reduce and manage stress (i.e. Deep breathing, progressive muscle relaxation, and visualization). Balance handout provided to take home. Written material given at graduation. Flowsheet Row Pulmonary Rehab from 04/17/2021 in [facility] Cardiac and Pulmonary Rehab  Date 02/20/21  Educator [facility]  Instruction Review Code 1- Verbalizes Understanding       Activity Barriers & Risk Stratification:  Activity Barriers & Cardiac Risk Stratification - 02/03/21 1154       Activity Barriers & Cardiac Risk Stratification   Activity Barriers Shortness of Breath;Joint Problems;Deconditioning;Muscular Weakness;Balance Concerns             6 Minute Walk:  6 Minute Walk     Row Name 02/03/21 1152         6 Minute Walk   Phase Initial     Distance 1380 feet     Walk Time 6 minutes     # of Rest Breaks 0     MPH 2.61     METS 2.72     RPE 11      Perceived Dyspnea  3     VO2 Peak 9.51     Symptoms Yes (comment)     Comments legs burning, SOB     Resting HR 60 bpm     Resting BP 122/62     Resting Oxygen Saturation  94 %     Exercise Oxygen Saturation  during 6 min walk 91 %     Max Ex. HR 83 bpm     Max Ex. BP 156/74     2 Minute Post BP 132/70       Interval HR   1 Minute HR 59     2 Minute HR 66     3 Minute HR 67     4 Minute HR 82     5 Minute HR 83     6 Minute HR 70     2 Minute Post HR 65     Interval Heart Rate? Yes       Interval Oxygen   Interval Oxygen? Yes     Baseline Oxygen Saturation % 94 %     1 Minute Oxygen Saturation % 93 %     1 Minute Liters of Oxygen 0 L  Room Air     2 Minute Oxygen Saturation % 92 %     2 Minute Liters of Oxygen 0 L     3 Minute Oxygen Saturation % 91 %     3 Minute Liters of Oxygen 0 L     4 Minute Oxygen Saturation % 92 %     4 Minute Liters of Oxygen 0 L     5 Minute Oxygen Saturation % 91 %     5 Minute Liters of Oxygen 0 L     6 Minute Oxygen Saturation % 91 %     6 Minute Liters of Oxygen 0 L     2 Minute Post Oxygen Saturation % 96 %     2 Minute Post Liters of Oxygen 0 L             Oxygen Initial Assessment:  Oxygen Initial Assessment - 02/03/21 1327       Home Oxygen  Home Oxygen Device None    Sleep Oxygen Prescription None    Home Exercise Oxygen Prescription None    Home Resting Oxygen Prescription None      Initial 6 min Walk   Oxygen Used None      Program Oxygen Prescription   Program Oxygen Prescription None      Intervention   Short Term Goals To learn and demonstrate proper pursed lip breathing techniques or other breathing techniques. ;To learn and demonstrate proper use of respiratory medications;To learn and understand importance of maintaining oxygen saturations>88%;To learn and understand importance of monitoring SPO2 with pulse oximeter and demonstrate accurate use of the pulse oximeter.    Long  Term Goals Exhibits proper  breathing techniques, such as pursed lip breathing or other method taught during program session;Compliance with respiratory medication;Maintenance of O2 saturations>88%;Verbalizes importance of monitoring SPO2 with pulse oximeter and return demonstration             Oxygen Re-Evaluation:  Oxygen Re-Evaluation     Row Name 02/04/21 0718 03/04/21 0737 04/01/21 0733 04/29/21 0739 06/03/21 0738     Program Oxygen Prescription   Program Oxygen Prescription None None None None None     Home Oxygen   Home Oxygen Device None None None None None   Sleep Oxygen Prescription None None None None None   Home Exercise Oxygen Prescription None None None None None   Home Resting Oxygen Prescription None None None None None   Compliance with Home Oxygen Use Yes -- Yes -- Yes     Goals/Expected Outcomes   Short Term Goals To learn and demonstrate proper pursed lip breathing techniques or other breathing techniques. ;To learn and understand importance of maintaining oxygen saturations>88%;To learn and understand importance of monitoring SPO2 with pulse oximeter and demonstrate accurate use of the pulse oximeter. To learn and demonstrate proper pursed lip breathing techniques or other breathing techniques. ;To learn and understand importance of maintaining oxygen saturations>88%;To learn and understand importance of monitoring SPO2 with pulse oximeter and demonstrate accurate use of the pulse oximeter. To learn and demonstrate proper pursed lip breathing techniques or other breathing techniques. ;To learn and understand importance of maintaining oxygen saturations>88%;To learn and understand importance of monitoring SPO2 with pulse oximeter and demonstrate accurate use of the pulse oximeter. To learn and understand importance of monitoring SPO2 with pulse oximeter and demonstrate accurate use of the pulse oximeter.;To learn and understand importance of maintaining oxygen saturations>88%;To learn and demonstrate  proper pursed lip breathing techniques or other breathing techniques. ;To learn and demonstrate proper use of respiratory medications To learn and understand importance of monitoring SPO2 with pulse oximeter and demonstrate accurate use of the pulse oximeter.;To learn and understand importance of maintaining oxygen saturations>88%;To learn and demonstrate proper pursed lip breathing techniques or other breathing techniques. ;To learn and demonstrate proper use of respiratory medications   Long  Term Goals Exhibits proper breathing techniques, such as pursed lip breathing or other method taught during program session;Compliance with respiratory medication;Maintenance of O2 saturations>88%;Verbalizes importance of monitoring SPO2 with pulse oximeter and return demonstration Exhibits proper breathing techniques, such as pursed lip breathing or other method taught during program session;Compliance with respiratory medication;Maintenance of O2 saturations>88%;Verbalizes importance of monitoring SPO2 with pulse oximeter and return demonstration Exhibits proper breathing techniques, such as pursed lip breathing or other method taught during program session;Compliance with respiratory medication;Maintenance of O2 saturations>88%;Verbalizes importance of monitoring SPO2 with pulse oximeter and return demonstration Verbalizes importance of monitoring SPO2 with  pulse oximeter and return demonstration;Maintenance of O2 saturations>88%;Exhibits proper breathing techniques, such as pursed lip breathing or other method taught during program session;Compliance with respiratory medication Verbalizes importance of monitoring SPO2 with pulse oximeter and return demonstration;Maintenance of O2 saturations>88%;Exhibits proper breathing techniques, such as pursed lip breathing or other method taught during program session;Compliance with respiratory medication   Comments Reviewed PLB technique with pt.  Talked about how it works and  it's importance in maintaining their exercise saturations. Kaeden is doing well in rehab. His breathing is getting better, but he is stil smoking and his chest feels tight.  We talked about quitting smoking to help. He does use his PLB with exercise. Delron reports his breathing is about the time. He does not have a pulse oximeter at home, discussed importance of checking oxygen. He continues to practice PLB and feels that it helps. He is still smoking, he is smoking the same amount, but is interested in quitting. He has quit 4-5 times in 3 years, when he feels triggered he will start again. Iden is doing well with rehab.  His breathing is getting better and monitors his sats at home.  He uses his PLB when he feels short of breath but not before then. Jovonni is doing well in rehab.  His breathing continues to improve!  He was able to enjoy walking on his vacation last week.  He continues to work on his PLB. He does continue to smoke too.   Goals/Expected Outcomes Short: Become more profiecient at using PLB.   Long: Become independent at using PLB. short: Continue to work on breathing Long: Use PLB more frequently short: Think about a quit date Long: Use PLB more frequently short: Think about a quit date Long: Use PLB more frequently Short: Continue to exercise to improve breathing Long: Continue to use PLB for breath control.            Oxygen Discharge (Final Oxygen Re-Evaluation):  Oxygen Re-Evaluation - 06/03/21 0738       Program Oxygen Prescription   Program Oxygen Prescription None      Home Oxygen   Home Oxygen Device None    Sleep Oxygen Prescription None    Home Exercise Oxygen Prescription None    Home Resting Oxygen Prescription None    Compliance with Home Oxygen Use Yes      Goals/Expected Outcomes   Short Term Goals To learn and understand importance of monitoring SPO2 with pulse oximeter and demonstrate accurate use of the pulse oximeter.;To learn and understand importance of  maintaining oxygen saturations>88%;To learn and demonstrate proper pursed lip breathing techniques or other breathing techniques. ;To learn and demonstrate proper use of respiratory medications    Long  Term Goals Verbalizes importance of monitoring SPO2 with pulse oximeter and return demonstration;Maintenance of O2 saturations>88%;Exhibits proper breathing techniques, such as pursed lip breathing or other method taught during program session;Compliance with respiratory medication    Comments Harrol is doing well in rehab.  His breathing continues to improve!  He was able to enjoy walking on his vacation last week.  He continues to work on his PLB. He does continue to smoke too.    Goals/Expected Outcomes Short: Continue to exercise to improve breathing Long: Continue to use PLB for breath control.             Initial Exercise Prescription:  Initial Exercise Prescription - 02/03/21 1300       Date of Initial Exercise RX and Referring Provider  Date 02/03/21    Referring Provider Danton Sewer MD   [facility]     Treadmill   MPH 2.2    Grade 0.5    Minutes 15    METs 2.84      Recumbant Bike   Level 2    RPM 50    Watts 22    Minutes 15    METs 2.6      Biostep-RELP   Level 2    SPM 50    Minutes 15    METs 2      Track   Laps 30    Minutes 15    METs 2.6      Prescription Details   Frequency (times per week) 2    Duration Progress to 30 minutes of continuous aerobic without signs/symptoms of physical distress      Intensity   THRR 40-80% of Max Heartrate 95-130    Ratings of Perceived Exertion 11-13    Perceived Dyspnea 0-4      Progression   Progression Continue to progress workloads to maintain intensity without signs/symptoms of physical distress.      Resistance Training   Training Prescription Yes    Weight 4 lb    Reps 10-15             Perform Capillary Blood Glucose checks as needed.  Exercise Prescription Changes:   Exercise  Prescription Changes     Row Name 02/03/21 1300 02/18/21 1500 03/05/21 1700 03/18/21 0700 04/01/21 1400     Response to Exercise   Blood Pressure (Admit) 122/62 98/54 110/64 128/64 122/62   Blood Pressure (Exercise) 156/74 120/60 138/66 132/62 --   Blood Pressure (Exit) 110/66 112/60 124/64 112/66 130/82   Heart Rate (Admit) 60 bpm 65 bpm 58 bpm 88 bpm 52 bpm   Heart Rate (Exercise) 83 bpm 94 bpm 96 bpm 93 bpm 88 bpm   Heart Rate (Exit) 62 bpm 61 bpm 66 bpm 80 bpm 73 bpm   Oxygen Saturation (Admit) 94 % 89 % 94 % 94 % 94 %   Oxygen Saturation (Exercise) 91 % 90 % 93 % 91 % 92 %   Oxygen Saturation (Exit) 94 % 95 % 95 % 94 % 93 %   Rating of Perceived Exertion (Exercise) 11 13 13 13 13    Perceived Dyspnea (Exercise) 3 0 2 0 1   Symptoms SOB, legs burning none -- none none   Comments walk test results -- -- -- --   Duration -- Progress to 30 minutes of  aerobic without signs/symptoms of physical distress Continue with 30 min of aerobic exercise without signs/symptoms of physical distress. Continue with 30 min of aerobic exercise without signs/symptoms of physical distress. Continue with 30 min of aerobic exercise without signs/symptoms of physical distress.   Intensity -- THRR unchanged THRR unchanged THRR unchanged THRR unchanged     Progression   Progression -- Continue to progress workloads to maintain intensity without signs/symptoms of physical distress. Continue to progress workloads to maintain intensity without signs/symptoms of physical distress. Continue to progress workloads to maintain intensity without signs/symptoms of physical distress. Continue to progress workloads to maintain intensity without signs/symptoms of physical distress.   Average METs -- 2.4 2.2 3.02 3     Resistance Training   Training Prescription -- Yes Yes Yes Yes   Weight -- 4 lb 5 lb 5 lb 6 lb   Reps -- 10-15 10-15 10-15 10-15     Interval  Training   Interval Training -- No No No No     Treadmill    MPH -- 2 -- 2.5 --   Grade -- 0 -- 0.5 --   Minutes -- 15 -- 15 --   METs -- 2.53 -- 3.09 --     Recumbant Bike   Level -- 2 -- 5 --   Watts -- 19 -- 33 --   Minutes -- 15 -- 15 --   METs -- 2.66 -- 3.1 --     Biostep-RELP   Level -- 2 2 -- 5   Minutes -- 15 15 -- 15   METs -- 2 2 -- 3     Track   Laps -- 27 27 -- 39   Minutes -- 15 15 -- 15   METs -- 2.47 2.47 -- 3.12    Row Name 04/14/21 1500 04/29/21 1300 05/13/21 1200 05/26/21 1500 06/09/21 1200     Response to Exercise   Blood Pressure (Admit) 126/64 110/62 124/62 134/62 110/70   Blood Pressure (Exit) 132/82 110/64 128/58 122/64 112/62   Heart Rate (Admit) 87 bpm 50 bpm 50 bpm 64 bpm 55 bpm   Heart Rate (Exercise) 86 bpm 67 bpm 79 bpm 87 bpm 81 bpm   Heart Rate (Exit) 72 bpm 59 bpm 70 bpm 82 bpm 56 bpm   Oxygen Saturation (Admit) 93 % 94 % 93 % 92 % 93 %   Oxygen Saturation (Exercise) 89 % 90 % 90 % 90 % 90 %   Oxygen Saturation (Exit) 95 % 94 % 94 % 95 % 95 %   Rating of Perceived Exertion (Exercise) 13 12 13 13 13    Perceived Dyspnea (Exercise) 1 0 1 1 1    Symptoms none none none -- none   Duration Continue with 30 min of aerobic exercise without signs/symptoms of physical distress. Continue with 30 min of aerobic exercise without signs/symptoms of physical distress. Continue with 30 min of aerobic exercise without signs/symptoms of physical distress. Continue with 30 min of aerobic exercise without signs/symptoms of physical distress. Continue with 30 min of aerobic exercise without signs/symptoms of physical distress.   Intensity THRR unchanged THRR unchanged THRR unchanged THRR unchanged THRR unchanged     Progression   Progression Continue to progress workloads to maintain intensity without signs/symptoms of physical distress. Continue to progress workloads to maintain intensity without signs/symptoms of physical distress. Continue to progress workloads to maintain intensity without signs/symptoms of physical  distress. Continue to progress workloads to maintain intensity without signs/symptoms of physical distress. Continue to progress workloads to maintain intensity without signs/symptoms of physical distress.   Average METs 2.91 3.25 3.12 2.6 2.6     Resistance Training   Training Prescription Yes Yes Yes Yes Yes   Weight 6 lb 7 lb 7 lb 7 lb 7 lb   Reps 10-15 10-15 10-15 10-15 10-15     Interval Training   Interval Training No No No No No     Treadmill   MPH 2.7 -- 2.5 -- --   Grade 1 -- 1.5 -- --   Minutes 15 -- 15 -- --   METs 3.44 -- 3.23 -- --     Recumbant Bike   Level 3 -- 5 -- 5   Watts 25 -- 27 -- 22   Minutes 15 -- 15 -- 15   METs 3.79 -- 2.83 -- 2.71     NuStep   Level -- -- -- -- 5  Minutes -- -- -- -- 15   METs -- -- -- -- 2.5     Biostep-RELP   Level $Remo'5 5 5 5 'fECNZ$ --   Minutes $Remove'15 15 15 15 'QVFKsFZ$ --   METs $Rem'2 3 3 2 'fARU$ --     Track   Laps 44 46 41 20 --   Minutes $Remove'15 15 15 15 'migdDCV$ --   METs 3.39 3.5 3.23 -- --     Home Exercise Plan   Plans to continue exercise at Home (comment)  walking Home (comment)  walking Home (comment)  walking Home (comment)  walking Home (comment)  walking   Frequency Add 2 additional days to program exercise sessions. Add 2 additional days to program exercise sessions. Add 2 additional days to program exercise sessions. Add 2 additional days to program exercise sessions. Add 2 additional days to program exercise sessions.   Initial Home Exercises Provided 03/04/21 03/04/21 03/04/21 03/04/21 03/04/21     Oxygen   Maintain Oxygen Saturation -- -- 88% or higher -- 88% or higher            Exercise Comments:   Exercise Comments     Row Name 02/04/21 0717           Exercise Comments First full day of exercise!  Patient was oriented to gym and equipment including functions, settings, policies, and procedures.  Patient's individual exercise prescription and treatment plan were reviewed.  All starting workloads were established based on the results of  the 6 minute walk test done at initial orientation visit.  The plan for exercise progression was also introduced and progression will be customized based on patient's performance and goals.                Exercise Goals and Review:   Exercise Goals     Row Name 02/03/21 1319             Exercise Goals   Increase Physical Activity Yes       Intervention Provide advice, education, support and counseling about physical activity/exercise needs.;Develop an individualized exercise prescription for aerobic and resistive training based on initial evaluation findings, risk stratification, comorbidities and participant's personal goals.       Expected Outcomes Short Term: Attend rehab on a regular basis to increase amount of physical activity.;Long Term: Add in home exercise to make exercise part of routine and to increase amount of physical activity.;Long Term: Exercising regularly at least 3-5 days a week.       Increase Strength and Stamina Yes       Intervention Provide advice, education, support and counseling about physical activity/exercise needs.;Develop an individualized exercise prescription for aerobic and resistive training based on initial evaluation findings, risk stratification, comorbidities and participant's personal goals.       Expected Outcomes Short Term: Increase workloads from initial exercise prescription for resistance, speed, and METs.;Short Term: Perform resistance training exercises routinely during rehab and add in resistance training at home;Long Term: Improve cardiorespiratory fitness, muscular endurance and strength as measured by increased METs and functional capacity (6MWT)       Able to understand and use rate of perceived exertion (RPE) scale Yes       Intervention Provide education and explanation on how to use RPE scale       Expected Outcomes Short Term: Able to use RPE daily in rehab to express subjective intensity level;Long Term:  Able to use RPE to guide  intensity level when exercising independently  Able to understand and use Dyspnea scale Yes       Intervention Provide education and explanation on how to use Dyspnea scale       Expected Outcomes Short Term: Able to use Dyspnea scale daily in rehab to express subjective sense of shortness of breath during exertion;Long Term: Able to use Dyspnea scale to guide intensity level when exercising independently       Knowledge and understanding of Target Heart Rate Range (THRR) Yes       Intervention Provide education and explanation of THRR including how the numbers were predicted and where they are located for reference       Expected Outcomes Short Term: Able to use daily as guideline for intensity in rehab;Short Term: Able to state/look up THRR;Long Term: Able to use THRR to govern intensity when exercising independently       Able to check pulse independently Yes       Intervention Provide education and demonstration on how to check pulse in carotid and radial arteries.;Review the importance of being able to check your own pulse for safety during independent exercise       Expected Outcomes Short Term: Able to explain why pulse checking is important during independent exercise;Long Term: Able to check pulse independently and accurately       Understanding of Exercise Prescription Yes       Intervention Provide education, explanation, and written materials on patient's individual exercise prescription       Expected Outcomes Short Term: Able to explain program exercise prescription;Long Term: Able to explain home exercise prescription to exercise independently                Exercise Goals Re-Evaluation :  Exercise Goals Re-Evaluation     Row Name 02/04/21 0717 02/18/21 1509 03/04/21 0746 03/18/21 0749 04/01/21 0732     Exercise Goal Re-Evaluation   Exercise Goals Review Knowledge and understanding of Target Heart Rate Range (THRR);Increase Physical Activity;Able to understand and use  rate of perceived exertion (RPE) scale;Understanding of Exercise Prescription;Able to understand and use Dyspnea scale;Able to check pulse independently;Increase Strength and Stamina Increase Physical Activity;Increase Strength and Stamina;Understanding of Exercise Prescription Increase Physical Activity;Increase Strength and Stamina;Understanding of Exercise Prescription;Able to understand and use rate of perceived exertion (RPE) scale;Able to understand and use Dyspnea scale;Knowledge and understanding of Target Heart Rate Range (THRR);Able to check pulse independently Increase Physical Activity;Increase Strength and Stamina;Understanding of Exercise Prescription Increase Physical Activity;Increase Strength and Stamina;Understanding of Exercise Prescription   Comments Reviewed RPE and dyspnea scales, THR and program prescription with pt today.  Pt voiced understanding and was given a copy of goals to take home. Fabricio is off to a good start in rehab.  He has only completed his first 4 full days of exercise thus far.  He is at 2.0 mph on the treadmill.  We will continue to monitor his progress and encourage good attendance. Karell is doing well in rehab.  He is have some symptoms of depression and we talked about exercising can be helpful.  Reviewed home exercise with pt today.  Pt plans to walk at home and use staff videos for exercise.  Reviewed THR, pulse, RPE, sign and symptoms, pulse oximetery and when to call 911 or MD.  Also discussed weather considerations and indoor options.  Pt voiced understanding. Trevyon does not have the most consistent attendance due to his work that takes him out of town frequently.  He does do well in rehab  when present.  He is up to level 5 on the recumbent bike.  We continue to monitor his progress. Dovber reports walking a thome 30 minutes on the days he does not come here (RPE 12). He reports he is not as consistent as he would like to be. Discussed adding warm up and cool down  and monitoring O2.   Expected Outcomes Short: Use RPE daily to regulate intensity. Long: Follow program prescription in THR. Short: Attend rehab regularly Long: Continue to improve stamina. Short: get into routine with exercise Long: Continue to improve stamina Short: walk when out of town to maintain Long: Conitnue to improve stamina. Short: walk when not at rehab consistently Long: Conitnue to improve stamina.    [place] Name 04/14/21 1456 04/29/21 0732 04/29/21 1316 05/13/21 1229 05/26/21 1602     Exercise Goal Re-Evaluation   Exercise Goals Review Increase Physical Activity;Increase Strength and Stamina;Understanding of Exercise Prescription Increase Physical Activity;Increase Strength and Stamina;Understanding of Exercise Prescription Increase Physical Activity;Increase Strength and Stamina Increase Physical Activity;Increase Strength and Stamina;Understanding of Exercise Prescription --   Comments Nikolay is doing well in rehab. He is up to 44 laps on the track which is over a mile!  We will continue to monitor his progress. Doyal is doing well in rehab. The routine of coming to class helps.  He does yard work once a week to keep active.  We talked about trying to exericse more for the mental boost. -- Tymarion is doing well in rehab.  He enjoys coming to class but is not as active when not here still.  He is up to 41 laps today!  We will continue to monitor his progress. Tandre continues to do well.  He uses 7 lb for strength work.  Staff will review importance of home exercise.   Expected Outcomes Short: Continue to walk more Long: Continue to improve stamina Short: More exericse on off days Long: Continue to improve stamina. -- Short: Try to add in more exercise Long: Continue to improve stmaina. Short: get 1-2 days of exercise outside program sessions Long:build overall stamina    Row Name 06/03/21 0720 06/09/21 1220           Exercise Goal Re-Evaluation   Exercise Goals Review Increase Physical  Activity;Increase Strength and Stamina;Understanding of Exercise Prescription Increase Physical Activity;Increase Strength and Stamina      Comments Yandriel is doing well in rehab.  He is still not doing his home exercise. He was out vacation last week in [place]. He was able to walk some more than his normal up there.   He is feeling better and stronger than when he started. Azazel is doing well in rehab. He tolerated the Nustep T4 well for the first time doing it. His oxygen saturations are staying above 88%. RPEs are in appropriate ranges. Will continue to monitor.      Expected Outcomes Short: Get in more exercise in at home.  Long: Continue build stamina. Short: Continue to build up laps on the track Long: Continue to increase overall MET level and strength               Discharge Exercise Prescription (Final Exercise Prescription Changes):  Exercise Prescription Changes - 06/09/21 1200       Response to Exercise   Blood Pressure (Admit) 110/70    Blood Pressure (Exit) 112/62    Heart Rate (Admit) 55 bpm    Heart Rate (Exercise) 81 bpm    Heart Rate (Exit)  56 bpm    Oxygen Saturation (Admit) 93 %    Oxygen Saturation (Exercise) 90 %    Oxygen Saturation (Exit) 95 %    Rating of Perceived Exertion (Exercise) 13    Perceived Dyspnea (Exercise) 1    Symptoms none    Duration Continue with 30 min of aerobic exercise without signs/symptoms of physical distress.    Intensity THRR unchanged      Progression   Progression Continue to progress workloads to maintain intensity without signs/symptoms of physical distress.    Average METs 2.6      Resistance Training   Training Prescription Yes    Weight 7 lb    Reps 10-15      Interval Training   Interval Training No      Recumbant Bike   Level 5    Watts 22    Minutes 15    METs 2.71      NuStep   Level 5    Minutes 15    METs 2.5      Home Exercise Plan   Plans to continue exercise at Home (comment)   walking    Frequency Add 2 additional days to program exercise sessions.    Initial Home Exercises Provided 03/04/21      Oxygen   Maintain Oxygen Saturation 88% or higher             Nutrition:  Target Goals: Understanding of nutrition guidelines, daily intake of sodium '1500mg'$ , cholesterol '200mg'$ , calories 30% from fat and 7% or less from saturated fats, daily to have 5 or more servings of fruits and vegetables.  Education: All About Nutrition: -Group instruction provided by verbal, written material, interactive activities, discussions, models, and posters to present general guidelines for heart healthy nutrition including fat, fiber, MyPlate, the role of sodium in heart healthy nutrition, utilization of the nutrition label, and utilization of this knowledge for meal planning. Follow up email sent as well. Written material given at graduation.   Biometrics:  Pre Biometrics - 02/03/21 1320       Pre Biometrics   Height 5' 10.1" (1.781 m)    Weight 213 lb 9.6 oz (96.9 kg)    BMI (Calculated) 30.55    Single Leg Stand 6.5 seconds              Nutrition Therapy Plan and Nutrition Goals:  Nutrition Therapy & Goals - 02/05/21 0948       Nutrition Therapy   Diet Heart healthy, low Na, diabetes friendly    Protein (specify units) 110-115g    Fiber 30 grams    Whole Grain Foods 3 servings    Saturated Fats 12 max. grams    Fruits and Vegetables 8 servings/day    Sodium 1.5 grams      Personal Nutrition Goals   Nutrition Goal ST: add mid morning snakc to avoid probable low BG and intense hunger, check BG at least 1x/day. LT: maintain BG during the day, choose healthier options that follow MyPlate stucture when eating out for lunch.    Comments He doesn't normally check his BG at home. His MD said his BG was good when he was last in per [place]. He reports no shortness of breath during or after eating. He reports that his appetite is lower than used to be. 221lb--> 214lbs in 3 weeks and  feels the difference in his clothes. He feels his appetite was the same over this time. B: weekend: bacon and egg  sandwich (1/2) L: go out: vegetables and meat or sandwiches (hamburger, hot dog, bolonga sandwich) D: cabbage and green beans, baked chicken, pork chop, corn, potatoes -baked, spaghetti with meat sauce and salad (not fond of pasta), he does not like rice, uses butter and olive oil, he reports not liking salt so she will use a little bit of salt - girlfriend cooks. He reports waiting until he gets "extremely hungry" to eat. He feels hunger when his legs get weak or he has stomach pains. He feels it's hard for him to get balanced meal at work when he goes out for lunch. We will start with a mid morning snack to avoid extreme hunger and probable low BG and then move to modifying lunch. Discussed importance of checking BG - he reports having all the tools to check and what to do for low BG. Discussed heart healthy eaitng, diabetes friendly eating, and COPD friendly eating.      Intervention Plan   Intervention Prescribe, educate and counsel regarding individualized specific dietary modifications aiming towards targeted core components such as weight, hypertension, lipid management, diabetes, heart failure and other comorbidities.    Expected Outcomes Short Term Goal: Understand basic principles of dietary content, such as calories, fat, sodium, cholesterol and nutrients.;Short Term Goal: A plan has been developed with personal nutrition goals set during dietitian appointment.;Long Term Goal: Adherence to prescribed nutrition plan.             Nutrition Assessments:  MEDIFICTS Score Key: ?70 Need to make dietary changes  40-70 Heart Healthy Diet ? 40 Therapeutic Level Cholesterol Diet  Flowsheet Row Pulmonary Rehab from 02/03/2021 in [facility]  Picture Your Plate Total Score on Admission 51      Picture Your Plate Scores: <02 Unhealthy dietary pattern with much  room for improvement. 41-50 Dietary pattern unlikely to meet recommendations for good health and room for improvement. 51-60 More healthful dietary pattern, with some room for improvement.  >60 Healthy dietary pattern, although there may be some specific behaviors that could be improved.   Nutrition Goals Re-Evaluation:  Nutrition Goals Re-Evaluation     [place] Name 03/04/21 0734 04/01/21 0737 04/29/21 0733 06/03/21 0727       Goals   Current Weight -- -- 212 lb (96.2 kg) --    Nutrition Goal ST: add mid morning snakc to avoid probable low BG and intense hunger, check BG at least 1x/day. LT: maintain BG during the day, choose healthier options that follow MyPlate stucture when eating out for lunch. ST: add protein to mid morning snack to avoid probable low BG and intense hunger, check BG at least 1x/day. LT: maintain BG during the day, choose healthier options that follow MyPlate stucture when eating out for lunch. ST: add protein to mid morning snack to avoid probable low BG and intense hunger, check BG at least 1x/day. LT: maintain BG during the day, choose healthier options that follow MyPlate stucture when eating out for lunch. Short: Focus on getting more protein Long: Continue to find balance planning.    Comment Mohamed has not had a lot of appetite.  He is eating once a day.  He has not been checking his sugars like he should.  We talked about adding in a protein shake to help. Continue to try to make a good options. Azlan has added a mid morning snack in the form of some fruit. He does not have a BG monitor at home. He will eat a  peanut butter and jelly sandwich when he is hungry. Is not interested in protein shakes as he feels they are for old people, but is willing to try protein bars or soymilk or nuts and seeds with fruit. He reports his appetite continues to stay low. Tryston has been losing some weight since just before rehab. He is at 212 lb today.  He has not really been focusing on  protein so we talked about it again and the importance of why he needs it.  He is still only eating one meal so we talked about using a protein drink. Tamika was gone on vacation last week and did not stick to his diet. He was eating lots of vegetables.  We talked about focusing in on proteins again.  He eats beans at least three times a week and chicken most other days. He is planning to get back to to his diet this week.    Expected Outcome Short: Try to eat more frequently and try protein shake Long: Continue to make healthy options. ST: add protein to mid morning snack to avoid probable low BG and intense hunger, check BG at least 1x/day. LT: maintain BG during the day, choose healthier options that follow MyPlate stucture when eating out for lunch. Short: Focus on getting more protein Long: Continue to find balance planning. Short: Get back to diet Long: Continue to add in more protein.             Nutrition Goals Discharge (Final Nutrition Goals Re-Evaluation):  Nutrition Goals Re-Evaluation - 06/03/21 0727       Goals   Nutrition Goal Short: Focus on getting more protein Long: Continue to find balance planning.    Comment Bereket was gone on vacation last week and did not stick to his diet. He was eating lots of vegetables.  We talked about focusing in on proteins again.  He eats beans at least three times a week and chicken most other days. He is planning to get back to to his diet this week.    Expected Outcome Short: Get back to diet Long: Continue to add in more protein.             Psychosocial: Target Goals: Acknowledge presence or absence of significant depression and/or stress, maximize coping skills, provide positive support system. Participant is able to verbalize types and ability to use techniques and skills needed for reducing stress and depression.   Education: Stress, Anxiety, and Depression - Group verbal and visual presentation to define topics covered.  Reviews how  body is impacted by stress, anxiety, and depression.  Also discusses healthy ways to reduce stress and to treat/manage anxiety and depression.  Written material given at graduation.   Education: Sleep Hygiene -Provides group verbal and written instruction about how sleep can affect your health.  Define sleep hygiene, discuss sleep cycles and impact of sleep habits. Review good sleep hygiene tips.    Initial Review & Psychosocial Screening:  Initial Psych Review & Screening - 01/24/21 1541       Initial Review   Current issues with Current Stress Concerns    Source of Stress Concerns Family    Comments children stress him. he as MD psychiatrist at [facility]. 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      Family Dynamics   Good Support System? Yes   2 daughters, one son (one in [place] calls everyday other in [place]) and a sister.  .     Barriers   Psychosocial  barriers to participate in program There are no identifiable barriers or psychosocial needs.;The patient should benefit from training in stress management and relaxation.      Screening Interventions   Interventions Provide feedback about the scores to participant;To provide support and resources with identified psychosocial needs;Encouraged to exercise    Expected Outcomes Short Term goal: Utilizing psychosocial counselor, staff and physician to assist with identification of specific Stressors or current issues interfering with healing process. Setting desired goal for each stressor or current issue identified.;Long Term Goal: Stressors or current issues are controlled or eliminated.;Short Term goal: Identification and review with participant of any Quality of Life or Depression concerns found by scoring the questionnaire.;Long Term goal: The participant improves quality of Life and PHQ9 Scores as seen by post scores and/or verbalization of changes             Quality of Life Scores:  Scores of 19 and below usually indicate a poorer quality of  life in these areas.  A difference of  2-3 points is a clinically meaningful difference.  A difference of 2-3 points in the total score of the Quality of Life Index has been associated with significant improvement in overall quality of life, self-image, physical symptoms, and general health in studies assessing change in quality of life.  PHQ-9: Recent Review Flowsheet Data     Depression screen [facility] 2/9 06/03/2021 04/29/2021 04/01/2021 03/04/2021 02/03/2021   Decreased Interest 2 2 1 1 3    Down, Depressed, Hopeless 1 2 2 1 2    PHQ - 2 Score 3 4 3 2 5    Altered sleeping 3 3 3 1  3    Tired, decreased energy 1 2 2 1 3    Change in appetite 2 2 2 3 3    Feeling bad or failure about yourself  0 0 0 0 0   Trouble concentrating 2 2 0 0 0   Moving slowly or fidgety/restless 0 0 0 0 0   Suicidal thoughts 0 0 0 0 0   PHQ-9 Score 11 13 10 7 14    Difficult doing work/chores Somewhat difficult Not difficult at all Not difficult at all Not difficult at all Not difficult at all      Interpretation of Total Score  Total Score Depression Severity:  1-4 = Minimal depression, 5-9 = Mild depression, 10-14 = Moderate depression, 15-19 = Moderately severe depression, 20-27 = Severe depression   Psychosocial Evaluation and Intervention:  Psychosocial Evaluation - 01/24/21 1601       Psychosocial Evaluation & Interventions   Interventions Encouraged to exercise with the program and follow exercise prescription    Comments Larue has no barriers to attending the program. He does travel for work and may need to flex his schedule. He lives alone and has children in the area and a girlfriend. He does get stressed and has a psychiatrist at the [place] that he talks to. He is a smoker and is trting to quit. HIs support poepl are his family. He wants to improve his breathing,have more energy and quit smoking.    Expected Outcomes STG Travonne is able to attend at least 2 days aweek depite his work schedule. He is able to Quit  tobacco. LTG: Kodie has quit and maintains tobacco cessation, he is able to continue any progress he made during the program.    Continue Psychosocial Services  Follow up required by staff             Psychosocial Re-Evaluation:  Psychosocial Re-Evaluation     Row Name 03/04/21 0725 04/01/21 0756 04/29/21 0726 06/03/21 0730       Psychosocial Re-Evaluation   Current issues with Current Depression;Current Sleep Concerns;History of Depression;Current Stress Concerns Current Depression;Current Sleep Concerns;History of Depression;Current Stress Concerns;Current Psychotropic Meds Current Depression;Current Sleep Concerns;History of Depression;Current Stress Concerns;Current Psychotropic Meds Current Depression;Current Sleep Concerns;History of Depression;Current Stress Concerns;Current Psychotropic Meds    Comments Aasim is doing well in rehab.  We did his PHQ test again and his scores are better, but still feeling depressed.  He is seeing the pyschiatrist at [place].  He has nights where he doesn't sleep well and that has been going on for more than 6 months but he has not talked to doctor about any of this. He was encouraged to talk with his doctor to help with sleep. His lack of sleep is making him feel tired. He reports being on medication for anxiety/depression - not sure what it is for exactly or what the medication is - he takes it daily. He sees a psychiatrist who may want to change the medication. He sees a therapist 1x/3 months - he says he cant get in any more frequently due to their schedule; discussed how it would benefit to find a practice he could see more often. His daughter is causing him stress - she always wants something from him. He also finds work depressing. He enjoys working on his yard, but it had been too hard. He also enjoys golfing. He has a smaller appetite and has had for 6-8 months (unsure why). HE has trouble falling asleep and staying asleep - will stay asleep for 1 hour at  a time. His MD is going to prescibe him sleep medication; he denies racing thoughts at night and is not sure why he can't sleep. Discussed different kinds of meditation and suggested trying some out. Daqwan is doing well in rehab.  He redid his PHQ today and it had gone up 3 points from last time.  He continues to have some stress from his daughter. He is taking his antidepressants and anxiety pills.  He finds work stressful too.  He stays in the house and sleeps all weekend.  He does have an appointment with psychiastrist on 10/9.  His PTSD is also interferring with his sleep as he keeps waking and then not able to get back to sleep.  He does have sleep pills.  He is going to theapist every three months.  He is not open to going with online platform.  We talked about trying sleep pills on weekends to see if they help. Dael continues to struggle with sleep.  He is afraid of over sleeping with it so we talked about taking it earlier to not stress over it.  He did enjoy vacation last week.  He continues to feel like his meds are working but that they may need some tweaking.  He has a follow up on Thursday.  His PHQ is down two more points!  He is going to talk to his doctor some more this week.    Expected Outcomes SHort: Talk to doctor about sleep and depression Long: Continue to find the positive. SHort: see how sleep medication helps, see a therapist more frequently, try meditation techniques  Long: Continue to find the positive. Short: Try sleep med on weekend Long: Continue to try to exercise more to sleep better. Short: Talk to doctor on Thursday about depression meds  Long: Continue to improve sleeping  habits.    Interventions Stress management education;Encouraged to attend Cardiac Rehabilitation for the exercise Stress management education;Relaxation education;Encouraged to attend Pulmonary Rehabilitation for the exercise Stress management education;Relaxation education;Encouraged to attend Pulmonary  Rehabilitation for the exercise --    Continue Psychosocial Services  Follow up required by staff Follow up required by staff Follow up required by staff --      Initial Review   Source of Stress Concerns -- Family;Occupation -- --             Psychosocial Discharge (Final Psychosocial Re-Evaluation):  Psychosocial Re-Evaluation - 06/03/21 0730       Psychosocial Re-Evaluation   Current issues with Current Depression;Current Sleep Concerns;History of Depression;Current Stress Concerns;Current Psychotropic Meds    Comments Harbert continues to struggle with sleep.  He is afraid of over sleeping with it so we talked about taking it earlier to not stress over it.  He did enjoy vacation last week.  He continues to feel like his meds are working but that they may need some tweaking.  He has a follow up on Thursday.  His PHQ is down two more points!  He is going to talk to his doctor some more this week.    Expected Outcomes Short: Talk to doctor on Thursday about depression meds  Long: Continue to improve sleeping habits.             Education: Education Goals: Education classes will be provided on a weekly basis, covering required topics. Participant will state understanding/return demonstration of topics presented.  Learning Barriers/Preferences:  Learning Barriers/Preferences - 01/24/21 1546       Learning Barriers/Preferences   Learning Barriers None    Learning Preferences None             General Pulmonary Education Topics:  Infection Prevention: - Provides verbal and written material to individual with discussion of infection control including proper hand washing and proper equipment cleaning during exercise session. Flowsheet Row Pulmonary Rehab from 04/17/2021 in [facility] Cardiac and Pulmonary Rehab  Date 02/03/21  Educator [facility]  Instruction Review Code 1- Verbalizes Understanding       Falls Prevention: - Provides verbal and written material to individual with  discussion of falls prevention and safety. Flowsheet Row Pulmonary Rehab from 04/17/2021 in [facility] Cardiac and Pulmonary Rehab  Date 02/03/21  Educator [facility]  Instruction Review Code 1- Verbalizes Understanding       Chronic Lung Disease Review: - Group verbal instruction with posters, models, PowerPoint presentations and videos,  to review new updates, new respiratory medications, new advancements in procedures and treatments. Providing information on websites and "800" numbers for continued self-education. Includes information about supplement oxygen, available portable oxygen systems, continuous and intermittent flow rates, oxygen safety, concentrators, and Medicare reimbursement for oxygen. Explanation of Pulmonary Drugs, including class, frequency, complications, importance of spacers, rinsing mouth after steroid MDI's, and proper cleaning methods for nebulizers. Review of basic lung anatomy and physiology related to function, structure, and complications of lung disease. Review of risk factors. Discussion about methods for diagnosing sleep apnea and types of masks and machines for OSA. Includes a review of the use of types of environmental controls: home humidity, furnaces, filters, dust mite/pet prevention, HEPA vacuums. Discussion about weather changes, air quality and the benefits of nasal washing. Instruction on Warning signs, infection symptoms, calling MD promptly, preventive modes, and value of vaccinations. Review of effective airway clearance, coughing and/or vibration techniques. Emphasizing that all should Create an Action Plan. Written material  given at graduation. Flowsheet Row Pulmonary Rehab from 04/17/2021 in [facility] Cardiac and Pulmonary Rehab  Education need identified 02/03/21       AED/CPR: - Group verbal and written instruction with the use of models to demonstrate the basic use of the AED with the basic ABC's of resuscitation.    Anatomy and Cardiac Procedures: - Group verbal  and visual presentation and models provide information about basic cardiac anatomy and function. Reviews the testing methods done to diagnose heart disease and the outcomes of the test results. Describes the treatment choices: Medical Management, Angioplasty, or Coronary Bypass Surgery for treating various heart conditions including Myocardial Infarction, Angina, Valve Disease, and Cardiac Arrhythmias.  Written material given at graduation. Flowsheet Row Pulmonary Rehab from 04/17/2021 in [facility] Cardiac and Pulmonary Rehab  Date 04/17/21  Educator [facility]  Instruction Review Code 1- Verbalizes Understanding       Medication Safety: - Group verbal and visual instruction to review commonly prescribed medications for heart and lung disease. Reviews the medication, class of the drug, and side effects. Includes the steps to properly store meds and maintain the prescription regimen.  Written material given at graduation. Flowsheet Row Pulmonary Rehab from 04/17/2021 in [facility] Cardiac and Pulmonary Rehab  Date 03/06/21  Educator [facility]  Instruction Review Code 1- Verbalizes Understanding       Other: -Provides group and verbal instruction on various topics (see comments)   Knowledge Questionnaire Score:  Knowledge Questionnaire Score - 02/03/21 1323       Knowledge Questionnaire Score   Pre Score 13/16 Education Focus: Chonic Lung Disease, O2 safety              Core Components/Risk Factors/Patient Goals at Admission:  Personal Goals and Risk Factors at Admission - 02/03/21 1323       Core Components/Risk Factors/Patient Goals on Admission    Weight Management Yes;Weight Maintenance;Obesity    Intervention Weight Management: Provide education and appropriate resources to help participant work on and attain dietary goals.;Weight Management: Develop a combined nutrition and exercise program designed to reach desired caloric intake, while maintaining appropriate intake of nutrient and fiber, sodium  and fats, and appropriate energy expenditure required for the weight goal.;Weight Management/Obesity: Establish reasonable short term and long term weight goals.;Obesity: Provide education and appropriate resources to help participant work on and attain dietary goals.    Admit Weight 213 lb 9.6 oz (96.9 kg)    Goal Weight: Short Term 208 lb (94.3 kg)    Goal Weight: Long Term 200 lb (90.7 kg)    Expected Outcomes Short Term: Continue to assess and modify interventions until short term weight is achieved;Long Term: Adherence to nutrition and physical activity/exercise program aimed toward attainment of established weight goal;Weight Loss: Understanding of general recommendations for a balanced deficit meal plan, which promotes 1-2 lb weight loss per week and includes a negative energy balance of [phone] kcal/d;Understanding recommendations for meals to include 15-35% energy as protein, 25-35% energy from fat, 35-60% energy from carbohydrates, less than $RemoveB'200mg'xCiUNQiC$  of dietary cholesterol, 20-35 gm of total fiber daily;Understanding of distribution of calorie intake throughout the day with the consumption of 4-5 meals/snacks    Tobacco Cessation Yes    Number of packs per day 1 per day    Intervention Assist the participant in steps to quit. Provide individualized education and counseling about committing to Tobacco Cessation, relapse prevention, and pharmacological support that can be provided by physician.;Advice worker, assist with locating and accessing local/national Quit Smoking  programs, and support quit date choice.    Expected Outcomes Short Term: Will demonstrate readiness to quit, by selecting a quit date.;Short Term: Will quit all tobacco product use, adhering to prevention of relapse plan.;Long Term: Complete abstinence from all tobacco products for at least 12 months from quit date.    Improve shortness of breath with ADL's Yes    Intervention Provide education, individualized  exercise plan and daily activity instruction to help decrease symptoms of SOB with activities of daily living.    Expected Outcomes Short Term: Improve cardiorespiratory fitness to achieve a reduction of symptoms when performing ADLs;Long Term: Be able to perform more ADLs without symptoms or delay the onset of symptoms    Increase knowledge of respiratory medications and ability to use respiratory devices properly  Yes    Intervention Provide education and demonstration as needed of appropriate use of medications, inhalers, and oxygen therapy.    Expected Outcomes Short Term: Achieves understanding of medications use. Understands that oxygen is a medication prescribed by physician. Demonstrates appropriate use of inhaler and oxygen therapy.;Long Term: Maintain appropriate use of medications, inhalers, and oxygen therapy.    Diabetes Yes    Intervention Provide education about signs/symptoms and action to take for hypo/hyperglycemia.;Provide education about proper nutrition, including hydration, and aerobic/resistive exercise prescription along with prescribed medications to achieve blood glucose in normal ranges: Fasting glucose 65-99 mg/dL    Expected Outcomes Short Term: Participant verbalizes understanding of the signs/symptoms and immediate care of hyper/hypoglycemia, proper foot care and importance of medication, aerobic/resistive exercise and nutrition plan for blood glucose control.;Long Term: Attainment of HbA1C < 7%.    Hypertension Yes    Intervention Provide education on lifestyle modifcations including regular physical activity/exercise, weight management, moderate sodium restriction and increased consumption of fresh fruit, vegetables, and low fat dairy, alcohol moderation, and smoking cessation.;Monitor prescription use compliance.    Expected Outcomes Short Term: Continued assessment and intervention until BP is < 140/63mm HG in hypertensive participants. < 130/16mm HG in hypertensive  participants with diabetes, heart failure or chronic kidney disease.;Long Term: Maintenance of blood pressure at goal levels.    Lipids Yes    Intervention Provide education and support for participant on nutrition & aerobic/resistive exercise along with prescribed medications to achieve LDL '70mg'$ , HDL >$Remo'40mg'UiJIy$ .    Expected Outcomes Short Term: Participant states understanding of desired cholesterol values and is compliant with medications prescribed. Participant is following exercise prescription and nutrition guidelines.;Long Term: Cholesterol controlled with medications as prescribed, with individualized exercise RX and with personalized nutrition plan. Value goals: LDL < $Rem'70mg'dskt$ , HDL > 40 mg.             Education:Diabetes - Individual verbal and written instruction to review signs/symptoms of diabetes, desired ranges of glucose level fasting, after meals and with exercise. Acknowledge that pre and post exercise glucose checks will be done for 3 sessions at entry of program. Flowsheet Row Pulmonary Rehab from 04/17/2021 in [facility] Cardiac and Pulmonary Rehab  Date 02/03/21  Educator [facility]  Instruction Review Code 1- Verbalizes Understanding       Know Your Numbers and Heart Failure: - Group verbal and visual instruction to discuss disease risk factors for cardiac and pulmonary disease and treatment options.  Reviews associated critical values for Overweight/Obesity, Hypertension, Cholesterol, and Diabetes.  Discusses basics of heart failure: signs/symptoms and treatments.  Introduces Heart Failure Zone chart for action plan for heart failure.  Written material given at graduation.   Core Components/Risk Factors/Patient Goals  Review:   Goals and Risk Factor Review     Row Name 02/03/21 1326 03/04/21 0729 04/01/21 0740 04/29/21 0737 06/03/21 0722     Core Components/Risk Factors/Patient Goals Review   Personal Goals Review Tobacco Cessation Tobacco Cessation;Weight Management/Obesity;Increase  knowledge of respiratory medications and ability to use respiratory devices properly.;Hypertension;Diabetes;Improve shortness of breath with ADL's Tobacco Cessation;Weight Management/Obesity;Hypertension;Diabetes;Improve shortness of breath with ADL's Tobacco Cessation;Weight Management/Obesity;Hypertension;Diabetes;Improve shortness of breath with ADL's;Increase knowledge of respiratory medications and ability to use respiratory devices properly. Tobacco Cessation;Weight Management/Obesity;Hypertension;Diabetes;Improve shortness of breath with ADL's;Increase knowledge of respiratory medications and ability to use respiratory devices properly.   Review Edu is a current tobacco user. Intervention for tobacco cessation was provided at the initial medical review. He was asked about readiness to quit and reported that he is considering it and has cut back already . Patient was advised and educated about tobacco cessation using combination therapy, tobacco cessation classes, quit line, and quit smoking apps. Patient demonstrated understanding of this material but declined hand outs at this time. Staff will continue to provide encouragement and follow up with the patient throughout the program. Donya is doing well in rehab.  He is still smoking a pack a day and not leaving the house on the weekends.  He was encouraged to call his doctor about the depression. He is not really intersted in quitting yet.  His pressures are still all over the place and he only checks them occassionally at home.  We talked about getting into habit of checking it at home to track it better.  He has not been checking his sugars as he does not feel like doing anything at home.  He is doing well with his meds. His chest has been feeling tight which does not help his breathing. BP 122/60 today - he checks his bp at home - he does not recall how it usually runs, but last night bp 156/70. He is taking his medications as prescribed and he is  having no issues. He does not check his BG at home and is not interested in doing so. He practices PLB and feels that it helps, but his overall breathing has stayed the same. He is interested in quitting smoking, but does not have a quit date and would like to think about one. He reports he has lost weight since starting. Sael's weight has been holding steady.  His pressures are doing well and he is still checking them.  His sugars are doing well.  He is only eating one meal still and not exercising so we talked about how exercise can help with weightloss too.  He is doing better with his breathing and has no problems with his meds.  He continues to smoke but is down to 1-2 a day Lj is doing well in rehab.  His weight was up some today after vacation last week.  He is still around 10 cigarettes a day last week.  He continues to work towards quitting.  He was able to walk and get around the city better than before with less SOB.  Meds are still working well.  His pressures continue to do well in class as he does not check it at home. His sugars feel okay, but he has not been checking them.   Expected Outcomes Short: Continue to reduce number of cigarettes per day Long: Set a quit date Short: Call doctor about depression and check sugars and pressures LOng: Continue to work on a routine to help  with depression. Short: Continue to check BP at home, find his BG monitor, decide on a smoking quit date Long: quit smoking short: More protein Long: Conitnue to focus sleep LOng: Continue to work toward quitting. Short: Get weight back down Long: Continue to work toward cessation.            Core Components/Risk Factors/Patient Goals at Discharge (Final Review):   Goals and Risk Factor Review - 06/03/21 0722       Core Components/Risk Factors/Patient Goals Review   Personal Goals Review Tobacco Cessation;Weight Management/Obesity;Hypertension;Diabetes;Improve shortness of breath with ADL's;Increase  knowledge of respiratory medications and ability to use respiratory devices properly.    Review Myron is doing well in rehab.  His weight was up some today after vacation last week.  He is still around 10 cigarettes a day last week.  He continues to work towards quitting.  He was able to walk and get around the city better than before with less SOB.  Meds are still working well.  His pressures continue to do well in class as he does not check it at home. His sugars feel okay, but he has not been checking them.    Expected Outcomes Short: Get weight back down Long: Continue to work toward cessation.             ITP Comments:  ITP Comments     Row Name 01/24/21 1610 02/03/21 1152 02/04/21 0717 02/05/21 1022 02/26/21 1154   ITP Comments Virtual orientation call completed today. he has an appointment on Date: 02/03/2021  for EP eval and gym Orientation.  Documentation of diagnosis can be found in Media Tab Date: 02/03/2021 adding [facility] notees to Media Tab . Completed 6MWT and gym orientation. Initial ITP created and sent for review to Dr. Zetta Bills, Medical Director. First full day of exercise!  Patient was oriented to gym and equipment including functions, settings, policies, and procedures.  Patient's individual exercise prescription and treatment plan were reviewed.  All starting workloads were established based on the results of the 6 minute walk test done at initial orientation visit.  The plan for exercise progression was also introduced and progression will be customized based on patient's performance and goals. Completed initial RD consultation 30 Day review completed. Medical Director ITP review done, changes made as directed, and signed approval by Medical Director.    [place] Name 03/26/21 0820 04/23/21 0753 05/21/21 1421 06/18/21 0813     ITP Comments 30 Day review completed. Medical Director ITP review done, changes made as directed, and signed approval by Medical Director. 30 Day review  completed. Medical Director ITP review done, changes made as directed, and signed approval by Medical Director. 30 day review completed. ITP sent to Dr. Zetta Bills, Medical Director of  Pulmonary Rehab. Continue with ITP unless changes are made by physician. 30 Day review completed. Medical Director ITP review done, changes made as directed, and signed approval by Medical Director.             Comments:  30 Day review completed. Medical Director ITP review done, changes made as directed, and signed approval by Medical Director.

## 2021-06-19 ENCOUNTER — Other Ambulatory Visit: Payer: Self-pay

## 2021-06-19 ENCOUNTER — Encounter: Payer: No Typology Code available for payment source | Admitting: *Deleted

## 2021-06-19 ENCOUNTER — Ambulatory Visit: Payer: Non-veteran care

## 2021-06-19 DIAGNOSIS — J449 Chronic obstructive pulmonary disease, unspecified: Secondary | ICD-10-CM | POA: Diagnosis not present

## 2021-06-19 NOTE — Progress Notes (Signed)
Daily Session Note  Patient Details  Name: Robert Combs MRN: [id number] Date of Birth: January 31, 1946 Referring Provider:   [name] Pulmonary Rehab from 02/03/2021 in [facility] Cardiac and Pulmonary Rehab  Referring Provider Danton Sewer MD  [facility]       Encounter Date: 06/19/2021  Check In:  Session Check In - 06/19/21 0827       Check-In   Supervising physician immediately available to respond to emergencies See telemetry face sheet for immediately available ER MD    Location [facility]-Cardiac & Pulmonary Rehab    Staff Present Nyoka Cowden, RN, BSN, Tyna Jaksch, MS, ASCM CEP, Exercise Physiologist;Amanda Oletta Darter, [place], ACSM CEP, Exercise Physiologist    Virtual Visit No    Medication changes reported     No    Fall or balance concerns reported    No    Tobacco Cessation No Change    Warm-up and Cool-down Performed on first and last piece of equipment    Resistance Training Performed Yes    VAD Patient? No    PAD/SET Patient? No      Pain Assessment   Currently in Pain? No/denies                Social History   Tobacco Use  Smoking Status Every Day   Packs/day: 1.00   Years: 25.00   Pack years: 25.00   Types: Cigarettes   Last attempt to quit: 07/11/2020   Years since quitting: 0.9  Smokeless Tobacco Never  Tobacco Comments   02/03/21 currently at 1 ppd, [facility] and [facility] is [facility] it    Goals Met:  Independence with exercise equipment Exercise tolerated well No report of concerns or symptoms today  Goals Unmet:  Not Applicable  Comments: Pt able to follow exercise prescription today without complaint.  Will continue to monitor for progression.    Dr. Emily Filbert is Medical Director for [place].  Dr. Ottie Glazier is Medical Director for [facility] Pulmonary Rehabilitation.

## 2021-06-24 ENCOUNTER — Other Ambulatory Visit: Payer: Self-pay

## 2021-06-24 ENCOUNTER — Ambulatory Visit: Payer: Non-veteran care

## 2021-06-24 DIAGNOSIS — J449 Chronic obstructive pulmonary disease, unspecified: Secondary | ICD-10-CM

## 2021-06-24 NOTE — Progress Notes (Signed)
Daily Session Note  Patient Details  Name: Robert Combs MRN: [id number] Date of Birth: 28-Oct-1945 Referring Provider:   [name] Pulmonary Rehab from 02/03/2021 in [facility] Cardiac and Pulmonary Rehab  Referring Provider Danton Sewer MD  [facility]       Encounter Date: 06/24/2021  Check In:  Session Check In - 06/24/21 0718       Check-In   Supervising physician immediately available to respond to emergencies See telemetry face sheet for immediately available ER MD    Location [facility]-Cardiac & Pulmonary Rehab    Staff Present Birdie Sons, MPA, RN;Amanda Oletta Darter, BA, ACSM CEP, Exercise Physiologist;Melissa Caiola, RDN, LDN    Virtual Visit No    Medication changes reported     No    Fall or balance concerns reported    No    Tobacco Cessation No Change    Warm-up and Cool-down Performed on first and last piece of equipment    Resistance Training Performed Yes    VAD Patient? No    PAD/SET Patient? No      Pain Assessment   Currently in Pain? No/denies                Social History   Tobacco Use  Smoking Status Every Day   Packs/day: 1.00   Years: 25.00   Pack years: 25.00   Types: Cigarettes   Last attempt to quit: 07/11/2020   Years since quitting: 0.9  Smokeless Tobacco Never  Tobacco Comments   02/03/21 currently at 1 ppd, [facility] and he is considering it    Goals Met:  Independence with exercise equipment Exercise tolerated well No report of concerns or symptoms today Strength training completed today  Goals Unmet:  Not Applicable  Comments: Pt able to follow exercise prescription today without complaint.  Will continue to monitor for progression.    Dr. Emily Filbert is Medical Director for [place].  Dr. Ottie Glazier is Medical Director for [facility] Pulmonary Rehabilitation.

## 2021-06-26 ENCOUNTER — Other Ambulatory Visit: Payer: Self-pay

## 2021-06-26 ENCOUNTER — Ambulatory Visit: Payer: Non-veteran care

## 2021-06-26 VITALS — Ht 70.1 in | Wt 213.2 lb

## 2021-06-26 DIAGNOSIS — J449 Chronic obstructive pulmonary disease, unspecified: Secondary | ICD-10-CM | POA: Diagnosis not present

## 2021-06-26 NOTE — Patient Instructions (Signed)
Discharge Patient Instructions  Patient Details  Name: Robert Combs MRN: [id number] Date of Birth: December 10, 1945 Referring Provider:  Kathee Delton, MD   Number of Visits: 28  Reason for Discharge:  Patient reached a stable level of exercise. Patient independent in their exercise. Patient has met program and personal goals.  Smoking History:  Social History   Tobacco Use  Smoking Status Every Day   Packs/day: 1.00   Years: 25.00   Pack years: 25.00   Types: Cigarettes   Last attempt to quit: 07/11/2020   Years since quitting: 0.9  Smokeless Tobacco Never  Tobacco Comments   02/03/21 currently at 1 ppd, [facility] is [facility]    Diagnosis:  Chronic obstructive pulmonary disease, unspecified COPD type [facility])  Initial Exercise Prescription:  Initial Exercise Prescription - 02/03/21 1300       Date of Initial Exercise RX and Referring Provider   Date 02/03/21    Referring Provider Danton Sewer MD   [facility]     Treadmill   MPH 2.2    Grade 0.5    Minutes 15    METs 2.84      Recumbant Bike   Level 2    RPM 50    Watts 22    Minutes 15    METs 2.6      Biostep-RELP   Level 2    SPM 50    Minutes 15    METs 2      Track   Laps 30    Minutes 15    METs 2.6      Prescription Details   Frequency (times per week) 2    Duration Progress to 30 minutes of continuous aerobic without signs/symptoms of physical distress      Intensity   THRR 40-80% of Max Heartrate 95-130    Ratings of Perceived Exertion 11-13    Perceived Dyspnea 0-4      Progression   Progression Continue to progress workloads to maintain intensity without signs/symptoms of physical distress.      Resistance Training   Training Prescription Yes    Weight 4 lb    Reps 10-15             Discharge Exercise Prescription (Final Exercise Prescription Changes):  Exercise Prescription Changes - 06/23/21 1400       Response to Exercise   Blood  Pressure (Admit) 104/60    Blood Pressure (Exit) 114/68    Heart Rate (Admit) 57 bpm    Heart Rate (Exercise) 86 bpm    Heart Rate (Exit) 66 bpm    Oxygen Saturation (Admit) 93 %    Oxygen Saturation (Exercise) 91 %    Oxygen Saturation (Exit) 95 %    Rating of Perceived Exertion (Exercise) 13    Perceived Dyspnea (Exercise) 1    Symptoms none    Duration Continue with 30 min of aerobic exercise without signs/symptoms of physical distress.    Intensity THRR unchanged      Progression   Progression Continue to progress workloads to maintain intensity without signs/symptoms of physical distress.    Average METs 3.2      Resistance Training   Training Prescription Yes    Weight 7 lb    Reps 10-15      Interval Training   Interval Training No      NuStep   Level 5    Minutes 15  Track   Laps 50    Minutes 15    METs 3.72      Home Exercise Plan   Plans to continue exercise at Home (comment)   walking   Frequency Add 2 additional days to program exercise sessions.    Initial Home Exercises Provided 03/04/21      Oxygen   Maintain Oxygen Saturation 88% or higher             Functional Capacity:  6 Minute Walk     Row Name 02/03/21 1152 06/26/21 0737       6 Minute Walk   Phase Initial Discharge    Distance 1380 feet 1440 feet    Distance % Change -- 4 %    Distance Feet Change -- 60 ft    Walk Time 6 minutes 6 minutes    # of Rest Breaks 0 0    MPH 2.61 2.73    METS 2.72 2.45    RPE 11 13    Perceived Dyspnea  3 0    VO2 Peak 9.51 8.58    Symptoms Yes (comment) No    Comments legs burning, SOB --    Resting HR 60 bpm 53 bpm    Resting BP 122/62 128/70    Resting Oxygen Saturation  94 % 92 %    Exercise Oxygen Saturation  during 6 min walk 91 % 89 %    Max Ex. HR 83 bpm 86 bpm    Max Ex. BP 156/74 140/70    2 Minute Post BP 132/70 124/62      Interval HR   1 Minute HR 59 68    2 Minute HR 66 83    3 Minute HR 67 85    4 Minute HR 82 86     5 Minute HR 83 85    6 Minute HR 70 85    2 Minute Post HR 65 58    Interval Heart Rate? Yes Yes      Interval Oxygen   Interval Oxygen? Yes Yes    Baseline Oxygen Saturation % 94 % 92 %    1 Minute Oxygen Saturation % 93 % 93 %    1 Minute Liters of Oxygen 0 L  Room Air 0 L    2 Minute Oxygen Saturation % 92 % 91 %    2 Minute Liters of Oxygen 0 L 0 L    3 Minute Oxygen Saturation % 91 % 89 %    3 Minute Liters of Oxygen 0 L 0 L    4 Minute Oxygen Saturation % 92 % 90 %    4 Minute Liters of Oxygen 0 L 0 L    5 Minute Oxygen Saturation % 91 % 90 %    5 Minute Liters of Oxygen 0 L 0 L    6 Minute Oxygen Saturation % 91 % 90 %    6 Minute Liters of Oxygen 0 L 0 L    2 Minute Post Oxygen Saturation % 96 % 95 %    2 Minute Post Liters of Oxygen 0 L 0 L                Nutrition & Weight - Outcomes:  Pre Biometrics - 02/03/21 1320       Pre Biometrics   Height 5' 10.1" (1.781 m)    Weight 213 lb 9.6 oz (96.9 kg)    BMI (Calculated) 30.55  Single Leg Stand 6.5 seconds             Post Biometrics - 06/26/21 3734        Post  Biometrics   Height 5' 10.1" (1.781 m)    Weight 213 lb 3.2 oz (96.7 kg)    BMI (Calculated) 30.49    Single Leg Stand 14.97 seconds             Nutrition:  Nutrition Therapy & Goals - 02/05/21 0948       Nutrition Therapy   Diet Heart healthy, low Na, diabetes friendly    Protein (specify units) 110-115g    Fiber 30 grams    Whole Grain Foods 3 servings    Saturated Fats 12 max. grams    Fruits and Vegetables 8 servings/day    Sodium 1.5 grams      Personal Nutrition Goals   Nutrition Goal ST: add mid morning snakc to avoid probable low BG and intense hunger, check BG at least 1x/day. LT: maintain BG during the day, choose healthier options that follow MyPlate stucture when eating out for lunch.    Comments He doesn't normally check his BG at home. His MD said his BG was good when he was last in per [place]. He reports no  shortness of breath during or after eating. He reports that his appetite is lower than used to be. 221lb--> 214lbs in 3 weeks and feels the difference in his clothes. He feels his appetite was the same over this time. B: weekend: bacon and egg sandwich (1/2) L: go out: vegetables and meat or sandwiches (hamburger, hot dog, bolonga sandwich) D: cabbage and green beans, baked chicken, pork chop, corn, potatoes -baked, spaghetti with meat sauce and salad (not fond of pasta), he does not like rice, uses butter and olive oil, he reports not liking salt so she will use a little bit of salt - girlfriend cooks. He reports waiting until he gets "extremely hungry" to eat. He feels hunger when his legs get weak or he has stomach pains. He feels it's hard for him to get balanced meal at work when he goes out for lunch. We will start with a mid morning snack to avoid extreme hunger and probable low BG and then move to modifying lunch. Discussed importance of checking BG - he reports having all the tools to check and what to do for low BG. Discussed heart healthy eaitng, diabetes friendly eating, and COPD friendly eating.      Intervention Plan   Intervention Prescribe, educate and counsel regarding individualized specific dietary modifications aiming towards targeted core components such as weight, hypertension, lipid management, diabetes, heart failure and other comorbidities.    Expected Outcomes Short Term Goal: Understand basic principles of dietary content, such as calories, fat, sodium, cholesterol and nutrients.;Short Term Goal: A plan has been developed with personal nutrition goals set during dietitian appointment.;Long Term Goal: Adherence to prescribed nutrition plan.             Nutrition Discharge:   Education Questionnaire Score:  Knowledge Questionnaire Score - 02/03/21 1323       Knowledge Questionnaire Score   Pre Score 13/16 Education Focus: Chonic Lung Disease, O2 safety              Goals reviewed with patient; copy given to patient.

## 2021-06-26 NOTE — Progress Notes (Signed)
Daily Session Note  Patient Details  Name: Robert Combs MRN: [id number] Date of Birth: 10/05/1945 Referring Provider:   Flowsheet Row Pulmonary Rehab from 02/03/2021 in [facility] Cardiac and Pulmonary Rehab  Referring Provider Clance, Keith MD  [[facility]]       Encounter Date: 06/26/2021  Check In:  Session Check In - 06/26/21 0657       Check-In   Supervising physician immediately available to respond to emergencies See telemetry face sheet for immediately available ER MD    Location [facility]-Cardiac & Pulmonary Rehab    Staff Present Kelly Bollinger, MPA, RN;Jessica Hawkins, MA, RCEP, CCRP, CCET;Kelly Hayes, BS, ACSM CEP, Exercise Physiologist    Virtual Visit No    Medication changes reported     No    Fall or balance concerns reported    No    Tobacco Cessation No Change    Warm-up and Cool-down Performed on first and last piece of equipment    Resistance Training Performed Yes    VAD Patient? No    PAD/SET Patient? No      Pain Assessment   Currently in Pain? No/denies                Social History   Tobacco Use  Smoking Status Every Day   Packs/day: 1.00   Years: 25.00   Pack years: 25.00   Types: Cigarettes   Last attempt to quit: 07/11/2020   Years since quitting: 0.9  Smokeless Tobacco Never  Tobacco Comments   02/03/21 currently at 1 ppd, [facility] and he is considering it    Goals Met:  Independence with exercise equipment Exercise tolerated well No report of concerns or symptoms today Strength training completed today  Goals Unmet:  Not Applicable  Comments: Pt able to follow exercise prescription today without complaint.  Will continue to monitor for progression.  6 Minute Walk     Row Name 02/03/21 1152 06/26/21 0737       6 Minute Walk   Phase Initial Discharge    Distance 1380 feet 1440 feet    Distance % Change -- 4 %    Distance Feet Change -- 60 ft    Walk Time 6 minutes 6 minutes    # of Rest Breaks 0 0    MPH  2.61 2.73    METS 2.72 2.45    RPE 11 13    Perceived Dyspnea  3 0    VO2 Peak 9.51 8.58    Symptoms Yes (comment) No    Comments legs burning, SOB --    Resting HR 60 bpm 53 bpm    Resting BP 122/62 128/70    Resting Oxygen Saturation  94 % 92 %    Exercise Oxygen Saturation  during 6 min walk 91 % 89 %    Max Ex. HR 83 bpm 86 bpm    Max Ex. BP 156/74 140/70    2 Minute Post BP 132/70 124/62      Interval HR   1 Minute HR 59 68    2 Minute HR 66 83    3 Minute HR 67 85    4 Minute HR 82 86    5 Minute HR 83 85    6 Minute HR 70 85    2 Minute Post HR 65 58    Interval Heart Rate? Yes Yes      Interval Oxygen   Interval Oxygen? Yes Yes      Baseline Oxygen Saturation % 94 % 92 %    1 Minute Oxygen Saturation % 93 % 93 %    1 Minute Liters of Oxygen 0 L  Room Air 0 L    2 Minute Oxygen Saturation % 92 % 91 %    2 Minute Liters of Oxygen 0 L 0 L    3 Minute Oxygen Saturation % 91 % 89 %    3 Minute Liters of Oxygen 0 L 0 L    4 Minute Oxygen Saturation % 92 % 90 %    4 Minute Liters of Oxygen 0 L 0 L    5 Minute Oxygen Saturation % 91 % 90 %    5 Minute Liters of Oxygen 0 L 0 L    6 Minute Oxygen Saturation % 91 % 90 %    6 Minute Liters of Oxygen 0 L 0 L    2 Minute Post Oxygen Saturation % 96 % 95 %    2 Minute Post Liters of Oxygen 0 L 0 L                Dr. Emily Filbert is Medical Director for [place].  Dr. Ottie Glazier is Medical Director for [facility] Pulmonary Rehabilitation.

## 2021-07-01 ENCOUNTER — Other Ambulatory Visit: Payer: Self-pay

## 2021-07-01 ENCOUNTER — Ambulatory Visit: Payer: Non-veteran care

## 2021-07-01 DIAGNOSIS — J449 Chronic obstructive pulmonary disease, unspecified: Secondary | ICD-10-CM

## 2021-07-01 NOTE — Progress Notes (Signed)
Daily Session Note  Patient Details  Name: Robert Combs MRN: [id number] Date of Birth: 1945/10/12 Referring Provider:   [name] Pulmonary Rehab from 02/03/2021 in [facility] Cardiac and Pulmonary Rehab  Referring Provider Danton Sewer MD  [facility]       Encounter Date: 07/01/2021  Check In:  Session Check In - 07/01/21 0708       Check-In   Supervising physician immediately available to respond to emergencies See telemetry face sheet for immediately available ER MD    Location [facility] Pul[facility]    Staff Present Birdie Sons, MPA, RN;Amanda Oletta Darter, BA, ACSM CEP, Exercise Physiologist;Jessica Luan Pulling, MA, RCEP, CCRP, CCET    Virtual Visit No    Medication changes reported     No    Fall or balance concerns reported    No    Tobacco Cessation No Change    Warm-up and Cool-down Performed on first and last piece of equipment    Resistance Training Performed Yes    VAD Patient? No    PAD/SET Patient? No      Pain Assessment   Currently in Pain? No/denies                Social History   Tobacco Use  Smoking Status Every Day   Packs/day: 1.00   Years: 25.00   Pack years: 25.00   Types: Cigarettes   Last attempt to quit: 07/11/2020   Years since quitting: 0.9  Smokeless Tobacco Never  Tobacco Comments   02/03/21 currently at 1 ppd, [facility] and he is considering it    Goals Met:  Independence with exercise equipment Exercise tolerated well No report of concerns or symptoms today Strength training completed today  Goals Unmet:  Not Applicable  Comments: Pt able to follow exercise prescription today without complaint.  Will continue to monitor for progression.    Dr. Emily Filbert is Medical Director for [facility].  Dr. Ottie Glazier is Medical Director for [facility] Pulmonary Rehabilitation.

## 2021-07-03 ENCOUNTER — Other Ambulatory Visit: Payer: Self-pay

## 2021-07-03 ENCOUNTER — Ambulatory Visit: Payer: Non-veteran care

## 2021-07-03 DIAGNOSIS — J449 Chronic obstructive pulmonary disease, unspecified: Secondary | ICD-10-CM

## 2021-07-03 NOTE — Progress Notes (Signed)
Daily Session Note  Patient Details  Name: Robert Combs MRN: [id number] Date of Birth: [birth year] Referring Provider:   April Combs Pulmonary Rehab from 02/03/2021 in [facility] Pulmonary Re[facility]  Referring Provider Robert Sewer MD  [facility]       Encounter Date: 07/03/2021  Check In:  Session Check In - 07/03/21 0719       Check-In   Supervising physician immediately available to respond to emergencies See telemetry face sheet for immediately available ER MD    Location [facility]    Staff Present Robert Combs, MPA, RN;Robert Combs, BA, ACSM CEP, Exercise Physiologist;Robert Eliezer Bottom, MS, ASCM CEP, Exercise Physiologist;Robert Combs, BS, ACSM CEP, Exercise Physiologist    Virtual Visit No    Medication changes reported     No    Fall or balance concerns reported    No    Tobacco Cessation No Change    Warm-up and Cool-down Performed on first and last piece of equipment    Resistance Training Performed Yes    VAD Patient? No    PAD/SET Patient? No      Pain Assessment   Currently in Pain? No/denies                Social History   Tobacco Use  Smoking Status Every Day   Packs/day: 1.00   Years: 25.00   Pack years: 25.00   Types: Cigarettes   Last attempt to quit: 07/11/2020   Years since quitting: 0.9  Smokeless Tobacco Never  Tobacco Comments   02/03/21 currently at 1 ppd, [facility] wants [facility] and he is considering it    Goals Met:  Independence with exercise equipment Exercise tolerated well No report of concerns or symptoms today Strength training completed today  Goals Unmet:  Not Applicable  Comments: Pt able to follow exercise prescription today without complaint.  Will continue to monitor for progression.    Dr. Emily Combs is Medical Director for [place].  Dr. Ottie Combs is Medical Director for [facility] Pulmonary Rehabilitation.

## 2021-07-03 NOTE — Patient Instructions (Signed)
Discharge Patient Instructions  Patient Details  Name: Robert Combs MRN: [id number] Date of Birth: December 10, 1945 Referring Provider:  Kathee Delton, MD   Number of Visits: 28  Reason for Discharge:  Patient reached a stable level of exercise. Patient independent in their exercise. Patient has met program and personal goals.  Smoking History:  Social History   Tobacco Use  Smoking Status Every Day   Packs/day: 1.00   Years: 25.00   Pack years: 25.00   Types: Cigarettes   Last attempt to quit: 07/11/2020   Years since quitting: 0.9  Smokeless Tobacco Never  Tobacco Comments   02/03/21 currently at 1 ppd, [facility] wants [facility] and he is considering it    Diagnosis:  Chronic obstructive pulmonary disease, unspecified COPD type [facility])  Initial Exercise Prescription:  Initial Exercise Prescription - 02/03/21 1300       Date of Initial Exercise RX and Referring Provider   Date 02/03/21    Referring Provider Danton Sewer MD   [facility]     Treadmill   MPH 2.2    Grade 0.5    Minutes 15    METs 2.84      Recumbant Bike   Level 2    RPM 50    Watts 22    Minutes 15    METs 2.6      Biostep-RELP   Level 2    SPM 50    Minutes 15    METs 2      Track   Laps 30    Minutes 15    METs 2.6      Prescription Details   Frequency (times per week) 2    Duration Progress to 30 minutes of continuous aerobic without signs/symptoms of physical distress      Intensity   THRR 40-80% of Max Heartrate 95-130    Ratings of Perceived Exertion 11-13    Perceived Dyspnea 0-4      Progression   Progression Continue to progress workloads to maintain intensity without signs/symptoms of physical distress.      Resistance Training   Training Prescription Yes    Weight 4 lb    Reps 10-15             Discharge Exercise Prescription (Final Exercise Prescription Changes):  Exercise Prescription Changes - 06/23/21 1400       Response to Exercise   Blood  Pressure (Admit) 104/60    Blood Pressure (Exit) 114/68    Heart Rate (Admit) 57 bpm    Heart Rate (Exercise) 86 bpm    Heart Rate (Exit) 66 bpm    Oxygen Saturation (Admit) 93 %    Oxygen Saturation (Exercise) 91 %    Oxygen Saturation (Exit) 95 %    Rating of Perceived Exertion (Exercise) 13    Perceived Dyspnea (Exercise) 1    Symptoms none    Duration Continue with 30 min of aerobic exercise without signs/symptoms of physical distress.    Intensity THRR unchanged      Progression   Progression Continue to progress workloads to maintain intensity without signs/symptoms of physical distress.    Average METs 3.2      Resistance Training   Training Prescription Yes    Weight 7 lb    Reps 10-15      Interval Training   Interval Training No      NuStep   Level 5    Minutes 15  Track   Laps 50    Minutes 15    METs 3.72      Home Exercise Plan   Plans to continue exercise at Home (comment)   walking   Frequency Add 2 additional days to program exercise sessions.    Initial Home Exercises Provided 03/04/21      Oxygen   Maintain Oxygen Saturation 88% or higher             Functional Capacity:  6 Minute Walk     Row Name 02/03/21 1152 06/26/21 0737       6 Minute Walk   Phase Initial Discharge    Distance 1380 feet 1440 feet    Distance % Change -- 4 %    Distance Feet Change -- 60 ft    Walk Time 6 minutes 6 minutes    # of Rest Breaks 0 0    MPH 2.61 2.73    METS 2.72 2.45    RPE 11 13    Perceived Dyspnea  3 0    VO2 Peak 9.51 8.58    Symptoms Yes (comment) No    Comments legs burning, SOB --    Resting HR 60 bpm 53 bpm    Resting BP 122/62 128/70    Resting Oxygen Saturation  94 % 92 %    Exercise Oxygen Saturation  during 6 min walk 91 % 89 %    Max Ex. HR 83 bpm 86 bpm    Max Ex. BP 156/74 140/70    2 Minute Post BP 132/70 124/62      Interval HR   1 Minute HR 59 68    2 Minute HR 66 83    3 Minute HR 67 85    4 Minute HR 82 86     5 Minute HR 83 85    6 Minute HR 70 85    2 Minute Post HR 65 58    Interval Heart Rate? Yes Yes      Interval Oxygen   Interval Oxygen? Yes Yes    Baseline Oxygen Saturation % 94 % 92 %    1 Minute Oxygen Saturation % 93 % 93 %    1 Minute Liters of Oxygen 0 L  Room Air 0 L    2 Minute Oxygen Saturation % 92 % 91 %    2 Minute Liters of Oxygen 0 L 0 L    3 Minute Oxygen Saturation % 91 % 89 %    3 Minute Liters of Oxygen 0 L 0 L    4 Minute Oxygen Saturation % 92 % 90 %    4 Minute Liters of Oxygen 0 L 0 L    5 Minute Oxygen Saturation % 91 % 90 %    5 Minute Liters of Oxygen 0 L 0 L    6 Minute Oxygen Saturation % 91 % 90 %    6 Minute Liters of Oxygen 0 L 0 L    2 Minute Post Oxygen Saturation % 96 % 95 %    2 Minute Post Liters of Oxygen 0 L 0 L               Nutrition & Weight - Outcomes:  Pre Biometrics - 02/03/21 1320       Pre Biometrics   Height 5' 10.1" (1.781 m)    Weight 213 lb 9.6 oz (96.9 kg)    BMI (Calculated) 30.55    Single  Leg Stand 6.5 seconds             Post Biometrics - 06/26/21 4235        Post  Biometrics   Height 5' 10.1" (1.781 m)    Weight 213 lb 3.2 oz (96.7 kg)    BMI (Calculated) 30.49    Single Leg Stand 14.97 seconds             Nutrition:  Nutrition Therapy & Goals - 02/05/21 0948       Nutrition Therapy   Diet Heart healthy, low Na, diabetes friendly    Protein (specify units) 110-115g    Fiber 30 grams    Whole Grain Foods 3 servings    Saturated Fats 12 max. grams    Fruits and Vegetables 8 servings/day    Sodium 1.5 grams      Personal Nutrition Goals   Nutrition Goal ST: add mid morning snakc to avoid probable low BG and intense hunger, check BG at least 1x/day. LT: maintain BG during the day, choose healthier options that follow MyPlate stucture when eating out for lunch.    Comments He doesn't normally check his BG at home. His MD said his BG was good when he was last in per [place]. He reports no  shortness of breath during or after eating. He reports that his appetite is lower than used to be. 221lb--> 214lbs in 3 weeks and feels the difference in his clothes. He feels his appetite was the same over this time. B: weekend: bacon and egg sandwich (1/2) L: go out: vegetables and meat or sandwiches (hamburger, hot dog, bolonga sandwich) D: cabbage and green beans, baked chicken, pork chop, corn, potatoes -baked, spaghetti with meat sauce and salad (not fond of pasta), he does not like rice, uses butter and olive oil, he reports not liking salt so she will use a little bit of salt - girlfriend cooks. He reports waiting until he gets "extremely hungry" to eat. He feels hunger when his legs get weak or he has stomach pains. He feels it's hard for him to get balanced meal at work when he goes out for lunch. We will start with a mid morning snack to avoid extreme hunger and probable low BG and then move to modifying lunch. Discussed importance of checking BG - he reports having all the tools to check and what to do for low BG. Discussed heart healthy eaitng, diabetes friendly eating, and COPD friendly eating.      Intervention Plan   Intervention Prescribe, educate and counsel regarding individualized specific dietary modifications aiming towards targeted core components such as weight, hypertension, lipid management, diabetes, heart failure and other comorbidities.    Expected Outcomes Short Term Goal: Understand basic principles of dietary content, such as calories, fat, sodium, cholesterol and nutrients.;Short Term Goal: A plan has been developed with personal nutrition goals set during dietitian appointment.;Long Term Goal: Adherence to prescribed nutrition plan.              Goals reviewed with patient; copy given to patient.

## 2021-07-08 ENCOUNTER — Ambulatory Visit: Payer: Non-veteran care

## 2021-07-08 ENCOUNTER — Other Ambulatory Visit: Payer: Self-pay

## 2021-07-08 DIAGNOSIS — J449 Chronic obstructive pulmonary disease, unspecified: Secondary | ICD-10-CM | POA: Diagnosis not present

## 2021-07-08 NOTE — Progress Notes (Signed)
Daily Session Note  Patient Details  Name: Robert Combs MRN: [id number] Date of Birth: [birth year] Referring Provider:   [name] Pulmonary Rehab from 02/03/2021 in [facility] Cardiac and Pulmonary Rehab  Referring Provider Danton Sewer MD  [facility]       Encounter Date: 07/08/2021  Check In:  Session Check In - 07/08/21 0710       Check-In   Supervising physician immediately available to respond to emergencies See telemetry face sheet for immediately available ER MD    Location [facility] Pul[facility] Re[facility]    Staff Present Birdie Sons, MPA, RN;Melissa [place], RDN, Rowe Pavy, BA, ACSM CEP, Exercise Physiologist    Virtual Visit No    Medication changes reported     No    Fall or balance concerns reported    No    Tobacco Cessation No Change    Warm-up and Cool-down Performed on first and last piece of equipment    Resistance Training Performed Yes    VAD Patient? No    PAD/SET Patient? No      Pain Assessment   Currently in Pain? No/denies                Social History   Tobacco Use  Smoking Status Every Day   Packs/day: 1.00   Years: 25.00   Pack years: 25.00   Types: Cigarettes   Last attempt to quit: 07/11/2020   Years since quitting: 0.9  Smokeless Tobacco Never  Tobacco Comments   02/03/21 currently at 1 ppd, [facility] wants [facility] to [facility] and he is considering it    Goals Met:  Independence with exercise equipment Exercise tolerated well No report of concerns or symptoms today Strength training completed today  Goals Unmet:  Not Applicable  Comments: Pt able to follow exercise prescription today without complaint.  Will continue to monitor for progression.    Dr. Emily Filbert is Medical Director for [place].  Dr. Ottie Glazier is Medical Director for [facility] Pulmonary Rehabilitation.

## 2021-07-15 ENCOUNTER — Other Ambulatory Visit: Payer: Self-pay

## 2021-07-15 DIAGNOSIS — J449 Chronic obstructive pulmonary disease, unspecified: Secondary | ICD-10-CM

## 2021-07-15 NOTE — Progress Notes (Signed)
Daily Session Note  Patient Details  Name: TEAGEN MCLEARY MRN: [id number] Date of Birth: [birth year] Referring Provider:   [name] [facility] from 02/03/2021 in [facility]  Referring Provider Danton Sewer MD  [facility]]       Encounter Date: 07/15/2021  Check In:  Session Check In - 07/15/21 0702       Check-In   Supervising physician immediately available to respond to emergencies See telemetry face sheet for immediately available ER MD    Location [facility]    Staff Present Birdie Sons, MPA, RN;Jessica [place], MA, RCEP, CCRP, CCET;Amanda Sommer, BA, ACSM CEP, Exercise Physiologist    Virtual Visit No    Medication changes reported     No    Fall or balance concerns reported    Yes    Comments tripped and fell, knee is sore but otherwise no injury    Tobacco Cessation No Change    Warm-up and Cool-down Performed on first and last piece of equipment    Resistance Training Performed Yes    VAD Patient? No    PAD/SET Patient? No      Pain Assessment   Currently in Pain? No/denies                Social History   Tobacco Use  Smoking Status Every Day   Packs/day: 1.00   Years: 25.00   Pack years: 25.00   Types: Cigarettes   Last attempt to quit: 07/11/2020   Years since quitting: 1.0  Smokeless Tobacco Never  Tobacco Comments   02/03/21 currently at 1 ppd, [facility] and [facility] is [facility] it    Goals Met:  Independence with exercise equipment Exercise tolerated well No report of concerns or symptoms today Strength training completed today  Goals Unmet:  Not Applicable  Comments: Pt able to follow exercise prescription today without complaint.  Will continue to monitor for progression.    Dr. Emily Filbert is Medical Director for [facility].  Dr. Ottie Glazier is Medical Director for [facility].

## 2021-07-16 ENCOUNTER — Encounter: Payer: Self-pay | Admitting: *Deleted

## 2021-07-16 DIAGNOSIS — J449 Chronic obstructive pulmonary disease, unspecified: Secondary | ICD-10-CM

## 2021-07-16 NOTE — Progress Notes (Signed)
Pulmonary Individual Treatment Plan  Patient Details  Name: EMITT MAGLIONE MRN: [id number] Date of Birth: 03-13-1946 Referring Provider:   [name] Pulmonary Rehab from 02/03/2021 in [facility] Cardiac and Pulmonary Rehab  Referring Provider Danton Sewer MD  [facility]]       Initial Encounter Date:  Flowsheet Row Pulmonary Rehab from 02/03/2021 in [facility] Cardiac and Pulmonary Rehab  Date 02/03/21       Visit Diagnosis: Chronic obstructive pulmonary disease, unspecified COPD type ([place])  Patient's Home Medications on Admission:  Current Outpatient Medications:    albuterol (PROVENTIL HFA;VENTOLIN HFA) 108 (90 Base) MCG/ACT inhaler, Inhale 1 puff into the lungs every 6 (six) hours as needed for wheezing or shortness of breath., Disp: , Rfl:    albuterol (VENTOLIN HFA) 108 (90 Base) MCG/ACT inhaler, Inhale into the lungs., Disp: , Rfl:    Alogliptin Benzoate 25 MG TABS, Take by mouth., Disp: , Rfl:    Ascorbic Acid (VITAMIN C) 100 MG tablet, Take 100 mg by mouth daily. (Patient not taking: Reported on 01/24/2021), Disp: , Rfl:    aspirin EC 81 MG tablet, Take 81 mg by mouth daily.  , Disp: , Rfl:    budesonide-formoterol (SYMBICORT) 160-4.5 MCG/ACT inhaler, Inhale into the lungs., Disp: , Rfl:    cholecalciferol (VITAMIN D) 1000 UNITS tablet, Take 1,000 Units by mouth daily.  , Disp: , Rfl:    losartan (COZAAR) 50 MG tablet, Take 25 mg by mouth daily., Disp: , Rfl:    metFORMIN (GLUCOPHAGE) 500 MG tablet, Take 1 tablet (500 mg) PO daily x 1 week, then increase to 1 tablet PO BID thereafter, Disp: 60 tablet, Rfl: 0   mometasone (ASMANEX) 220 MCG/INH inhaler, Take by mouth., Disp: , Rfl:    omeprazole (PRILOSEC OTC) 20 MG tablet, Take 20 mg by mouth daily., Disp: , Rfl:    traMADol (ULTRAM) 50 MG tablet, Take 1 tablet (50 mg total) by mouth every 6 (six) hours as needed., Disp: 10 tablet, Rfl: 0  Past Medical History: Past Medical History:  Diagnosis Date   COPD (chronic  obstructive pulmonary disease) ([place])    Coronary artery disease    DDD (degenerative disc disease)    Hypertension     Tobacco Use: Social History   Tobacco Use  Smoking Status Every Day   Packs/day: 1.00   Years: 25.00   Pack years: 25.00   Types: Cigarettes   Last attempt to quit: 07/11/2020   Years since quitting: 1.0  Smokeless Tobacco Never  Tobacco Comments   02/03/21 currently at 1 ppd, [facility] is [facility]    Labs: Recent Review Flowsheet Data     Labs for ITP Cardiac and Pulmonary Rehab Latest Ref Rng & Units 02/03/2010 11/04/2015   HCO3 20.0 - 24.0 mEq/L - 26.3(H)   TCO2 0 - 100 mmol/L 31 28   O2SAT % - 75.0        Pulmonary Assessment Scores:  Pulmonary Assessment Scores     Row Name 02/03/21 1328 06/26/21 0959       ADL UCSD   ADL Phase Entry Exit    SOB Score total 57 --    Rest 3 --    Walk 4 --    Stairs 5 --    Bath 0 --    Dress 0 --    Shop 2 --      CAT Score   CAT Score 37 --  mMRC Score   mMRC Score 0 0             UCSD: Self-administered rating of dyspnea associated with activities of daily living (ADLs) 6-point scale (0 = "not at all" to 5 = "maximal or unable to do because of breathlessness")  Scoring Scores range from 0 to 120.  Minimally important difference is 5 units  CAT: CAT can identify the health impairment of COPD patients and is better correlated with disease progression.  CAT has a scoring range of zero to 40. The CAT score is classified into four groups of low (less than 10), medium (10 - 20), high (21-30) and very high (31-40) based on the impact level of disease on health status. A CAT score over 10 suggests significant symptoms.  A worsening CAT score could be explained by an exacerbation, poor medication adherence, poor inhaler technique, or progression of COPD or comorbid conditions.  CAT MCID is 2 points  mMRC: mMRC (Modified Medical Research Council) Dyspnea Scale is used to  assess the degree of baseline functional disability in patients of respiratory disease due to dyspnea. No minimal important difference is established. A decrease in score of 1 point or greater is considered a positive change.   Pulmonary Function Assessment:  Pulmonary Function Assessment - 02/03/21 1327       Breath   Shortness of Breath Yes;Fear of Shortness of Breath;Limiting activity             Exercise Target Goals: Exercise Program Goal: Individual exercise prescription set using results from initial 6 min walk test and THRR while considering  patient's activity barriers and safety.   Exercise Prescription Goal: Initial exercise prescription builds to 30-45 minutes a day of aerobic activity, 2-3 days per week.  Home exercise guidelines will be given to patient during program as part of exercise prescription that the participant will acknowledge.  Education: Aerobic Exercise: - Group verbal and visual presentation on the components of exercise prescription. Introduces F.I.T.T principle from ACSM for exercise prescriptions.  Reviews F.I.T.T. principles of aerobic exercise including progression. Written material given at graduation. Flowsheet Row Pulmonary Rehab from 04/17/2021 in [facility] Cardiac [facility] Pulmonary Rehab  Date 02/06/21  Educator [facility]  Instruction Review Code 1- Verbalizes Understanding       Education: Resistance Exercise: - Group verbal and visual presentation on the components of exercise prescription. Introduces F.I.T.T principle from ACSM for exercise prescriptions  Reviews F.I.T.T. principles of resistance exercise including progression. Written material given at graduation. Flowsheet Row Pulmonary Rehab from 04/17/2021 in [facility] Cardiac and Pulmonary Rehab  Date 04/17/21  Educator [facility]  Instruction Review Code 1- Verbalizes Understanding        Education: Exercise & Equipment Safety: - Individual verbal instruction and demonstration of equipment use and safety  with use of the equipment. Flowsheet Row Pulmonary Rehab from 04/17/2021 in [facility] Cardiac and Pulmonary Rehab  Date 02/03/21  Educator [facility]  Instruction Review Code 1- Verbalizes Understanding       Education: Exercise Physiology & General Exercise Guidelines: - Group verbal and written instruction with models to review the exercise physiology of the cardiovascular system and associated critical values. Provides general exercise guidelines with specific guidelines to those with heart or lung disease.    Education: Flexibility, Balance, Mind/Body Relaxation: - Group verbal and visual presentation with interactive activity on the components of exercise prescription. Introduces F.I.T.T principle from ACSM for exercise prescriptions. Reviews F.I.T.T. principles of flexibility and balance exercise training including progression. Also  discusses the mind body connection.  Reviews various relaxation techniques to help reduce and manage stress (i.e. Deep breathing, progressive muscle relaxation, and visualization). Balance handout provided to take home. Written material given at graduation. Flowsheet Row Pulmonary Rehab from 04/17/2021 in [facility] Cardiac and Pulmonary Rehab  Date 02/20/21  Educator [facility]  Instruction Review Code 1- Verbalizes Understanding       Activity Barriers & Risk Stratification:  Activity Barriers & Cardiac Risk Stratification - 02/03/21 1154       Activity Barriers & Cardiac Risk Stratification   Activity Barriers Shortness of Breath;Joint Problems;Deconditioning;Muscular Weakness;Balance Concerns             6 Minute Walk:  6 Minute Walk     Row Name 02/03/21 1152 06/26/21 0737       6 Minute Walk   Phase Initial Discharge    Distance 1380 feet 1440 feet    Distance % Change -- 4 %    Distance Feet Change -- 60 ft    Walk Time 6 minutes 6 minutes    # of Rest Breaks 0 0    MPH 2.61 2.73    METS 2.72 2.45    RPE 11 13    Perceived Dyspnea  3 0    VO2 Peak 9.51  8.58    Symptoms Yes (comment) No    Comments legs burning, SOB --    Resting HR 60 bpm 53 bpm    Resting BP 122/62 128/70    Resting Oxygen Saturation  94 % 92 %    Exercise Oxygen Saturation  during 6 min walk 91 % 89 %    Max Ex. HR 83 bpm 86 bpm    Max Ex. BP 156/74 140/70    2 Minute Post BP 132/70 124/62      Interval HR   1 Minute HR 59 68    2 Minute HR 66 83    3 Minute HR 67 85    4 Minute HR 82 86    5 Minute HR 83 85    6 Minute HR 70 85    2 Minute Post HR 65 58    Interval Heart Rate? Yes Yes      Interval Oxygen   Interval Oxygen? Yes Yes    Baseline Oxygen Saturation % 94 % 92 %    1 Minute Oxygen Saturation % 93 % 93 %    1 Minute Liters of Oxygen 0 L  Room Air 0 L    2 Minute Oxygen Saturation % 92 % 91 %    2 Minute Liters of Oxygen 0 L 0 L    3 Minute Oxygen Saturation % 91 % 89 %    3 Minute Liters of Oxygen 0 L 0 L    4 Minute Oxygen Saturation % 92 % 90 %    4 Minute Liters of Oxygen 0 L 0 L    5 Minute Oxygen Saturation % 91 % 90 %    5 Minute Liters of Oxygen 0 L 0 L    6 Minute Oxygen Saturation % 91 % 90 %    6 Minute Liters of Oxygen 0 L 0 L    2 Minute Post Oxygen Saturation % 96 % 95 %    2 Minute Post Liters of Oxygen 0 L 0 L            Oxygen Initial Assessment:  Oxygen Initial Assessment - 02/03/21 1327  Home Oxygen   Home Oxygen Device None    Sleep Oxygen Prescription None    Home Exercise Oxygen Prescription None    Home Resting Oxygen Prescription None      Initial 6 min Walk   Oxygen Used None      Program Oxygen Prescription   Program Oxygen Prescription None      Intervention   Short Term Goals To learn and demonstrate proper pursed lip breathing techniques or other breathing techniques. ;To learn and demonstrate proper use of respiratory medications;To learn and understand importance of maintaining oxygen saturations>88%;To learn and understand importance of monitoring SPO2 with pulse oximeter and demonstrate  accurate use of the pulse oximeter.    Long  Term Goals Exhibits proper breathing techniques, such as pursed lip breathing or other method taught during program session;Compliance with respiratory medication;Maintenance of O2 saturations>88%;Verbalizes importance of monitoring SPO2 with pulse oximeter and return demonstration             Oxygen Re-Evaluation:  Oxygen Re-Evaluation     Row Name 02/04/21 0718 03/04/21 0737 04/01/21 0733 04/29/21 0739 06/03/21 0738     Program Oxygen Prescription   Program Oxygen Prescription _0      Home Oxygen   Home Oxygen Device _1    Sleep Oxygen Prescription _2    Home Exercise Oxygen Prescription _3    Home Resting Oxygen Prescription _4    Compliance with Home Oxygen Use Yes -- Yes -- Yes     Goals/Expected Outcomes   Short Term Goals To learn and demonstrate proper pursed lip breathing techniques or other breathing techniques. ;To learn and understand importance of maintaining oxygen saturations>88%;To learn and understand importance of monitoring SPO2 with pulse oximeter and demonstrate accurate use of the pulse oximeter. To learn and demonstrate proper pursed lip breathing techniques or other breathing techniques. ;To learn and understand importance of maintaining oxygen saturations>88%;To learn and understand importance of monitoring SPO2 with pulse oximeter and demonstrate accurate use of the pulse oximeter. To learn and demonstrate proper pursed lip breathing techniques or other breathing techniques. ;To learn and understand importance of maintaining oxygen saturations>88%;To learn and understand importance of monitoring SPO2 with pulse oximeter and demonstrate accurate use of the pulse oximeter. To learn and understand importance of monitoring SPO2 with pulse oximeter and demonstrate accurate use of the pulse oximeter.;To learn and understand  importance of maintaining oxygen saturations>88%;To learn and demonstrate proper pursed lip breathing techniques or other breathing techniques. ;To learn and demonstrate proper use of respiratory medications To learn and understand importance of monitoring SPO2 with pulse oximeter and demonstrate accurate use of the pulse oximeter.;To learn and understand importance of maintaining oxygen saturations>88%;To learn and demonstrate proper pursed lip breathing techniques or other breathing techniques. ;To learn and demonstrate proper use of respiratory medications   Long  Term Goals Exhibits proper breathing techniques, such as pursed lip breathing or other method taught during program session;Compliance with respiratory medication;Maintenance of O2 saturations>88%;Verbalizes importance of monitoring SPO2 with pulse oximeter and return demonstration Exhibits proper breathing techniques, such as pursed lip breathing or other method taught during program session;Compliance with respiratory medication;Maintenance of O2 saturations>88%;Verbalizes importance of monitoring SPO2 with pulse oximeter and return demonstration Exhibits proper breathing techniques, such as pursed lip breathing or other method taught during program session;Compliance with respiratory medication;Maintenance of O2 saturations>88%;Verbalizes importance of monitoring SPO2 with pulse oximeter and return demonstration Verbalizes importance  of monitoring SPO2 with pulse oximeter and return demonstration;Maintenance of O2 saturations>88%;Exhibits proper breathing techniques, such as pursed lip breathing or other method taught during program session;Compliance with respiratory medication Verbalizes importance of monitoring SPO2 with pulse oximeter and return demonstration;Maintenance of O2 saturations>88%;Exhibits proper breathing techniques, such as pursed lip breathing or other method taught during program session;Compliance with respiratory medication    Comments Reviewed PLB technique with pt.  Talked about how it works and it's importance in maintaining their exercise saturations. Daiveon is doing well in rehab. His breathing is getting better, but he is stil smoking and his chest feels tight.  We talked about quitting smoking to help. He does use his PLB with exercise. Doniven reports his breathing is about the time. He does not have a pulse oximeter at home, discussed importance of checking oxygen. He continues to practice PLB and feels that it helps. He is still smoking, he is smoking the same amount, but is interested in quitting. He has quit 4-5 times in 3 years, when he feels triggered he will start again. Parmvir is doing well with rehab.  His breathing is getting better and monitors his sats at home.  He uses his PLB when he feels short of breath but not before then. Rigley is doing well in rehab.  His breathing continues to improve!  He was able to enjoy walking on his vacation last week.  He continues to work on his PLB. He does continue to smoke too.   Goals/Expected Outcomes Short: Become more profiecient at using PLB.   Long: Become independent at using PLB. short: Continue to work on breathing Long: Use PLB more frequently short: Think about a quit date Long: Use PLB more frequently short: Think about a quit date Long: Use PLB more frequently Short: Continue to exercise to improve breathing Long: Continue to use PLB for breath control.    [place] Name 06/26/21 0755             Program Oxygen Prescription   Program Oxygen Prescription None         Home Oxygen   Home Oxygen Device None       Sleep Oxygen Prescription None       Home Exercise Oxygen Prescription None       Home Resting Oxygen Prescription None       Compliance with Home Oxygen Use Yes         Goals/Expected Outcomes   Short Term Goals To learn and understand importance of monitoring SPO2 with pulse oximeter and demonstrate accurate use of the pulse oximeter.;To learn and  understand importance of maintaining oxygen saturations>88%;To learn and demonstrate proper pursed lip breathing techniques or other breathing techniques. ;To learn and demonstrate proper use of respiratory medications       Long  Term Goals Verbalizes importance of monitoring SPO2 with pulse oximeter and return demonstration;Maintenance of O2 saturations>88%;Exhibits proper breathing techniques, such as pursed lip breathing or other method taught during program session;Compliance with respiratory medication       Comments Kahiau is getting ready to graduate from pulmonary rehab and will continue to exercise and use proper breathing techniques to control SOB and improve his ability to do ADL.       Goals/Expected Outcomes Short: Continue to exercise to improve breathing Long: Continue to use PLB for breath control.                Oxygen Discharge (Final Oxygen Re-Evaluation):  Oxygen Re-Evaluation - 06/26/21  0755       Program Oxygen Prescription   Program Oxygen Prescription None      Home Oxygen   Home Oxygen Device None    Sleep Oxygen Prescription None    Home Exercise Oxygen Prescription None    Home Resting Oxygen Prescription None    Compliance with Home Oxygen Use Yes      Goals/Expected Outcomes   Short Term Goals To learn and understand importance of monitoring SPO2 with pulse oximeter and demonstrate accurate use of the pulse oximeter.;To learn and understand importance of maintaining oxygen saturations>88%;To learn and demonstrate proper pursed lip breathing techniques or other breathing techniques. ;To learn and demonstrate proper use of respiratory medications    Long  Term Goals Verbalizes importance of monitoring SPO2 with pulse oximeter and return demonstration;Maintenance of O2 saturations>88%;Exhibits proper breathing techniques, such as pursed lip breathing or other method taught during program session;Compliance with respiratory medication    Comments Jakyren is  getting ready to graduate from pulmonary rehab and will continue to exercise and use proper breathing techniques to control SOB and improve his ability to do ADL.    Goals/Expected Outcomes Short: Continue to exercise to improve breathing Long: Continue to use PLB for breath control.             Initial Exercise Prescription:  Initial Exercise Prescription - 02/03/21 1300       Date of Initial Exercise RX and Referring Provider   Date 02/03/21    Referring Provider Danton Sewer MD   [facility]     Treadmill   MPH 2.2    Grade 0.5    Minutes 15    METs 2.84      Recumbant Bike   Level 2    RPM 50    Watts 22    Minutes 15    METs 2.6      Biostep-RELP   Level 2    SPM 50    Minutes 15    METs 2      Track   Laps 30    Minutes 15    METs 2.6      Prescription Details   Frequency (times per week) 2    Duration Progress to 30 minutes of continuous aerobic without signs/symptoms of physical distress      Intensity   THRR 40-80% of Max Heartrate 95-130    Ratings of Perceived Exertion 11-13    Perceived Dyspnea 0-4      Progression   Progression Continue to progress workloads to maintain intensity without signs/symptoms of physical distress.      Resistance Training   Training Prescription Yes    Weight 4 lb    Reps 10-15             Perform Capillary Blood Glucose checks as needed.  Exercise Prescription Changes:   Exercise Prescription Changes     Row Name 02/03/21 1300 02/18/21 1500 03/05/21 1700 03/18/21 0700 04/01/21 1400     Response to Exercise   Blood Pressure (Admit) 122/62 98/54 110/64 128/64 122/62   Blood Pressure (Exercise) 156/74 120/60 138/66 132/62 --   Blood Pressure (Exit) 110/66 112/60 124/64 112/66 130/82   Heart Rate (Admit) 60 bpm 65 bpm 58 bpm 88 bpm 52 bpm   Heart Rate (Exercise) 83 bpm 94 bpm 96 bpm 93 bpm 88 bpm   Heart Rate (Exit) 62 bpm 61 bpm 66 bpm 80 bpm 73 bpm   Oxygen Saturation (Admit) 94 %  89 % 94 % 94 % 94  %   Oxygen Saturation (Exercise) 91 % 90 % 93 % 91 % 92 %   Oxygen Saturation (Exit) 94 % 95 % 95 % 94 % 93 %   Rating of Perceived Exertion (Exercise) _0 Perceived Dyspnea (Exercise) 3 0 2 0 1   Symptoms SOB, legs burning none -- none none   Comments walk test results -- -- -- --   Duration -- Progress to 30 minutes of  aerobic without signs/symptoms of physical distress Continue with 30 min of aerobic exercise without signs/symptoms of physical distress. Continue with 30 min of aerobic exercise without signs/symptoms of physical distress. Continue with 30 min of aerobic exercise without signs/symptoms of physical distress.   Intensity -- THRR unchanged THRR unchanged THRR unchanged THRR unchanged     Progression   Progression -- Continue to progress workloads to maintain intensity without signs/symptoms of physical distress. Continue to progress workloads to maintain intensity without signs/symptoms of physical distress. Continue to progress workloads to maintain intensity without signs/symptoms of physical distress. Continue to progress workloads to maintain intensity without signs/symptoms of physical distress.   Average METs -- 2.4 2.2 3.02 3     Resistance Training   Training Prescription -- Yes Yes Yes Yes   Weight -- 4 lb 5 lb 5 lb 6 lb   Reps -- 10-15 10-15 10-15 10-15     Interval Training   Interval Training -- No No No No     Treadmill   MPH -- 2 -- 2.5 --   Grade -- 0 -- 0.5 --   Minutes -- 15 -- 15 --   METs -- 2.53 -- 3.09 --     Recumbant Bike   Level -- 2 -- 5 --   Watts -- 19 -- 33 --   Minutes -- 15 -- 15 --   METs -- 2.66 -- 3.1 --     Biostep-RELP   Level -- 2 2 -- 5   Minutes -- 15 15 -- 15   METs -- 2 2 -- 3     Track   Laps -- 27 27 -- 39   Minutes -- 15 15 -- 15   METs -- 2.47 2.47 -- 3.12    Row Name 04/14/21 1500 04/29/21 1300 05/13/21 1200 05/26/21 1500 06/09/21 1200     Response to Exercise   Blood Pressure (Admit) 126/64  110/62 124/62 134/62 110/70   Blood Pressure (Exit) 132/82 110/64 128/58 122/64 112/62   Heart Rate (Admit) 87 bpm 50 bpm 50 bpm 64 bpm 55 bpm   Heart Rate (Exercise) 86 bpm 67 bpm 79 bpm 87 bpm 81 bpm   Heart Rate (Exit) 72 bpm 59 bpm 70 bpm 82 bpm 56 bpm   Oxygen Saturation (Admit) 93 % 94 % 93 % 92 % 93 %   Oxygen Saturation (Exercise) 89 % 90 % 90 % 90 % 90 %   Oxygen Saturation (Exit) 95 % 94 % 94 % 95 % 95 %   Rating of Perceived Exertion (Exercise) _1 Perceived Dyspnea (Exercise) 1 0 _2 Symptoms none none none -- none   Duration Continue with 30 min of aerobic exercise without signs/symptoms of physical distress. Continue with 30 min of aerobic exercise without signs/symptoms of physical distress. Continue with 30 min of aerobic exercise without signs/symptoms of physical distress. Continue with 30 min  of aerobic exercise without signs/symptoms of physical distress. Continue with 30 min of aerobic exercise without signs/symptoms of physical distress.   Intensity _0      Progression   Progression Continue to progress workloads to maintain intensity without signs/symptoms of physical distress. Continue to progress workloads to maintain intensity without signs/symptoms of physical distress. Continue to progress workloads to maintain intensity without signs/symptoms of physical distress. Continue to progress workloads to maintain intensity without signs/symptoms of physical distress. Continue to progress workloads to maintain intensity without signs/symptoms of physical distress.   Average METs 2.91 3.25 3.12 2.6 2.6     Resistance Training   Training Prescription _1    Weight 6 lb 7 lb 7 lb 7 lb 7 lb   Reps 10-15 10-15 10-15 10-15 10-15     Interval Training   Interval Training _2      Treadmill   MPH 2.7 -- 2.5 -- --   Grade 1 -- 1.5 -- --   Minutes 15 -- 15 -- --    METs 3.44 -- 3.23 -- --     Recumbant Bike   Level 3 -- 5 -- 5   Watts 25 -- 27 -- 22   Minutes 15 -- 15 -- 15   METs 3.79 -- 2.83 -- 2.71     NuStep   Level -- -- -- -- 5   Minutes -- -- -- -- 15   METs -- -- -- -- 2.5     Biostep-RELP   Level _3 --   Minutes _4 --   METs _5 --     Track   Laps 44 46 41 20 --   Minutes _6 --   METs 3.39 3.5 3.23 -- --     Home Exercise Plan   Plans to continue exercise at Home (comment)  walking Home (comment)  walking Home (comment)  walking Home (comment)  walking Home (comment)  walking   Frequency Add 2 additional days to program exercise sessions. Add 2 additional days to program exercise sessions. Add 2 additional days to program exercise sessions. Add 2 additional days to program exercise sessions. Add 2 additional days to program exercise sessions.   Initial Home Exercises Provided 03/04/21 03/04/21 03/04/21 03/04/21 03/04/21     Oxygen   Maintain Oxygen Saturation -- -- 88% or higher -- 88% or higher    Row Name 06/23/21 1400 07/07/21 1500           Response to Exercise   Blood Pressure (Admit) 104/60 114/68      Blood Pressure (Exit) 114/68 118/62      Heart Rate (Admit) 57 bpm 57 bpm      Heart Rate (Exercise) 86 bpm 82 bpm      Heart Rate (Exit) 66 bpm 83 bpm      Oxygen Saturation (Admit) 93 % 94 %      Oxygen Saturation (Exercise) 91 % 92 %      Oxygen Saturation (Exit) 95 % 93 %      Rating of Perceived Exertion (Exercise) 13 13      Perceived Dyspnea (Exercise) 1 1      Symptoms none none      Duration Continue with 30 min of aerobic exercise without signs/symptoms of physical distress. Continue with 30 min of aerobic exercise without signs/symptoms of  physical distress.      Intensity THRR unchanged THRR unchanged        Progression   Progression Continue to progress workloads to maintain intensity without signs/symptoms of physical distress. Continue to progress workloads to maintain  intensity without signs/symptoms of physical distress.      Average METs 3.2 2.53        Resistance Training   Training Prescription Yes Yes      Weight 7 lb 7 lb      Reps 10-15 10-15        Interval Training   Interval Training No No        Recumbant Bike   Level -- 4      Minutes -- 15      METs -- 3.8        NuStep   Level 5 5      Minutes 15 15      METs -- 2.2        Biostep-RELP   Level -- 5      Minutes -- 15      METs -- 2        Track   Laps 50 21      Minutes 15 15      METs 3.72 2.14        Home Exercise Plan   Plans to continue exercise at Home (comment)  walking Home (comment)  walking      Frequency Add 2 additional days to program exercise sessions. Add 2 additional days to program exercise sessions.      Initial Home Exercises Provided 03/04/21 03/04/21        Oxygen   Maintain Oxygen Saturation 88% or higher 88% or higher               Exercise Comments:   Exercise Comments     Row Name 02/04/21 0717           Exercise Comments First full day of exercise!  Patient was oriented to gym and equipment including functions, settings, policies, and procedures.  Patient's individual exercise prescription and treatment plan were reviewed.  All starting workloads were established based on the results of the 6 minute walk test done at initial orientation visit.  The plan for exercise progression was also introduced and progression will be customized based on patient's performance and goals.                Exercise Goals and Review:   Exercise Goals     Row Name 02/03/21 1319             Exercise Goals   Increase Physical Activity Yes       Intervention Provide advice, education, support and counseling about physical activity/exercise needs.;Develop an individualized exercise prescription for aerobic and resistive training based on initial evaluation findings, risk stratification, comorbidities and participant's personal goals.        Expected Outcomes Short Term: Attend rehab on a regular basis to increase amount of physical activity.;Long Term: Add in home exercise to make exercise part of routine and to increase amount of physical activity.;Long Term: Exercising regularly at least 3-5 days a week.       Increase Strength and Stamina Yes       Intervention Provide advice, education, support and counseling about physical activity/exercise needs.;Develop an individualized exercise prescription for aerobic and resistive training based on initial evaluation findings, risk stratification, comorbidities and participant's personal goals.  Expected Outcomes Short Term: Increase workloads from initial exercise prescription for resistance, speed, and METs.;Short Term: Perform resistance training exercises routinely during rehab and add in resistance training at home;Long Term: Improve cardiorespiratory fitness, muscular endurance and strength as measured by increased METs and functional capacity (6MWT)       Able to understand and use rate of perceived exertion (RPE) scale Yes       Intervention Provide education and explanation on how to use RPE scale       Expected Outcomes Short Term: Able to use RPE daily in rehab to express subjective intensity level;Long Term:  Able to use RPE to guide intensity level when exercising independently       Able to understand and use Dyspnea scale Yes       Intervention Provide education and explanation on how to use Dyspnea scale       Expected Outcomes Short Term: Able to use Dyspnea scale daily in rehab to express subjective sense of shortness of breath during exertion;Long Term: Able to use Dyspnea scale to guide intensity level when exercising independently       Knowledge and understanding of Target Heart Rate Range (THRR) Yes       Intervention Provide education and explanation of THRR including how the numbers were predicted and where they are located for reference       Expected Outcomes Short  Term: Able to use daily as guideline for intensity in rehab;Short Term: Able to state/look up THRR;Long Term: Able to use THRR to govern intensity when exercising independently       Able to check pulse independently Yes       Intervention Provide education and demonstration on how to check pulse in carotid and radial arteries.;Review the importance of being able to check your own pulse for safety during independent exercise       Expected Outcomes Short Term: Able to explain why pulse checking is important during independent exercise;Long Term: Able to check pulse independently and accurately       Understanding of Exercise Prescription Yes       Intervention Provide education, explanation, and written materials on patient's individual exercise prescription       Expected Outcomes Short Term: Able to explain program exercise prescription;Long Term: Able to explain home exercise prescription to exercise independently                Exercise Goals Re-Evaluation :  Exercise Goals Re-Evaluation     Row Name 02/04/21 0717 02/18/21 1509 03/04/21 0746 03/18/21 0749 04/01/21 0732     Exercise Goal Re-Evaluation   Exercise Goals Review Knowledge and understanding of Target Heart Rate Range (THRR);Increase Physical Activity;Able to understand and use rate of perceived exertion (RPE) scale;Understanding of Exercise Prescription;Able to understand and use Dyspnea scale;Able to check pulse independently;Increase Strength and Stamina Increase Physical Activity;Increase Strength and Stamina;Understanding of Exercise Prescription Increase Physical Activity;Increase Strength and Stamina;Understanding of Exercise Prescription;Able to understand and use rate of perceived exertion (RPE) scale;Able to understand and use Dyspnea scale;Knowledge and understanding of Target Heart Rate Range (THRR);Able to check pulse independently Increase Physical Activity;Increase Strength and Stamina;Understanding of Exercise  Prescription Increase Physical Activity;Increase Strength and Stamina;Understanding of Exercise Prescription   Comments Reviewed RPE and dyspnea scales, THR and program prescription with pt today.  Pt voiced understanding and was given a copy of goals to take home. Ala is off to a good start in rehab.  He has only completed his first 4  full days of exercise thus far.  He is at 2.0 mph on the treadmill.  We will continue to monitor his progress and encourage good attendance. Nasim is doing well in rehab.  He is have some symptoms of depression and we talked about exercising can be helpful.  Reviewed home exercise with pt today.  Pt plans to walk at home and use staff videos for exercise.  Reviewed THR, pulse, RPE, sign and symptoms, pulse oximetery and when to call 911 or MD.  Also discussed weather considerations and indoor options.  Pt voiced understanding. Akai does not have the most consistent attendance due to his work that takes him out of town frequently.  He does do well in rehab when present.  He is up to level 5 on the recumbent bike.  We continue to monitor his progress. Nicola reports walking a thome 30 minutes on the days he does not come here (RPE 12). He reports he is not as consistent as he would like to be. Discussed adding warm up and cool down and monitoring O2.   Expected Outcomes Short: Use RPE daily to regulate intensity. Long: Follow program prescription in THR. Short: Attend rehab regularly Long: Continue to improve stamina. Short: get into routine with exercise Long: Continue to improve stamina Short: walk when out of town to maintain Long: Conitnue to improve stamina. Short: walk when not at rehab consistently Long: Conitnue to improve stamina.    [place] Name 04/14/21 1456 04/29/21 0732 04/29/21 1316 05/13/21 1229 05/26/21 1602     Exercise Goal Re-Evaluation   Exercise Goals Review Increase Physical Activity;Increase Strength and Stamina;Understanding of Exercise Prescription  Increase Physical Activity;Increase Strength and Stamina;Understanding of Exercise Prescription Increase Physical Activity;Increase Strength and Stamina Increase Physical Activity;Increase Strength and Stamina;Understanding of Exercise Prescription --   Comments Jeyren is doing well in rehab. He is up to 44 laps on the track which is over a mile!  We will continue to monitor his progress. Xavi is doing well in rehab. The routine of coming to class helps.  He does yard work once a week to keep active.  We talked about trying to exericse more for the mental boost. -- Braylin is doing well in rehab.  He enjoys coming to class but is not as active when not here still.  He is up to 41 laps today!  We will continue to monitor his progress. Sonam continues to do well.  He uses 7 lb for strength work.  Staff will review importance of home exercise.   Expected Outcomes Short: Continue to walk more Long: Continue to improve stamina Short: More exericse on off days Long: Continue to improve stamina. -- Short: Try to add in more exercise Long: Continue to improve stmaina. Short: get 1-2 days of exercise outside program sessions Long:build overall stamina    Row Name 06/03/21 0720 06/09/21 1220 06/23/21 1440 06/26/21 0747 07/07/21 1519     Exercise Goal Re-Evaluation   Exercise Goals Review Increase Physical Activity;Increase Strength and Stamina;Understanding of Exercise Prescription Increase Physical Activity;Increase Strength and Stamina Increase Physical Activity;Increase Strength and Stamina Increase Physical Activity;Increase Strength and Stamina Increase Physical Activity;Increase Strength and Stamina;Understanding of Exercise Prescription   Comments Kdyn is doing well in rehab.  He is still not doing his home exercise. He was out vacation last week in [place]. He was able to walk some more than his normal up there.   He is feeling better and stronger than when he started. Viyaan is doing  well in rehab. He  tolerated the Nustep T4 well for the first time doing it. His oxygen saturations are staying above 88%. RPEs are in appropriate ranges. Will continue to monitor. Askia did 50 lapson the track last time!  He is due for post 6MWT test soon.  We expect to see improvement on post walk. Upon graduation from pulmonary rehab [place] plans to walk at home, play more golf, and possibly join the [facility] with his silver sneakers program. 6 min walk test performed today. Lyndal increase from a pre test of 1380 to 1440 feet today. Shawnn only has 3 more sessions.  He is ready to graduate and has a plan to walk at home.   Expected Outcomes Short: Get in more exercise in at home.  Long: Continue build stamina. Short: Continue to build up laps on the track Long: Continue to increase overall MET level and strength Short: improve post walk Long:  complete [facility] Short: Writer from pulmonary rehab. Long: Maintain independent exercise program. Graduate            Discharge Exercise Prescription (Final Exercise Prescription Changes):  Exercise Prescription Changes - 07/07/21 1500       Response to Exercise   Blood Pressure (Admit) 114/68    Blood Pressure (Exit) 118/62    Heart Rate (Admit) 57 bpm    Heart Rate (Exercise) 82 bpm    Heart Rate (Exit) 83 bpm    Oxygen Saturation (Admit) 94 %    Oxygen Saturation (Exercise) 92 %    Oxygen Saturation (Exit) 93 %    Rating of Perceived Exertion (Exercise) 13    Perceived Dyspnea (Exercise) 1    Symptoms none    Duration Continue with 30 min of aerobic exercise without signs/symptoms of physical distress.    Intensity THRR unchanged      Progression   Progression Continue to progress workloads to maintain intensity without signs/symptoms of physical distress.    Average METs 2.53      Resistance Training   Training Prescription Yes    Weight 7 lb    Reps 10-15      Interval Training   Interval Training No      Recumbant Bike   Level 4    Minutes 15     METs 3.8      NuStep   Level 5    Minutes 15    METs 2.2      Biostep-RELP   Level 5    Minutes 15    METs 2      Track   Laps 21    Minutes 15    METs 2.14      Home Exercise Plan   Plans to continue exercise at Home (comment)   walking   Frequency Add 2 additional days to program exercise sessions.    Initial Home Exercises Provided 03/04/21      Oxygen   Maintain Oxygen Saturation 88% or higher             Nutrition:  Target Goals: Understanding of nutrition guidelines, daily intake of sodium <1539m, cholesterol <2079m calories 30% from fat and 7% or less from saturated fats, daily to have 5 or more servings of fruits and vegetables.  Education: All About Nutrition: -Group instruction provided by verbal, written material, interactive activities, discussions, models, and posters to present general guidelines for heart healthy nutrition including fat, fiber, MyPlate, the role of sodium in heart healthy nutrition, utilization of the  nutrition label, and utilization of this knowledge for meal planning. Follow up email sent as well. Written material given at graduation.   Biometrics:  Pre Biometrics - 02/03/21 1320       Pre Biometrics   Height 5' 10.1" (1.781 m)    Weight 213 lb 9.6 oz (96.9 kg)    BMI (Calculated) 30.55    Single Leg Stand 6.5 seconds             Post Biometrics - 06/26/21 0742        Post  Biometrics   Height 5' 10.1" (1.781 m)    Weight 213 lb 3.2 oz (96.7 kg)    BMI (Calculated) 30.49    Single Leg Stand 14.97 seconds             Nutrition Therapy Plan and Nutrition Goals:  Nutrition Therapy & Goals - 02/05/21 0948       Nutrition Therapy   Diet Heart healthy, low Na, diabetes friendly    Protein (specify units) 110-115g    Fiber 30 grams    Whole Grain Foods 3 servings    Saturated Fats 12 max. grams    Fruits and Vegetables 8 servings/day    Sodium 1.5 grams      Personal Nutrition Goals   Nutrition Goal ST:  add mid morning snakc to avoid probable low BG and intense hunger, check BG at least 1x/day. LT: maintain BG during the day, choose healthier options that follow MyPlate stucture when eating out for lunch.    Comments He doesn't normally check his BG at home. His MD said his BG was good when he was last in per [place]. He reports no shortness of breath during or after eating. He reports that his appetite is lower than used to be. 221lb--> 214lbs in 3 weeks and feels the difference in his clothes. He feels his appetite was the same over this time. B: weekend: bacon and egg sandwich (1/2) L: go out: vegetables and meat or sandwiches (hamburger, hot dog, bolonga sandwich) D: cabbage and green beans, baked chicken, pork chop, corn, potatoes -baked, spaghetti with meat sauce and salad (not fond of pasta), he does not like rice, uses butter and olive oil, he reports not liking salt so she will use a little bit of salt - girlfriend cooks. He reports waiting until he gets "extremely hungry" to eat. He feels hunger when his legs get weak or he has stomach pains. He feels it's hard for him to get balanced meal at work when he goes out for lunch. We will start with a mid morning snack to avoid extreme hunger and probable low BG and then move to modifying lunch. Discussed importance of checking BG - he reports having all the tools to check and what to do for low BG. Discussed heart healthy eaitng, diabetes friendly eating, and COPD friendly eating.      Intervention Plan   Intervention Prescribe, educate and counsel regarding individualized specific dietary modifications aiming towards targeted core components such as weight, hypertension, lipid management, diabetes, heart failure and other comorbidities.    Expected Outcomes Short Term Goal: Understand basic principles of dietary content, such as calories, fat, sodium, cholesterol and nutrients.;Short Term Goal: A plan has been developed with personal nutrition goals set  during dietitian appointment.;Long Term Goal: Adherence to prescribed nutrition plan.             Nutrition Assessments:  MEDIFICTS Score Key: ?70 Need to make dietary changes  40-70 Heart Healthy Diet ? 40 Therapeutic Level Cholesterol Diet  Flowsheet Row Pulmonary Rehab from 02/03/2021 in [facility]  Picture Your Plate Total Score on Admission 51      Picture Your Plate Scores: <67 Unhealthy dietary pattern with much room for improvement. 41-50 Dietary pattern unlikely to meet recommendations for good health and room for improvement. 51-60 More healthful dietary pattern, with some room for improvement.  >60 Healthy dietary pattern, although there may be some specific behaviors that could be improved.   Nutrition Goals Re-Evaluation:  Nutrition Goals Re-Evaluation     [place] Name 03/04/21 0734 04/01/21 0737 04/29/21 0733 06/03/21 0727 06/26/21 0749     Goals   Current Weight -- -- 212 lb (96.2 kg) -- 212 lb 3.2 oz (96.3 kg)   Nutrition Goal ST: add mid morning snakc to avoid probable low BG and intense hunger, check BG at least 1x/day. LT: maintain BG during the day, choose healthier options that follow MyPlate stucture when eating out for lunch. ST: add protein to mid morning snack to avoid probable low BG and intense hunger, check BG at least 1x/day. LT: maintain BG during the day, choose healthier options that follow MyPlate stucture when eating out for lunch. ST: add protein to mid morning snack to avoid probable low BG and intense hunger, check BG at least 1x/day. LT: maintain BG during the day, choose healthier options that follow MyPlate stucture when eating out for lunch. Short: Focus on getting more protein Long: Continue to find balance planning. Short: Focus on getting more protein Long: Continue to find balance planning.   Comment Mattia has not had a lot of appetite.  He is eating once a day.  He has not been checking his sugars like he should.  We  talked about adding in a protein shake to help. Continue to try to make a good options. Mckenzie has added a mid morning snack in the form of some fruit. He does not have a BG monitor at home. He will eat a peanut butter and jelly sandwich when he is hungry. Is not interested in protein shakes as he feels they are for old people, but is willing to try protein bars or soymilk or nuts and seeds with fruit. He reports his appetite continues to stay low. Linc has been losing some weight since just before rehab. He is at 212 lb today.  He has not really been focusing on protein so we talked about it again and the importance of why he needs it.  He is still only eating one meal so we talked about using a protein drink. Mister was gone on vacation last week and did not stick to his diet. He was eating lots of vegetables.  We talked about focusing in on proteins again.  He eats beans at least three times a week and chicken most other days. He is planning to get back to to his diet this week. Sheldon plans to continue to follow nutrition guidelines upon graduation from pulmonary rehab.   Expected Outcome Short: Try to eat more frequently and try protein shake Long: Continue to make healthy options. ST: add protein to mid morning snack to avoid probable low BG and intense hunger, check BG at least 1x/day. LT: maintain BG during the day, choose healthier options that follow MyPlate stucture when eating out for lunch. Short: Focus on getting more protein Long: Continue to find balance planning. Short: Get back to diet Long: Continue to  add in more protein. Short: Get back to diet Long: Continue to add in more protein. Maintain healthy eating upon graduation from program.            Nutrition Goals Discharge (Final Nutrition Goals Re-Evaluation):  Nutrition Goals Re-Evaluation - 06/26/21 0749       Goals   Current Weight 212 lb 3.2 oz (96.3 kg)    Nutrition Goal Short: Focus on getting more protein Long: Continue  to find balance planning.    Comment Shalom plans to continue to follow nutrition guidelines upon graduation from pulmonary rehab.    Expected Outcome Short: Get back to diet Long: Continue to add in more protein. Maintain healthy eating upon graduation from program.             Psychosocial: Target Goals: Acknowledge presence or absence of significant depression and/or stress, maximize coping skills, provide positive support system. Participant is able to verbalize types and ability to use techniques and skills needed for reducing stress and depression.   Education: Stress, Anxiety, and Depression - Group verbal and visual presentation to define topics covered.  Reviews how body is impacted by stress, anxiety, and depression.  Also discusses healthy ways to reduce stress and to treat/manage anxiety and depression.  Written material given at graduation.   Education: Sleep Hygiene -Provides group verbal and written instruction about how sleep can affect your health.  Define sleep hygiene, discuss sleep cycles and impact of sleep habits. Review good sleep hygiene tips.    Initial Review & Psychosocial Screening:  Initial Psych Review & Screening - 01/24/21 1541       Initial Review   Current issues with Current Stress Concerns    Source of Stress Concerns Family    Comments children stress him. 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      Family Dynamics   Good Support System? Yes   2 daughters, one son (one in [place] calls everyday other in [place]) and a sister.  .     Barriers   Psychosocial barriers to participate in program There are no identifiable barriers or psychosocial needs.;The patient should benefit from training in stress management and relaxation.      Screening Interventions   Interventions Provide feedback about the scores to participant;To provide support and resources with identified psychosocial needs;Encouraged to exercise    Expected  Outcomes Short Term goal: Utilizing psychosocial counselor, staff and physician to assist with identification of specific Stressors or current issues interfering with healing process. Setting desired goal for each stressor or current issue identified.;Long Term Goal: Stressors or current issues are controlled or eliminated.;Short Term goal: Identification and review with participant of any Quality of Life or Depression concerns found by scoring the questionnaire.;Long Term goal: The participant improves quality of Life and PHQ9 Scores as seen by post scores and/or verbalization of changes             Quality of Life Scores:  Scores of 19 and below usually indicate a poorer quality of life in these areas.  A difference of  2-3 points is a clinically meaningful difference.  A difference of 2-3 points in the total score of the Quality of Life Index has been associated with significant improvement in overall quality of life, self-image, physical symptoms, and general health in studies assessing change in quality of life.  PHQ-9: Recent Review Flowsheet Data     Depression screen [facility] 2/9 07/01/2021 06/03/2021 04/29/2021 04/01/2021 03/04/2021  Decreased Interest _0 Down, Depressed, Hopeless 0 _1 PHQ - 2 Score _2 Altered sleeping _3 Tired, decreased energy _4 Change in appetite _5 Feeling bad or failure about yourself  0 0 0 0 0   Trouble concentrating _6 0 0   Moving slowly or fidgety/restless 0 0 0 0 0   Suicidal thoughts 0 0 0 0 0   PHQ-9 Score _7 Difficult doing work/chores Somewhat difficult Somewhat difficult Not difficult at all Not difficult at all Not difficult at all      Interpretation of Total Score  Total Score Depression Severity:  1-4 = Minimal depression, 5-9 = Mild depression, 10-14 = Moderate depression, 15-19 = Moderately severe depression, 20-27 = Severe depression   Psychosocial Evaluation and  Intervention:  Psychosocial Evaluation - 01/24/21 1601       Psychosocial Evaluation & Interventions   Interventions Encouraged to exercise with the program and follow exercise prescription    Comments Guillermo has no barriers to attending the program. He does travel for work and may need to flex his schedule. He lives alone and has children in the area and a girlfriend. He does get stressed and has a psychiatrist at the [place] that he talks to. He is a smoker and is trting to quit. HIs support poepl are his family. He wants to improve his breathing,have more energy and quit smoking.    Expected Outcomes STG Denson is able to attend at least 2 days aweek depite his work schedule. He is able to Quit tobacco. LTG: Brandy has quit and maintains tobacco cessation, he is able to continue any progress he made during the program.    Continue Psychosocial Services  Follow up required by staff             Psychosocial Re-Evaluation:  Psychosocial Re-Evaluation     [place] Name 03/04/21 0725 04/01/21 0756 04/29/21 0726 06/03/21 0730 06/26/21 0751     Psychosocial Re-Evaluation   Current issues with Current Depression;Current Sleep Concerns;History of Depression;Current Stress Concerns Current Depression;Current Sleep Concerns;History of Depression;Current Stress Concerns;Current Psychotropic Meds Current Depression;Current Sleep Concerns;History of Depression;Current Stress Concerns;Current Psychotropic Meds Current Depression;Current Sleep Concerns;History of Depression;Current Stress Concerns;Current Psychotropic Meds Current Depression;Current Sleep Concerns;History of Depression;Current Stress Concerns;Current Psychotropic Meds   Comments Gildo is doing well in rehab.  We did his PHQ test again and his scores are better, but still feeling depressed.  He is seeing the pyschiatrist at [place].  He has nights where he doesn't sleep well and that has been going on for more than 6 months but he has not talked to  doctor about any of this. He was encouraged to talk with his doctor to help with sleep. His lack of sleep is making him feel tired. He reports being on medication for anxiety/depression - not sure what it is for exactly or what the medication is - he takes it daily. He sees a psychiatrist who may want to change the medication. He sees a therapist 1x/3 months - he says he cant get in any more frequently due to their schedule; discussed how it would benefit to find a practice he could see more often. His daughter is causing him stress - she always wants something from him. He  also finds work depressing. He enjoys working on his yard, but it had been too hard. He also enjoys golfing. He has a smaller appetite and has had for 6-8 months (unsure why). HE has trouble falling asleep and staying asleep - will stay asleep for 1 hour at a time. His MD is going to prescibe him sleep medication; he denies racing thoughts at night and is not sure why he can't sleep. Discussed different kinds of meditation and suggested trying some out. Shawnee is doing well in rehab.  He redid his PHQ today and it had gone up 3 points from last time.  He continues to have some stress from his daughter. He is taking his antidepressants and anxiety pills.  He finds work stressful too.  He stays in the house and sleeps all weekend.  He does have an appointment with psychiastrist on 10/9.  His PTSD is also interferring with his sleep as he keeps waking and then not able to get back to sleep.  He does have sleep pills.  He is going to theapist every three months.  He is not open to going with online platform.  We talked about trying sleep pills on weekends to see if they help. Primitivo continues to struggle with sleep.  He is afraid of over sleeping with it so we talked about taking it earlier to not stress over it.  He did enjoy vacation last week.  He continues to feel like his meds are working but that they may need some tweaking.  He has a follow up  on Thursday.  His PHQ is down two more points!  He is going to talk to his doctor some more this week. Isiaih continue to struggle with sleep. His doctor has given him a new depression and anxiety mediation that Jia states has been helping his mood a little better. He still struggles with consistent sleep patterns.   Expected Outcomes SHort: Talk to doctor about sleep and depression Long: Continue to find the positive. SHort: see how sleep medication helps, see a therapist more frequently, try meditation techniques  Long: Continue to find the positive. Short: Try sleep med on weekend Long: Continue to try to exercise more to sleep better. Short: Talk to doctor on Thursday about depression meds  Long: Continue to improve sleeping habits. Continue to work with doctor upon graduation to maintain good mental health habits and control depression and anxiety. Continue to work on improving sleep habits.   Interventions Stress management education;Encouraged to attend Cardiac Rehabilitation for the exercise Stress management education;Relaxation education;Encouraged to attend Pulmonary Rehabilitation for the exercise Stress management education;Relaxation education;Encouraged to attend Pulmonary Rehabilitation for the exercise -- --   Continue Psychosocial Services  Follow up required by staff Follow up required by staff Follow up required by staff -- --     Initial Review   Source of Stress Concerns -- Family;Occupation -- -- --            Psychosocial Discharge (Final Psychosocial Re-Evaluation):  Psychosocial Re-Evaluation - 06/26/21 0751       Psychosocial Re-Evaluation   Current issues with Current Depression;Current Sleep Concerns;History of Depression;Current Stress Concerns;Current Psychotropic Meds    Comments Gokul continue to struggle with sleep. His doctor has given him a new depression and anxiety mediation that Hari states has been helping his mood a little better. He still struggles  with consistent sleep patterns.    Expected Outcomes Continue to work with doctor upon graduation to maintain good  mental health habits and control depression and anxiety. Continue to work on improving sleep habits.             Education: Education Goals: Education classes will be provided on a weekly basis, covering required topics. Participant will state understanding/return demonstration of topics presented.  Learning Barriers/Preferences:  Learning Barriers/Preferences - 01/24/21 1546       Learning Barriers/Preferences   Learning Barriers None    Learning Preferences None             General Pulmonary Education Topics:  Infection Prevention: - Provides verbal and written material to individual with discussion of infection control including proper hand washing and proper equipment cleaning during exercise session. Flowsheet Row Pulmonary Rehab from 04/17/2021 in [facility] Cardiac and Pulmonary Rehab  Date 02/03/21  Educator [facility]  Instruction Review Code 1- Verbalizes Understanding       Falls Prevention: - Provides verbal and written material to individual with discussion of falls prevention and safety. Flowsheet Row Pulmonary Rehab from 04/17/2021 in [facility] Cardiac and Pulmonary Rehab  Date 02/03/21  Educator [facility]  Instruction Review Code 1- Verbalizes Understanding       Chronic Lung Disease Review: - Group verbal instruction with posters, models, PowerPoint presentations and videos,  to review new updates, new respiratory medications, new advancements in procedures and treatments. Providing information on websites and "800" numbers for continued self-education. Includes information about supplement oxygen, available portable oxygen systems, continuous and intermittent flow rates, oxygen safety, concentrators, and Medicare reimbursement for oxygen. Explanation of Pulmonary Drugs, including class, frequency, complications, importance of spacers, rinsing mouth after steroid  MDI's, and proper cleaning methods for nebulizers. Review of basic lung anatomy and physiology related to function, structure, and complications of lung disease. Review of risk factors. Discussion about methods for diagnosing sleep apnea and types of masks and machines for OSA. Includes a review of the use of types of environmental controls: home humidity, furnaces, filters, dust mite/pet prevention, HEPA vacuums. Discussion about weather changes, air quality and the benefits of nasal washing. Instruction on Warning signs, infection symptoms, calling MD promptly, preventive modes, and value of vaccinations. Review of effective airway clearance, coughing and/or vibration techniques. Emphasizing that all should Create an Action Plan. Written material given at graduation. Flowsheet Row Pulmonary Rehab from 04/17/2021 in [facility] Cardiac and Pulmonary Rehab  Education need identified 02/03/21       AED/CPR: - Group verbal and written instruction with the use of models to demonstrate the basic use of the AED with the basic ABC's of resuscitation.    Anatomy and Cardiac Procedures: - Group verbal and visual presentation and models provide information about basic cardiac anatomy and function. Reviews the testing methods done to diagnose heart disease and the outcomes of the test results. Describes the treatment choices: Medical Management, Angioplasty, or Coronary Bypass Surgery for treating various heart conditions including Myocardial Infarction, Angina, Valve Disease, and Cardiac Arrhythmias.  Written material given at graduation. Flowsheet Row Pulmonary Rehab from 04/17/2021 in [facility] Cardiac and Pulmonary Rehab  Date 04/17/21  Educator [facility]  Instruction Review Code 1- Verbalizes Understanding       Medication Safety: - Group verbal and visual instruction to review commonly prescribed medications for heart and lung disease. Reviews the medication, class of the drug, and side effects. Includes the steps to  properly store meds and maintain the prescription regimen.  Written material given at graduation. Flowsheet Row Pulmonary Rehab from 04/17/2021 in [facility] Cardiac and Pulmonary Rehab  Date 03/06/21  Educator [facility]  Instruction Review Code 1- Verbalizes Understanding       Other: -Provides group and verbal instruction on various topics (see comments)   Knowledge Questionnaire Score:  Knowledge Questionnaire Score - 02/03/21 1323       Knowledge Questionnaire Score   Pre Score 13/16 Education Focus: Chonic Lung Disease, O2 safety              Core Components/Risk Factors/Patient Goals at Admission:  Personal Goals and Risk Factors at Admission - 02/03/21 1323       Core Components/Risk Factors/Patient Goals on Admission    Weight Management Yes;Weight Maintenance;Obesity    Intervention Weight Management: Provide education and appropriate resources to help participant work on and attain dietary goals.;Weight Management: Develop a combined nutrition and exercise program designed to reach desired caloric intake, while maintaining appropriate intake of nutrient and fiber, sodium and fats, and appropriate energy expenditure required for the weight goal.;Weight Management/Obesity: Establish reasonable short term and long term weight goals.;Obesity: Provide education and appropriate resources to help participant work on and attain dietary goals.    Admit Weight 213 lb 9.6 oz (96.9 kg)    Goal Weight: Short Term 208 lb (94.3 kg)    Goal Weight: Long Term 200 lb (90.7 kg)    Expected Outcomes Short Term: Continue to assess and modify interventions until short term weight is achieved;Long Term: Adherence to nutrition and physical activity/exercise program aimed toward attainment of established weight goal;Weight Loss: Understanding of general recommendations for a balanced deficit meal plan, which promotes 1-2 lb weight loss per week and includes a negative energy balance of [phone]  kcal/d;Understanding recommendations for meals to include 15-35% energy as protein, 25-35% energy from fat, 35-60% energy from carbohydrates, less than 286m of dietary cholesterol, 20-35 gm of total fiber daily;Understanding of distribution of calorie intake throughout the day with the consumption of 4-5 meals/snacks    Tobacco Cessation Yes    Number of packs per day 1 per day    Intervention Assist the participant in steps to quit. Provide individualized education and counseling about committing to Tobacco Cessation, relapse prevention, and pharmacological support that can be provided by physician.;OAdvice worker assist with locating and accessing local/national Quit Smoking programs, and support quit date choice.    Expected Outcomes Short Term: Will demonstrate readiness to quit, by selecting a quit date.;Short Term: Will quit all tobacco product use, adhering to prevention of relapse plan.;Long Term: Complete abstinence from all tobacco products for at least 12 months from quit date.    Improve shortness of breath with ADL's Yes    Intervention Provide education, individualized exercise plan and daily activity instruction to help decrease symptoms of SOB with activities of daily living.    Expected Outcomes Short Term: Improve cardiorespiratory fitness to achieve a reduction of symptoms when performing ADLs;Long Term: Be able to perform more ADLs without symptoms or delay the onset of symptoms    Increase knowledge of respiratory medications and ability to use respiratory devices properly  Yes    Intervention Provide education and demonstration as needed of appropriate use of medications, inhalers, and oxygen therapy.    Expected Outcomes Short Term: Achieves understanding of medications use. Understands that oxygen is a medication prescribed by physician. Demonstrates appropriate use of inhaler and oxygen therapy.;Long Term: Maintain appropriate use of medications, inhalers, and  oxygen therapy.    Diabetes Yes    Intervention Provide education about signs/symptoms and action to take for hypo/hyperglycemia.;Provide education about  proper nutrition, including hydration, and aerobic/resistive exercise prescription along with prescribed medications to achieve blood glucose in normal ranges: Fasting glucose 65-99 mg/dL    Expected Outcomes Short Term: Participant verbalizes understanding of the signs/symptoms and immediate care of hyper/hypoglycemia, proper foot care and importance of medication, aerobic/resistive exercise and nutrition plan for blood glucose control.;Long Term: Attainment of HbA1C < 7%.    Hypertension Yes    Intervention Provide education on lifestyle modifcations including regular physical activity/exercise, weight management, moderate sodium restriction and increased consumption of fresh fruit, vegetables, and low fat dairy, alcohol moderation, and smoking cessation.;Monitor prescription use compliance.    Expected Outcomes Short Term: Continued assessment and intervention until BP is < 140/69m HG in hypertensive participants. < 130/818mHG in hypertensive participants with diabetes, heart failure or chronic kidney disease.;Long Term: Maintenance of blood pressure at goal levels.    Lipids Yes    Intervention Provide education and support for participant on nutrition & aerobic/resistive exercise along with prescribed medications to achieve LDL <7043mHDL >78m22m  Expected Outcomes Short Term: Participant states understanding of desired cholesterol values and is compliant with medications prescribed. Participant is following exercise prescription and nutrition guidelines.;Long Term: Cholesterol controlled with medications as prescribed, with individualized exercise RX and with personalized nutrition plan. Value goals: LDL < 70mg21mL > 40 mg.             Education:Diabetes - Individual verbal and written instruction to review signs/symptoms of diabetes,  desired ranges of glucose level fasting, after meals and with exercise. Acknowledge that pre and post exercise glucose checks will be done for 3 sessions at entry of program. Flowsheet Row [facility]monary [facility]  Date 02/03/21  Educator [facility] Review Code 1- Verbalizes Understanding       Know Your Numbers and Heart Failure: - Group verbal and visual instruction to discuss disease risk factors for cardiac and pulmonary disease and treatment options.  Reviews associated critical values for Overweight/Obesity, Hypertension, Cholesterol, and Diabetes.  Discusses basics of heart failure: signs/symptoms and treatments.  Introduces Heart Failure Zone chart for action plan for heart failure.  Written material given at graduation.   Core Components/Risk Factors/Patient Goals Review:   Goals and Risk Factor Review     Row Name 02/03/21 1326 03/04/21 0729 04/01/21 0740 04/29/21 0737 06/03/21 0722     Core Components/Risk Factors/Patient Goals Review   Personal Goals Review Tobacco Cessation Tobacco Cessation;Weight Management/Obesity;Increase knowledge of respiratory medications and ability to use respiratory devices properly.;Hypertension;Diabetes;Improve shortness of breath with ADL's Tobacco Cessation;Weight Management/Obesity;Hypertension;Diabetes;Improve shortness of breath with ADL's Tobacco Cessation;Weight Management/Obesity;Hypertension;Diabetes;Improve shortness of breath with ADL's;Increase knowledge of respiratory medications and ability to use respiratory devices properly. Tobacco Cessation;Weight Management/Obesity;Hypertension;Diabetes;Improve shortness of breath with ADL's;Increase knowledge of respiratory medications and ability to use respiratory devices properly.   Review MelviLeny current tobacco user. Intervention for tobacco cessation was provided at the initial medical review. He was asked about readiness to quit and reported that  he is considering it and has cut back already . Patient was advised and educated about tobacco cessation using combination therapy, tobacco cessation classes, quit line, and quit smoking apps. Patient demonstrated understanding of this material but declined hand outs at this time. Staff will continue to provide encouragement and follow up with the patient throughout the program. MelviHarlisoing well in rehab.  He is still smoking a pack a day and not leaving the house on the  weekends.  He was encouraged to call his doctor about the depression. He is not really intersted in quitting yet.  His pressures are still all over the place and he only checks them occassionally at home.  We talked about getting into habit of checking it at home to track it better.  He has not been checking his sugars as he does not feel like doing anything at home.  He is doing well with his meds. His chest has been feeling tight which does not help his breathing. BP 122/60 today - he checks his bp at home - he does not recall how it usually runs, but last night bp 156/70. He is taking his medications as prescribed and he is having no issues. He does not check his BG at home and is not interested in doing so. He practices PLB and feels that it helps, but his overall breathing has stayed the same. He is interested in quitting smoking, but does not have a quit date and would like to think about one. He reports he has lost weight since starting. Tanor's weight has been holding steady.  His pressures are doing well and he is still checking them.  His sugars are doing well.  He is only eating one meal still and not exercising so we talked about how exercise can help with weightloss too.  He is doing better with his breathing and has no problems with his meds.  He continues to smoke but is down to 1-2 a day Braheem is doing well in rehab.  His weight was up some today after vacation last week.  He is still around 10 cigarettes a day last week.  He  continues to work towards quitting.  He was able to walk and get around the city better than before with less SOB.  Meds are still working well.  His pressures continue to do well in class as he does not check it at home. His sugars feel okay, but he has not been checking them.   Expected Outcomes Short: Continue to reduce number of cigarettes per day Long: Set a quit date Short: Call doctor about depression and check sugars and pressures LOng: Continue to work on a routine to help with depression. Short: Continue to check BP at home, find his BG monitor, decide on a smoking quit date Long: quit smoking short: More protein Long: Conitnue to focus sleep LOng: Continue to work toward quitting. Short: Get weight back down Long: Continue to work toward cessation.    [place] Name 06/26/21 0753             Core Components/Risk Factors/Patient Goals Review   Personal Goals Review Tobacco Cessation;Weight Management/Obesity;Hypertension;Diabetes;Improve shortness of breath with ADL's;Increase knowledge of respiratory medications and ability to use respiratory devices properly.       Review [place] continues to take all medications as prescribed to help control risk factors. Upon graduation he will continue to monitor risk factors and control them with medication and continued exercise and dietary changes.       Expected Outcomes Short: graduate from pulmonary rehab. Long: Continue lifestyle changes learned in program.                Core Components/Risk Factors/Patient Goals at Discharge (Final Review):   Goals and Risk Factor Review - 06/26/21 0753       Core Components/Risk Factors/Patient Goals Review   Personal Goals Review Tobacco Cessation;Weight Management/Obesity;Hypertension;Diabetes;Improve shortness of breath with ADL's;Increase knowledge of respiratory medications  and ability to use respiratory devices properly.    Review Burdell continues to take all medications as prescribed to help control  risk factors. Upon graduation he will continue to monitor risk factors and control them with medication and continued exercise and dietary changes.    Expected Outcomes Short: graduate from pulmonary rehab. Long: Continue lifestyle changes learned in program.             ITP Comments:  ITP Comments     Row Name 01/24/21 1610 02/03/21 1152 02/04/21 0717 02/05/21 1022 02/26/21 1154   ITP Comments Virtual orientation call completed today. he has an appointment on Date: 02/03/2021  for EP eval and gym Orientation.  Documentation of diagnosis can be found in Media Tab Date: 02/03/2021 adding [facility] notees to Media Tab . Completed 6MWT and gym orientation. Initial ITP created and sent for review to Dr. Zetta Bills, Medical Director. First full day of exercise!  Patient was oriented to gym and equipment including functions, settings, policies, and procedures.  Patient's individual exercise prescription and treatment plan were reviewed.  All starting workloads were established based on the results of the 6 minute walk test done at initial orientation visit.  The plan for exercise progression was also introduced and progression will be customized based on patient's performance and goals. Completed initial RD consultation 30 Day review completed. Medical Director ITP review done, changes made as directed, and signed approval by Medical Director.    [place] Name 03/26/21 0820 04/23/21 0753 05/21/21 1421 06/18/21 0813 07/16/21 0750   ITP Comments 30 Day review completed. Medical Director ITP review done, changes made as directed, and signed approval by Medical Director. 30 Day review completed. Medical Director ITP review done, changes made as directed, and signed approval by Medical Director. 30 day review completed. ITP sent to Dr. Zetta Bills, Medical Director of  Pulmonary Rehab. Continue with ITP unless changes are made by physician. 30 Day review completed. Medical Director ITP review done, changes made as  directed, and signed approval by Medical Director. 30 Day review completed. Medical Director ITP review done, changes made as directed, and signed approval by Medical Director.            Comments:

## 2021-07-17 ENCOUNTER — Encounter: Payer: No Typology Code available for payment source | Attending: Pulmonary Disease

## 2021-07-17 DIAGNOSIS — J449 Chronic obstructive pulmonary disease, unspecified: Secondary | ICD-10-CM | POA: Insufficient documentation

## 2021-07-22 ENCOUNTER — Encounter: Payer: No Typology Code available for payment source | Admitting: *Deleted

## 2021-07-22 ENCOUNTER — Other Ambulatory Visit: Payer: Self-pay

## 2021-07-22 DIAGNOSIS — J449 Chronic obstructive pulmonary disease, unspecified: Secondary | ICD-10-CM

## 2021-07-22 NOTE — Progress Notes (Signed)
Daily Session Note  Patient Details  Name: FRANKIE SCIPIO MRN: [id number] Date of Birth: [birth year] Referring Provider:   [name] Pul[facility] Rehab from 02/03/2021 in [facility] Cardiac [facility] Pul[facility] Re[facility]  Referring Provider Danton Sewer MD  [facility]       Encounter Date: 07/22/2021  Check In:  Session Check In - 07/22/21 0721       Check-In   Supervising physician immediately available to respond to emergencies See telemetry face sheet for immediately available ER MD    Location [facility]-Cardiac & Pulmonary Rehab    Staff Present Nyoka Cowden, RN, BSN, Kela Millin, BA, ACSM CEP, Exercise Physiologist;Jessica Panora, MA, RCEP, CCRP, [place], [place], ASCM CEP, Exercise Physiologist    Virtual Visit No    Medication changes reported     No    Fall or balance concerns reported    No    Comments no pain in knee, fully functional    Warm-up and Cool-down Performed on first and last piece of equipment    Resistance Training Performed Yes    VAD Patient? No    PAD/SET Patient? No      Pain Assessment   Currently in Pain? No/denies                Social History   Tobacco Use  Smoking Status Every Day   Packs/day: 1.00   Years: 25.00   Pack years: 25.00   Types: Cigarettes   Last attempt to quit: 07/11/2020   Years since quitting: 1.0  Smokeless Tobacco Never  Tobacco Comments   02/03/21 currently at 1 ppd, [facility] is [facility]    Goals Met:  Independence with exercise equipment Exercise tolerated well No report of concerns or symptoms today  Goals Unmet:  O2 Sat  Comments: Pt able to follow exercise prescription today without complaint.  Will continue to monitor for progression.    Dr. Emily Filbert is Medical Director for [place].  Dr. Ottie Glazier is Medical Director for [facility] Pulmonary Rehabilitation.

## 2021-07-22 NOTE — Progress Notes (Signed)
Discharge Progress Report  Patient Details  Name: Robert Combs MRN: [id number] Date of Birth: 1946/05/08 Referring Provider:   [name] Pulmonary Rehab from 02/03/2021 in [facility] Cardiac and Pulmonary Rehab  Referring Provider Danton Sewer MD  [facility]        Number of Visits: 36  Reason for Discharge:  Patient reached a stable level of exercise. Patient independent in their exercise. Patient has met program and personal goals.  Smoking History:  Social History   Tobacco Use  Smoking Status Every Day   Packs/day: 1.00   Years: 25.00   Pack years: 25.00   Types: Cigarettes   Last attempt to quit: 07/11/2020   Years since quitting: 1.0  Smokeless Tobacco Never  Tobacco Comments   02/03/21 currently at 1 ppd, [facility] and [facility] is [facility] it    Diagnosis:  Chronic obstructive pulmonary disease, unspecified COPD type ([place])  ADL UCSD:  Pulmonary Assessment Scores     Row Name 02/03/21 1328 06/26/21 0959       ADL UCSD   ADL Phase Entry Exit    SOB Score total 57 --    Rest 3 --    Walk 4 --    Stairs 5 --    Bath 0 --    Dress 0 --    Shop 2 --      CAT Score   CAT Score 37 --      mMRC Score   mMRC Score 0 0             Initial Exercise Prescription:  Initial Exercise Prescription - 02/03/21 1300       Date of Initial Exercise RX and Referring Provider   Date 02/03/21    Referring Provider Danton Sewer MD   [facility]     Treadmill   MPH 2.2    Grade 0.5    Minutes 15    METs 2.84      Recumbant Bike   Level 2    RPM 50    Watts 22    Minutes 15    METs 2.6      Biostep-RELP   Level 2    SPM 50    Minutes 15    METs 2      Track   Laps 30    Minutes 15    METs 2.6      Prescription Details   Frequency (times per week) 2    Duration Progress to 30 minutes of continuous aerobic without signs/symptoms of physical distress      Intensity   THRR 40-80% of Max Heartrate 95-130    Ratings of  Perceived Exertion 11-13    Perceived Dyspnea 0-4      Progression   Progression Continue to progress workloads to maintain intensity without signs/symptoms of physical distress.      Resistance Training   Training Prescription Yes    Weight 4 lb    Reps 10-15             Discharge Exercise Prescription (Final Exercise Prescription Changes):  Exercise Prescription Changes - 07/07/21 1500       Response to Exercise   Blood Pressure (Admit) 114/68    Blood Pressure (Exit) 118/62    Heart Rate (Admit) 57 bpm    Heart Rate (Exercise) 82 bpm    Heart Rate (Exit) 83 bpm    Oxygen Saturation (Admit) 94 %    Oxygen Saturation (  Exercise) 92 %    Oxygen Saturation (Exit) 93 %    Rating of Perceived Exertion (Exercise) 13    Perceived Dyspnea (Exercise) 1    Symptoms none    Duration Continue with 30 min of aerobic exercise without signs/symptoms of physical distress.    Intensity THRR unchanged      Progression   Progression Continue to progress workloads to maintain intensity without signs/symptoms of physical distress.    Average METs 2.53      Resistance Training   Training Prescription Yes    Weight 7 lb    Reps 10-15      Interval Training   Interval Training No      Recumbant Bike   Level 4    Minutes 15    METs 3.8      NuStep   Level 5    Minutes 15    METs 2.2      Biostep-RELP   Level 5    Minutes 15    METs 2      Track   Laps 21    Minutes 15    METs 2.14      Home Exercise Plan   Plans to continue exercise at Home (comment)   walking   Frequency Add 2 additional days to program exercise sessions.    Initial Home Exercises Provided 03/04/21      Oxygen   Maintain Oxygen Saturation 88% or higher             Functional Capacity:  6 Minute Walk     Row Name 02/03/21 1152 06/26/21 0737       6 Minute Walk   Phase Initial Discharge    Distance 1380 feet 1440 feet    Distance % Change -- 4 %    Distance Feet Change -- 60 ft     Walk Time 6 minutes 6 minutes    # of Rest Breaks 0 0    MPH 2.61 2.73    METS 2.72 2.45    RPE 11 13    Perceived Dyspnea  3 0    VO2 Peak 9.51 8.58    Symptoms Yes (comment) No    Comments legs burning, SOB --    Resting HR 60 bpm 53 bpm    Resting BP 122/62 128/70    Resting Oxygen Saturation  94 % 92 %    Exercise Oxygen Saturation  during 6 min walk 91 % 89 %    Max Ex. HR 83 bpm 86 bpm    Max Ex. BP 156/74 140/70    2 Minute Post BP 132/70 124/62      Interval HR   1 Minute HR 59 68    2 Minute HR 66 83    3 Minute HR 67 85    4 Minute HR 82 86    5 Minute HR 83 85    6 Minute HR 70 85    2 Minute Post HR 65 58    Interval Heart Rate? Yes Yes      Interval Oxygen   Interval Oxygen? Yes Yes    Baseline Oxygen Saturation % 94 % 92 %    1 Minute Oxygen Saturation % 93 % 93 %    1 Minute Liters of Oxygen 0 L  Room Air 0 L    2 Minute Oxygen Saturation % 92 % 91 %    2 Minute Liters of Oxygen 0 L 0 L  3 Minute Oxygen Saturation % 91 % 89 %    3 Minute Liters of Oxygen 0 L 0 L    4 Minute Oxygen Saturation % 92 % 90 %    4 Minute Liters of Oxygen 0 L 0 L    5 Minute Oxygen Saturation % 91 % 90 %    5 Minute Liters of Oxygen 0 L 0 L    6 Minute Oxygen Saturation % 91 % 90 %    6 Minute Liters of Oxygen 0 L 0 L    2 Minute Post Oxygen Saturation % 96 % 95 %    2 Minute Post Liters of Oxygen 0 L 0 L             Psychological, QOL, Others - Outcomes: PHQ 2/9: Depression screen [facility] 2/9 07/01/2021 06/03/2021 04/29/2021 04/01/2021 03/04/2021  Decreased Interest 1 2 2 1 1   Down, Depressed, Hopeless 0 1 2 2 1   PHQ - 2 Score 1 3 4 3 2   Altered sleeping 3 3 3 3 1   Tired, decreased energy 3 1 2 2 1   Change in appetite 1 2 2 2 3   Feeling bad or failure about yourself  0 0 0 0 0  Trouble concentrating 1 2 2  0 0  Moving slowly or fidgety/restless 0 0 0 0 0  Suicidal thoughts 0 0 0 0 0  PHQ-9 Score 9 11 13 10 7   Difficult doing work/chores Somewhat difficult Somewhat  difficult Not difficult at all Not difficult at all Not difficult at all       Nutrition & Weight - Outcomes:  Pre Biometrics - 02/03/21 1320       Pre Biometrics   Height 5' 10.1" (1.781 m)    Weight 213 lb 9.6 oz (96.9 kg)    BMI (Calculated) 30.55    Single Leg Stand 6.5 seconds             Post Biometrics - 06/26/21 3662        Post  Biometrics   Height 5' 10.1" (1.781 m)    Weight 213 lb 3.2 oz (96.7 kg)    BMI (Calculated) 30.49    Single Leg Stand 14.97 seconds             Nutrition:  Nutrition Therapy & Goals - 02/05/21 0948       Nutrition Therapy   Diet Heart healthy, low Na, diabetes friendly    Protein (specify units) 110-115g    Fiber 30 grams    Whole Grain Foods 3 servings    Saturated Fats 12 max. grams    Fruits and Vegetables 8 servings/day    Sodium 1.5 grams      Personal Nutrition Goals   Nutrition Goal ST: add mid morning snakc to avoid probable low BG and intense hunger, check BG at least 1x/day. LT: maintain BG during the day, choose healthier options that follow MyPlate stucture when eating out for lunch.    Comments He doesn't normally check his BG at home. His MD said his BG was good when he was last in per [facility]. He reports no shortness of breath during or after eating. He reports that his appetite is lower than used to be. 221lb--> 214lbs in 3 weeks and feels the difference in his clothes. He feels his appetite was the same over this time. B: weekend: bacon and egg sandwich (1/2) L: go out: vegetables and meat or sandwiches (hamburger, hot dog, bolonga sandwich)  D: cabbage and green beans, baked chicken, pork chop, corn, potatoes -baked, spaghetti with meat sauce and salad (not fond of pasta), he does not like rice, uses butter and olive oil, he reports not liking salt so she will use a little bit of salt - girlfriend cooks. He reports waiting until he gets "extremely hungry" to eat. He feels hunger when his legs get weak or he has  stomach pains. He feels it's hard for him to get balanced meal at work when he goes out for lunch. We will start with a mid morning snack to avoid extreme hunger and probable low BG and then move to modifying lunch. Discussed importance of checking BG - he reports having all the tools to check and what to do for low BG. Discussed heart healthy eaitng, diabetes friendly eating, and COPD friendly eating.      Intervention Plan   Intervention Prescribe, educate and counsel regarding individualized specific dietary modifications aiming towards targeted core components such as weight, hypertension, lipid management, diabetes, heart failure and other comorbidities.    Expected Outcomes Short Term Goal: Understand basic principles of dietary content, such as calories, fat, sodium, cholesterol and nutrients.;Short Term Goal: A plan has been developed with personal nutrition goals set during dietitian appointment.;Long Term Goal: Adherence to prescribed nutrition plan.             Goals reviewed with patient; copy given to patient.

## 2021-07-22 NOTE — Progress Notes (Signed)
Pulmonary Individual Treatment Plan  Patient Details  Name: Robert Combs MRN: [id number] Date of Birth: 03-13-1946 Referring Provider:   [name] Pulmonary Rehab from 02/03/2021 in [facility] Cardiac and Pulmonary Rehab  Referring Provider Danton Sewer MD  [facility]]       Initial Encounter Date:  Flowsheet Row Pulmonary Rehab from 02/03/2021 in [facility] Cardiac and Pulmonary Rehab  Date 02/03/21       Visit Diagnosis: Chronic obstructive pulmonary disease, unspecified COPD type ([place])  Patient's Home Medications on Admission:  Current Outpatient Medications:    albuterol (PROVENTIL HFA;VENTOLIN HFA) 108 (90 Base) MCG/ACT inhaler, Inhale 1 puff into the lungs every 6 (six) hours as needed for wheezing or shortness of breath., Disp: , Rfl:    albuterol (VENTOLIN HFA) 108 (90 Base) MCG/ACT inhaler, Inhale into the lungs., Disp: , Rfl:    Alogliptin Benzoate 25 MG TABS, Take by mouth., Disp: , Rfl:    Ascorbic Acid (VITAMIN C) 100 MG tablet, Take 100 mg by mouth daily. (Patient not taking: Reported on 01/24/2021), Disp: , Rfl:    aspirin EC 81 MG tablet, Take 81 mg by mouth daily.  , Disp: , Rfl:    budesonide-formoterol (SYMBICORT) 160-4.5 MCG/ACT inhaler, Inhale into the lungs., Disp: , Rfl:    cholecalciferol (VITAMIN D) 1000 UNITS tablet, Take 1,000 Units by mouth daily.  , Disp: , Rfl:    losartan (COZAAR) 50 MG tablet, Take 25 mg by mouth daily., Disp: , Rfl:    metFORMIN (GLUCOPHAGE) 500 MG tablet, Take 1 tablet (500 mg) PO daily x 1 week, then increase to 1 tablet PO BID thereafter, Disp: 60 tablet, Rfl: 0   mometasone (ASMANEX) 220 MCG/INH inhaler, Take by mouth., Disp: , Rfl:    omeprazole (PRILOSEC OTC) 20 MG tablet, Take 20 mg by mouth daily., Disp: , Rfl:    traMADol (ULTRAM) 50 MG tablet, Take 1 tablet (50 mg total) by mouth every 6 (six) hours as needed., Disp: 10 tablet, Rfl: 0  Past Medical History: Past Medical History:  Diagnosis Date   COPD (chronic  obstructive pulmonary disease) ([place])    Coronary artery disease    DDD (degenerative disc disease)    Hypertension     Tobacco Use: Social History   Tobacco Use  Smoking Status Every Day   Packs/day: 1.00   Years: 25.00   Pack years: 25.00   Types: Cigarettes   Last attempt to quit: 07/11/2020   Years since quitting: 1.0  Smokeless Tobacco Never  Tobacco Comments   02/03/21 currently at 1 ppd, [facility] and [facility] is [facility]    Labs: Recent Review Flowsheet Data     Labs for ITP Cardiac and Pulmonary Rehab Latest Ref Rng & Units 02/03/2010 11/04/2015   HCO3 20.0 - 24.0 mEq/L - 26.3(H)   TCO2 0 - 100 mmol/L 31 28   O2SAT % - 75.0        Pulmonary Assessment Scores:  Pulmonary Assessment Scores     Row Name 02/03/21 1328 06/26/21 0959       ADL UCSD   ADL Phase Entry Exit    SOB Score total 57 --    Rest 3 --    Walk 4 --    Stairs 5 --    Bath 0 --    Dress 0 --    Shop 2 --      CAT Score   CAT Score 37 --  mMRC Score   mMRC Score 0 0             UCSD: Self-administered rating of dyspnea associated with activities of daily living (ADLs) 6-point scale (0 = "not at all" to 5 = "maximal or unable to do because of breathlessness")  Scoring Scores range from 0 to 120.  Minimally important difference is 5 units  CAT: CAT can identify the health impairment of COPD patients and is better correlated with disease progression.  CAT has a scoring range of zero to 40. The CAT score is classified into four groups of low (less than 10), medium (10 - 20), high (21-30) and very high (31-40) based on the impact level of disease on health status. A CAT score over 10 suggests significant symptoms.  A worsening CAT score could be explained by an exacerbation, poor medication adherence, poor inhaler technique, or progression of COPD or comorbid conditions.  CAT MCID is 2 points  mMRC: mMRC (Modified Medical Research Council) Dyspnea Scale is used to  assess the degree of baseline functional disability in patients of respiratory disease due to dyspnea. No minimal important difference is established. A decrease in score of 1 point or greater is considered a positive change.   Pulmonary Function Assessment:  Pulmonary Function Assessment - 02/03/21 1327       Breath   Shortness of Breath Yes;Fear of Shortness of Breath;Limiting activity             Exercise Target Goals: Exercise Program Goal: Individual exercise prescription set using results from initial 6 min walk test and THRR while considering  patient's activity barriers and safety.   Exercise Prescription Goal: Initial exercise prescription builds to 30-45 minutes a day of aerobic activity, 2-3 days per week.  Home exercise guidelines will be given to patient during program as part of exercise prescription that the participant will acknowledge.  Education: Aerobic Exercise: - Group verbal and visual presentation on the components of exercise prescription. Introduces F.I.T.T principle from ACSM for exercise prescriptions.  Reviews F.I.T.T. principles of aerobic exercise including progression. Written material given at graduation. Flowsheet Row Pulmonary Rehab from 04/17/2021 in [facility] Cardiac and Pulmonary Rehab  Date 02/06/21  Educator [facility]  Instruction Review Code 1- Verbalizes Understanding       Education: Resistance Exercise: - Group verbal and visual presentation on the components of exercise prescription. Introduces F.I.T.T principle from ACSM for exercise prescriptions  Reviews F.I.T.T. principles of resistance exercise including progression. Written material given at graduation. Flowsheet Row Pulmonary Rehab from 04/17/2021 in [facility] Cardiac and Pulmonary Rehab  Date 04/17/21  Educator [facility]  Instruction Review Code 1- Verbalizes Understanding        Education: Exercise & Equipment Safety: - Individual verbal instruction and demonstration of equipment use and safety  with use of the equipment. Flowsheet Row Pulmonary Rehab from 04/17/2021 in [facility] Cardiac and Pulmonary Rehab  Date 02/03/21  Educator [facility]  Instruction Review Code 1- Verbalizes Understanding       Education: Exercise Physiology & General Exercise Guidelines: - Group verbal and written instruction with models to review the exercise physiology of the cardiovascular system and associated critical values. Provides general exercise guidelines with specific guidelines to those with heart or lung disease.    Education: Flexibility, Balance, Mind/Body Relaxation: - Group verbal and visual presentation with interactive activity on the components of exercise prescription. Introduces F.I.T.T principle from ACSM for exercise prescriptions. Reviews F.I.T.T. principles of flexibility and balance exercise training including progression. Also  discusses the mind body connection.  Reviews various relaxation techniques to help reduce and manage stress (i.e. Deep breathing, progressive muscle relaxation, and visualization). Balance handout provided to take home. Written material given at graduation. Flowsheet Row Pulmonary Rehab from 04/17/2021 in [facility] Cardiac and Pulmonary Rehab  Date 02/20/21  Educator [facility]  Instruction Review Code 1- Verbalizes Understanding       Activity Barriers & Risk Stratification:  Activity Barriers & Cardiac Risk Stratification - 02/03/21 1154       Activity Barriers & Cardiac Risk Stratification   Activity Barriers Shortness of Breath;Joint Problems;Deconditioning;Muscular Weakness;Balance Concerns             6 Minute Walk:  6 Minute Walk     Row Name 02/03/21 1152 06/26/21 0737       6 Minute Walk   Phase Initial Discharge    Distance 1380 feet 1440 feet    Distance % Change -- 4 %    Distance Feet Change -- 60 ft    Walk Time 6 minutes 6 minutes    # of Rest Breaks 0 0    MPH 2.61 2.73    METS 2.72 2.45    RPE 11 13    Perceived Dyspnea  3 0    VO2 Peak 9.51  8.58    Symptoms Yes (comment) No    Comments legs burning, SOB --    Resting HR 60 bpm 53 bpm    Resting BP 122/62 128/70    Resting Oxygen Saturation  94 % 92 %    Exercise Oxygen Saturation  during 6 min walk 91 % 89 %    Max Ex. HR 83 bpm 86 bpm    Max Ex. BP 156/74 140/70    2 Minute Post BP 132/70 124/62      Interval HR   1 Minute HR 59 68    2 Minute HR 66 83    3 Minute HR 67 85    4 Minute HR 82 86    5 Minute HR 83 85    6 Minute HR 70 85    2 Minute Post HR 65 58    Interval Heart Rate? Yes Yes      Interval Oxygen   Interval Oxygen? Yes Yes    Baseline Oxygen Saturation % 94 % 92 %    1 Minute Oxygen Saturation % 93 % 93 %    1 Minute Liters of Oxygen 0 L  Room Air 0 L    2 Minute Oxygen Saturation % 92 % 91 %    2 Minute Liters of Oxygen 0 L 0 L    3 Minute Oxygen Saturation % 91 % 89 %    3 Minute Liters of Oxygen 0 L 0 L    4 Minute Oxygen Saturation % 92 % 90 %    4 Minute Liters of Oxygen 0 L 0 L    5 Minute Oxygen Saturation % 91 % 90 %    5 Minute Liters of Oxygen 0 L 0 L    6 Minute Oxygen Saturation % 91 % 90 %    6 Minute Liters of Oxygen 0 L 0 L    2 Minute Post Oxygen Saturation % 96 % 95 %    2 Minute Post Liters of Oxygen 0 L 0 L            Oxygen Initial Assessment:  Oxygen Initial Assessment - 02/03/21 1327  Home Oxygen   Home Oxygen Device None    Sleep Oxygen Prescription None    Home Exercise Oxygen Prescription None    Home Resting Oxygen Prescription None      Initial 6 min Walk   Oxygen Used None      Program Oxygen Prescription   Program Oxygen Prescription None      Intervention   Short Term Goals To learn and demonstrate proper pursed lip breathing techniques or other breathing techniques. ;To learn and demonstrate proper use of respiratory medications;To learn and understand importance of maintaining oxygen saturations>88%;To learn and understand importance of monitoring SPO2 with pulse oximeter and demonstrate  accurate use of the pulse oximeter.    Long  Term Goals Exhibits proper breathing techniques, such as pursed lip breathing or other method taught during program session;Compliance with respiratory medication;Maintenance of O2 saturations>88%;Verbalizes importance of monitoring SPO2 with pulse oximeter and return demonstration             Oxygen Re-Evaluation:  Oxygen Re-Evaluation     Row Name 02/04/21 0718 03/04/21 0737 04/01/21 0733 04/29/21 0739 06/03/21 0738     Program Oxygen Prescription   Program Oxygen Prescription _0      Home Oxygen   Home Oxygen Device _1    Sleep Oxygen Prescription _2    Home Exercise Oxygen Prescription _3    Home Resting Oxygen Prescription _4    Compliance with Home Oxygen Use Yes -- Yes -- Yes     Goals/Expected Outcomes   Short Term Goals To learn and demonstrate proper pursed lip breathing techniques or other breathing techniques. ;To learn and understand importance of maintaining oxygen saturations>88%;To learn and understand importance of monitoring SPO2 with pulse oximeter and demonstrate accurate use of the pulse oximeter. To learn and demonstrate proper pursed lip breathing techniques or other breathing techniques. ;To learn and understand importance of maintaining oxygen saturations>88%;To learn and understand importance of monitoring SPO2 with pulse oximeter and demonstrate accurate use of the pulse oximeter. To learn and demonstrate proper pursed lip breathing techniques or other breathing techniques. ;To learn and understand importance of maintaining oxygen saturations>88%;To learn and understand importance of monitoring SPO2 with pulse oximeter and demonstrate accurate use of the pulse oximeter. To learn and understand importance of monitoring SPO2 with pulse oximeter and demonstrate accurate use of the pulse oximeter.;To learn and understand  importance of maintaining oxygen saturations>88%;To learn and demonstrate proper pursed lip breathing techniques or other breathing techniques. ;To learn and demonstrate proper use of respiratory medications To learn and understand importance of monitoring SPO2 with pulse oximeter and demonstrate accurate use of the pulse oximeter.;To learn and understand importance of maintaining oxygen saturations>88%;To learn and demonstrate proper pursed lip breathing techniques or other breathing techniques. ;To learn and demonstrate proper use of respiratory medications   Long  Term Goals Exhibits proper breathing techniques, such as pursed lip breathing or other method taught during program session;Compliance with respiratory medication;Maintenance of O2 saturations>88%;Verbalizes importance of monitoring SPO2 with pulse oximeter and return demonstration Exhibits proper breathing techniques, such as pursed lip breathing or other method taught during program session;Compliance with respiratory medication;Maintenance of O2 saturations>88%;Verbalizes importance of monitoring SPO2 with pulse oximeter and return demonstration Exhibits proper breathing techniques, such as pursed lip breathing or other method taught during program session;Compliance with respiratory medication;Maintenance of O2 saturations>88%;Verbalizes importance of monitoring SPO2 with pulse oximeter and return demonstration Verbalizes importance  of monitoring SPO2 with pulse oximeter and return demonstration;Maintenance of O2 saturations>88%;Exhibits proper breathing techniques, such as pursed lip breathing or other method taught during program session;Compliance with respiratory medication Verbalizes importance of monitoring SPO2 with pulse oximeter and return demonstration;Maintenance of O2 saturations>88%;Exhibits proper breathing techniques, such as pursed lip breathing or other method taught during program session;Compliance with respiratory medication    Comments Reviewed PLB technique with pt.  Talked about how it works and it's importance in maintaining their exercise saturations. Robert Combs is doing well in rehab. His breathing is getting better, but he is stil smoking and his chest feels tight.  We talked about quitting smoking to help. He does use his PLB with exercise. Robert Combs reports his breathing is about the time. He does not have a pulse oximeter at home, discussed importance of checking oxygen. He continues to practice PLB and feels that it helps. He is still smoking, he is smoking the same amount, but is interested in quitting. He has quit 4-5 times in 3 years, when he feels triggered he will start again. Robert Combs is doing well with rehab.  His breathing is getting better and monitors his sats at home.  He uses his PLB when he feels short of breath but not before then. Robert Combs is doing well in rehab.  His breathing continues to improve!  He was able to enjoy walking on his vacation last week.  He continues to work on his PLB. He does continue to smoke too.   Goals/Expected Outcomes Short: Become more profiecient at using PLB.   Long: Become independent at using PLB. short: Continue to work on breathing Long: Use PLB more frequently short: Think about a quit date Long: Use PLB more frequently short: Think about a quit date Long: Use PLB more frequently Short: Continue to exercise to improve breathing Long: Continue to use PLB for breath control.    [place] Name 06/26/21 0755             Program Oxygen Prescription   Program Oxygen Prescription None         Home Oxygen   Home Oxygen Device None       Sleep Oxygen Prescription None       Home Exercise Oxygen Prescription None       Home Resting Oxygen Prescription None       Compliance with Home Oxygen Use Yes         Goals/Expected Outcomes   Short Term Goals To learn and understand importance of monitoring SPO2 with pulse oximeter and demonstrate accurate use of the pulse oximeter.;To learn and  understand importance of maintaining oxygen saturations>88%;To learn and demonstrate proper pursed lip breathing techniques or other breathing techniques. ;To learn and demonstrate proper use of respiratory medications       Long  Term Goals Verbalizes importance of monitoring SPO2 with pulse oximeter and return demonstration;Maintenance of O2 saturations>88%;Exhibits proper breathing techniques, such as pursed lip breathing or other method taught during program session;Compliance with respiratory medication       Comments Robert Combs is getting ready to graduate from pulmonary rehab and will continue to exercise and use proper breathing techniques to control SOB and improve his ability to do ADL.       Goals/Expected Outcomes Short: Continue to exercise to improve breathing Long: Continue to use PLB for breath control.                Oxygen Discharge (Final Oxygen Re-Evaluation):  Oxygen Re-Evaluation - 06/26/21  0755       Program Oxygen Prescription   Program Oxygen Prescription None      Home Oxygen   Home Oxygen Device None    Sleep Oxygen Prescription None    Home Exercise Oxygen Prescription None    Home Resting Oxygen Prescription None    Compliance with Home Oxygen Use Yes      Goals/Expected Outcomes   Short Term Goals To learn and understand importance of monitoring SPO2 with pulse oximeter and demonstrate accurate use of the pulse oximeter.;To learn and understand importance of maintaining oxygen saturations>88%;To learn and demonstrate proper pursed lip breathing techniques or other breathing techniques. ;To learn and demonstrate proper use of respiratory medications    Long  Term Goals Verbalizes importance of monitoring SPO2 with pulse oximeter and return demonstration;Maintenance of O2 saturations>88%;Exhibits proper breathing techniques, such as pursed lip breathing or other method taught during program session;Compliance with respiratory medication    Comments Robert Combs is  getting ready to graduate from pulmonary rehab and will continue to exercise and use proper breathing techniques to control SOB and improve his ability to do ADL.    Goals/Expected Outcomes Short: Continue to exercise to improve breathing Long: Continue to use PLB for breath control.             Initial Exercise Prescription:  Initial Exercise Prescription - 02/03/21 1300       Date of Initial Exercise RX and Referring Provider   Date 02/03/21    Referring Provider Danton Sewer MD   [facility]     Treadmill   MPH 2.2    Grade 0.5    Minutes 15    METs 2.84      Recumbant Bike   Level 2    RPM 50    Watts 22    Minutes 15    METs 2.6      Biostep-RELP   Level 2    SPM 50    Minutes 15    METs 2      Track   Laps 30    Minutes 15    METs 2.6      Prescription Details   Frequency (times per week) 2    Duration Progress to 30 minutes of continuous aerobic without signs/symptoms of physical distress      Intensity   THRR 40-80% of Max Heartrate 95-130    Ratings of Perceived Exertion 11-13    Perceived Dyspnea 0-4      Progression   Progression Continue to progress workloads to maintain intensity without signs/symptoms of physical distress.      Resistance Training   Training Prescription Yes    Weight 4 lb    Reps 10-15             Perform Capillary Blood Glucose checks as needed.  Exercise Prescription Changes:   Exercise Prescription Changes     Row Name 02/03/21 1300 02/18/21 1500 03/05/21 1700 03/18/21 0700 04/01/21 1400     Response to Exercise   Blood Pressure (Admit) 122/62 98/54 110/64 128/64 122/62   Blood Pressure (Exercise) 156/74 120/60 138/66 132/62 --   Blood Pressure (Exit) 110/66 112/60 124/64 112/66 130/82   Heart Rate (Admit) 60 bpm 65 bpm 58 bpm 88 bpm 52 bpm   Heart Rate (Exercise) 83 bpm 94 bpm 96 bpm 93 bpm 88 bpm   Heart Rate (Exit) 62 bpm 61 bpm 66 bpm 80 bpm 73 bpm   Oxygen Saturation (Admit) 94 %  89 % 94 % 94 % 94  %   Oxygen Saturation (Exercise) 91 % 90 % 93 % 91 % 92 %   Oxygen Saturation (Exit) 94 % 95 % 95 % 94 % 93 %   Rating of Perceived Exertion (Exercise) _0 Perceived Dyspnea (Exercise) 3 0 2 0 1   Symptoms SOB, legs burning none -- none none   Comments walk test results -- -- -- --   Duration -- Progress to 30 minutes of  aerobic without signs/symptoms of physical distress Continue with 30 min of aerobic exercise without signs/symptoms of physical distress. Continue with 30 min of aerobic exercise without signs/symptoms of physical distress. Continue with 30 min of aerobic exercise without signs/symptoms of physical distress.   Intensity -- THRR unchanged THRR unchanged THRR unchanged THRR unchanged     Progression   Progression -- Continue to progress workloads to maintain intensity without signs/symptoms of physical distress. Continue to progress workloads to maintain intensity without signs/symptoms of physical distress. Continue to progress workloads to maintain intensity without signs/symptoms of physical distress. Continue to progress workloads to maintain intensity without signs/symptoms of physical distress.   Average METs -- 2.4 2.2 3.02 3     Resistance Training   Training Prescription -- Yes Yes Yes Yes   Weight -- 4 lb 5 lb 5 lb 6 lb   Reps -- 10-15 10-15 10-15 10-15     Interval Training   Interval Training -- No No No No     Treadmill   MPH -- 2 -- 2.5 --   Grade -- 0 -- 0.5 --   Minutes -- 15 -- 15 --   METs -- 2.53 -- 3.09 --     Recumbant Bike   Level -- 2 -- 5 --   Watts -- 19 -- 33 --   Minutes -- 15 -- 15 --   METs -- 2.66 -- 3.1 --     Biostep-RELP   Level -- 2 2 -- 5   Minutes -- 15 15 -- 15   METs -- 2 2 -- 3     Track   Laps -- 27 27 -- 39   Minutes -- 15 15 -- 15   METs -- 2.47 2.47 -- 3.12    Row Name 04/14/21 1500 04/29/21 1300 05/13/21 1200 05/26/21 1500 06/09/21 1200     Response to Exercise   Blood Pressure (Admit) 126/64  110/62 124/62 134/62 110/70   Blood Pressure (Exit) 132/82 110/64 128/58 122/64 112/62   Heart Rate (Admit) 87 bpm 50 bpm 50 bpm 64 bpm 55 bpm   Heart Rate (Exercise) 86 bpm 67 bpm 79 bpm 87 bpm 81 bpm   Heart Rate (Exit) 72 bpm 59 bpm 70 bpm 82 bpm 56 bpm   Oxygen Saturation (Admit) 93 % 94 % 93 % 92 % 93 %   Oxygen Saturation (Exercise) 89 % 90 % 90 % 90 % 90 %   Oxygen Saturation (Exit) 95 % 94 % 94 % 95 % 95 %   Rating of Perceived Exertion (Exercise) _1 Perceived Dyspnea (Exercise) 1 0 _2 Symptoms none none none -- none   Duration Continue with 30 min of aerobic exercise without signs/symptoms of physical distress. Continue with 30 min of aerobic exercise without signs/symptoms of physical distress. Continue with 30 min of aerobic exercise without signs/symptoms of physical distress. Continue with 30 min  of aerobic exercise without signs/symptoms of physical distress. Continue with 30 min of aerobic exercise without signs/symptoms of physical distress.   Intensity _0      Progression   Progression Continue to progress workloads to maintain intensity without signs/symptoms of physical distress. Continue to progress workloads to maintain intensity without signs/symptoms of physical distress. Continue to progress workloads to maintain intensity without signs/symptoms of physical distress. Continue to progress workloads to maintain intensity without signs/symptoms of physical distress. Continue to progress workloads to maintain intensity without signs/symptoms of physical distress.   Average METs 2.91 3.25 3.12 2.6 2.6     Resistance Training   Training Prescription _1    Weight 6 lb 7 lb 7 lb 7 lb 7 lb   Reps 10-15 10-15 10-15 10-15 10-15     Interval Training   Interval Training _2      Treadmill   MPH 2.7 -- 2.5 -- --   Grade 1 -- 1.5 -- --   Minutes 15 -- 15 -- --    METs 3.44 -- 3.23 -- --     Recumbant Bike   Level 3 -- 5 -- 5   Watts 25 -- 27 -- 22   Minutes 15 -- 15 -- 15   METs 3.79 -- 2.83 -- 2.71     NuStep   Level -- -- -- -- 5   Minutes -- -- -- -- 15   METs -- -- -- -- 2.5     Biostep-RELP   Level _3 --   Minutes _4 --   METs _5 --     Track   Laps 44 46 41 20 --   Minutes _6 --   METs 3.39 3.5 3.23 -- --     Home Exercise Plan   Plans to continue exercise at Home (comment)  walking Home (comment)  walking Home (comment)  walking Home (comment)  walking Home (comment)  walking   Frequency Add 2 additional days to program exercise sessions. Add 2 additional days to program exercise sessions. Add 2 additional days to program exercise sessions. Add 2 additional days to program exercise sessions. Add 2 additional days to program exercise sessions.   Initial Home Exercises Provided 03/04/21 03/04/21 03/04/21 03/04/21 03/04/21     Oxygen   Maintain Oxygen Saturation -- -- 88% or higher -- 88% or higher    Row Name 06/23/21 1400 07/07/21 1500           Response to Exercise   Blood Pressure (Admit) 104/60 114/68      Blood Pressure (Exit) 114/68 118/62      Heart Rate (Admit) 57 bpm 57 bpm      Heart Rate (Exercise) 86 bpm 82 bpm      Heart Rate (Exit) 66 bpm 83 bpm      Oxygen Saturation (Admit) 93 % 94 %      Oxygen Saturation (Exercise) 91 % 92 %      Oxygen Saturation (Exit) 95 % 93 %      Rating of Perceived Exertion (Exercise) 13 13      Perceived Dyspnea (Exercise) 1 1      Symptoms none none      Duration Continue with 30 min of aerobic exercise without signs/symptoms of physical distress. Continue with 30 min of aerobic exercise without signs/symptoms of  physical distress.      Intensity THRR unchanged THRR unchanged        Progression   Progression Continue to progress workloads to maintain intensity without signs/symptoms of physical distress. Continue to progress workloads to maintain  intensity without signs/symptoms of physical distress.      Average METs 3.2 2.53        Resistance Training   Training Prescription Yes Yes      Weight 7 lb 7 lb      Reps 10-15 10-15        Interval Training   Interval Training No No        Recumbant Bike   Level -- 4      Minutes -- 15      METs -- 3.8        NuStep   Level 5 5      Minutes 15 15      METs -- 2.2        Biostep-RELP   Level -- 5      Minutes -- 15      METs -- 2        Track   Laps 50 21      Minutes 15 15      METs 3.72 2.14        Home Exercise Plan   Plans to continue exercise at Home (comment)  walking Home (comment)  walking      Frequency Add 2 additional days to program exercise sessions. Add 2 additional days to program exercise sessions.      Initial Home Exercises Provided 03/04/21 03/04/21        Oxygen   Maintain Oxygen Saturation 88% or higher 88% or higher               Exercise Comments:   Exercise Comments     Row Name 02/04/21 0717           Exercise Comments First full day of exercise!  Patient was oriented to gym and equipment including functions, settings, policies, and procedures.  Patient's individual exercise prescription and treatment plan were reviewed.  All starting workloads were established based on the results of the 6 minute walk test done at initial orientation visit.  The plan for exercise progression was also introduced and progression will be customized based on patient's performance and goals.                Exercise Goals and Review:   Exercise Goals     Row Name 02/03/21 1319             Exercise Goals   Increase Physical Activity Yes       Intervention Provide advice, education, support and counseling about physical activity/exercise needs.;Develop an individualized exercise prescription for aerobic and resistive training based on initial evaluation findings, risk stratification, comorbidities and participant's personal goals.        Expected Outcomes Short Term: Attend rehab on a regular basis to increase amount of physical activity.;Long Term: Add in home exercise to make exercise part of routine and to increase amount of physical activity.;Long Term: Exercising regularly at least 3-5 days a week.       Increase Strength and Stamina Yes       Intervention Provide advice, education, support and counseling about physical activity/exercise needs.;Develop an individualized exercise prescription for aerobic and resistive training based on initial evaluation findings, risk stratification, comorbidities and participant's personal goals.  Expected Outcomes Short Term: Increase workloads from initial exercise prescription for resistance, speed, and METs.;Short Term: Perform resistance training exercises routinely during rehab and add in resistance training at home;Long Term: Improve cardiorespiratory fitness, muscular endurance and strength as measured by increased METs and functional capacity (6MWT)       Able to understand and use rate of perceived exertion (RPE) scale Yes       Intervention Provide education and explanation on how to use RPE scale       Expected Outcomes Short Term: Able to use RPE daily in rehab to express subjective intensity level;Long Term:  Able to use RPE to guide intensity level when exercising independently       Able to understand and use Dyspnea scale Yes       Intervention Provide education and explanation on how to use Dyspnea scale       Expected Outcomes Short Term: Able to use Dyspnea scale daily in rehab to express subjective sense of shortness of breath during exertion;Long Term: Able to use Dyspnea scale to guide intensity level when exercising independently       Knowledge and understanding of Target Heart Rate Range (THRR) Yes       Intervention Provide education and explanation of THRR including how the numbers were predicted and where they are located for reference       Expected Outcomes Short  Term: Able to use daily as guideline for intensity in rehab;Short Term: Able to state/look up THRR;Long Term: Able to use THRR to govern intensity when exercising independently       Able to check pulse independently Yes       Intervention Provide education and demonstration on how to check pulse in carotid and radial arteries.;Review the importance of being able to check your own pulse for safety during independent exercise       Expected Outcomes Short Term: Able to explain why pulse checking is important during independent exercise;Long Term: Able to check pulse independently and accurately       Understanding of Exercise Prescription Yes       Intervention Provide education, explanation, and written materials on patient's individual exercise prescription       Expected Outcomes Short Term: Able to explain program exercise prescription;Long Term: Able to explain home exercise prescription to exercise independently                Exercise Goals Re-Evaluation :  Exercise Goals Re-Evaluation     Row Name 02/04/21 0717 02/18/21 1509 03/04/21 0746 03/18/21 0749 04/01/21 0732     Exercise Goal Re-Evaluation   Exercise Goals Review Knowledge and understanding of Target Heart Rate Range (THRR);Increase Physical Activity;Able to understand and use rate of perceived exertion (RPE) scale;Understanding of Exercise Prescription;Able to understand and use Dyspnea scale;Able to check pulse independently;Increase Strength and Stamina Increase Physical Activity;Increase Strength and Stamina;Understanding of Exercise Prescription Increase Physical Activity;Increase Strength and Stamina;Understanding of Exercise Prescription;Able to understand and use rate of perceived exertion (RPE) scale;Able to understand and use Dyspnea scale;Knowledge and understanding of Target Heart Rate Range (THRR);Able to check pulse independently Increase Physical Activity;Increase Strength and Stamina;Understanding of Exercise  Prescription Increase Physical Activity;Increase Strength and Stamina;Understanding of Exercise Prescription   Comments Reviewed RPE and dyspnea scales, THR and program prescription with pt today.  Pt voiced understanding and was given a copy of goals to take home. Ala is off to a good start in rehab.  He has only completed his first 4  full days of exercise thus far.  He is at 2.0 mph on the treadmill.  We will continue to monitor his progress and encourage good attendance. Robert Combs is doing well in rehab.  He is have some symptoms of depression and we talked about exercising can be helpful.  Reviewed home exercise with pt today.  Pt plans to walk at home and use staff videos for exercise.  Reviewed THR, pulse, RPE, sign and symptoms, pulse oximetery and when to call 911 or MD.  Also discussed weather considerations and indoor options.  Pt voiced understanding. Robert Combs does not have the most consistent attendance due to his work that takes him out of town frequently.  He does do well in rehab when present.  He is up to level 5 on the recumbent bike.  We continue to monitor his progress. Robert Combs reports walking a thome 30 minutes on the days he does not come here (RPE 12). He reports he is not as consistent as he would like to be. Discussed adding warm up and cool down and monitoring O2.   Expected Outcomes Short: Use RPE daily to regulate intensity. Long: Follow program prescription in THR. Short: Attend rehab regularly Long: Continue to improve stamina. Short: get into routine with exercise Long: Continue to improve stamina Short: walk when out of town to maintain Long: Conitnue to improve stamina. Short: walk when not at rehab consistently Long: Conitnue to improve stamina.    [place] Name 04/14/21 1456 04/29/21 0732 04/29/21 1316 05/13/21 1229 05/26/21 1602     Exercise Goal Re-Evaluation   Exercise Goals Review Increase Physical Activity;Increase Strength and Stamina;Understanding of Exercise Prescription  Increase Physical Activity;Increase Strength and Stamina;Understanding of Exercise Prescription Increase Physical Activity;Increase Strength and Stamina Increase Physical Activity;Increase Strength and Stamina;Understanding of Exercise Prescription --   Comments Robert Combs is doing well in rehab. He is up to 44 laps on the track which is over a mile!  We will continue to monitor his progress. Robert Combs is doing well in rehab. The routine of coming to class helps.  He does yard work once a week to keep active.  We talked about trying to exericse more for the mental boost. -- Robert Combs is doing well in rehab.  He enjoys coming to class but is not as active when not here still.  He is up to 41 laps today!  We will continue to monitor his progress. Robert Combs continues to do well.  He uses 7 lb for strength work.  Staff will review importance of home exercise.   Expected Outcomes Short: Continue to walk more Long: Continue to improve stamina Short: More exericse on off days Long: Continue to improve stamina. -- Short: Try to add in more exercise Long: Continue to improve stmaina. Short: get 1-2 days of exercise outside program sessions Long:build overall stamina    Row Name 06/03/21 0720 06/09/21 1220 06/23/21 1440 06/26/21 0747 07/07/21 1519     Exercise Goal Re-Evaluation   Exercise Goals Review Increase Physical Activity;Increase Strength and Stamina;Understanding of Exercise Prescription Increase Physical Activity;Increase Strength and Stamina Increase Physical Activity;Increase Strength and Stamina Increase Physical Activity;Increase Strength and Stamina Increase Physical Activity;Increase Strength and Stamina;Understanding of Exercise Prescription   Comments Robert Combs is doing well in rehab.  He is still not doing his home exercise. He was out vacation last week in [place]. He was able to walk some more than his normal up there.   He is feeling better and stronger than when he started. Robert Combs is doing  well in rehab. He  tolerated the Nustep T4 well for the first time doing it. His oxygen saturations are staying above 88%. RPEs are in appropriate ranges. Will continue to monitor. Robert Combs did 50 lapson the track last time!  He is due for post 6MWT test soon.  We expect to see improvement on post walk. Upon graduation from pulmonary rehab Robert Combs plans to walk at home, play more golf, and possibly join the [facility] with his silver sneakers program. 6 min walk test performed today. Robert Combs increase from a pre test of 1380 to 1440 feet today. Robert Combs only has 3 more sessions.  He is ready to graduate and has a plan to walk at home.   Expected Outcomes Short: Get in more exercise in at home.  Long: Continue build stamina. Short: Continue to build up laps on the track Long: Continue to increase overall MET level and strength Short: improve post walk Long:  complete [facility] Short: Writer from pulmonary rehab. Long: Maintain independent exercise program. Graduate            Discharge Exercise Prescription (Final Exercise Prescription Changes):  Exercise Prescription Changes - 07/07/21 1500       Response to Exercise   Blood Pressure (Admit) 114/68    Blood Pressure (Exit) 118/62    Heart Rate (Admit) 57 bpm    Heart Rate (Exercise) 82 bpm    Heart Rate (Exit) 83 bpm    Oxygen Saturation (Admit) 94 %    Oxygen Saturation (Exercise) 92 %    Oxygen Saturation (Exit) 93 %    Rating of Perceived Exertion (Exercise) 13    Perceived Dyspnea (Exercise) 1    Symptoms none    Duration Continue with 30 min of aerobic exercise without signs/symptoms of physical distress.    Intensity THRR unchanged      Progression   Progression Continue to progress workloads to maintain intensity without signs/symptoms of physical distress.    Average METs 2.53      Resistance Training   Training Prescription Yes    Weight 7 lb    Reps 10-15      Interval Training   Interval Training No      Recumbant Bike   Level 4    Minutes 15     METs 3.8      NuStep   Level 5    Minutes 15    METs 2.2      Biostep-RELP   Level 5    Minutes 15    METs 2      Track   Laps 21    Minutes 15    METs 2.14      Home Exercise Plan   Plans to continue exercise at Home (comment)   walking   Frequency Add 2 additional days to program exercise sessions.    Initial Home Exercises Provided 03/04/21      Oxygen   Maintain Oxygen Saturation 88% or higher             Nutrition:  Target Goals: Understanding of nutrition guidelines, daily intake of sodium <1539m, cholesterol <2079m calories 30% from fat and 7% or less from saturated fats, daily to have 5 or more servings of fruits and vegetables.  Education: All About Nutrition: -Group instruction provided by verbal, written material, interactive activities, discussions, models, and posters to present general guidelines for heart healthy nutrition including fat, fiber, MyPlate, the role of sodium in heart healthy nutrition, utilization of the  nutrition label, and utilization of this knowledge for meal planning. Follow up email sent as well. Written material given at graduation.   Biometrics:  Pre Biometrics - 02/03/21 1320       Pre Biometrics   Height 5' 10.1" (1.781 m)    Weight 213 lb 9.6 oz (96.9 kg)    BMI (Calculated) 30.55    Single Leg Stand 6.5 seconds             Post Biometrics - 06/26/21 0742        Post  Biometrics   Height 5' 10.1" (1.781 m)    Weight 213 lb 3.2 oz (96.7 kg)    BMI (Calculated) 30.49    Single Leg Stand 14.97 seconds             Nutrition Therapy Plan and Nutrition Goals:  Nutrition Therapy & Goals - 02/05/21 0948       Nutrition Therapy   Diet Heart healthy, low Na, diabetes friendly    Protein (specify units) 110-115g    Fiber 30 grams    Whole Grain Foods 3 servings    Saturated Fats 12 max. grams    Fruits and Vegetables 8 servings/day    Sodium 1.5 grams      Personal Nutrition Goals   Nutrition Goal ST:  add mid morning snakc to avoid probable low BG and intense hunger, check BG at least 1x/day. LT: maintain BG during the day, choose healthier options that follow MyPlate stucture when eating out for lunch.    Comments He doesn't normally check his BG at home. His MD said his BG was good when he was last in per [place]. He reports no shortness of breath during or after eating. He reports that his appetite is lower than used to be. 221lb--> 214lbs in 3 weeks and feels the difference in his clothes. He feels his appetite was the same over this time. B: weekend: bacon and egg sandwich (1/2) L: go out: vegetables and meat or sandwiches (hamburger, hot dog, bolonga sandwich) D: cabbage and green beans, baked chicken, pork chop, corn, potatoes -baked, spaghetti with meat sauce and salad (not fond of pasta), he does not like rice, uses butter and olive oil, he reports not liking salt so she will use a little bit of salt - girlfriend cooks. He reports waiting until he gets "extremely hungry" to eat. He feels hunger when his legs get weak or he has stomach pains. He feels it's hard for him to get balanced meal at work when he goes out for lunch. We will start with a mid morning snack to avoid extreme hunger and probable low BG and then move to modifying lunch. Discussed importance of checking BG - he reports having all the tools to check and what to do for low BG. Discussed heart healthy eaitng, diabetes friendly eating, and COPD friendly eating.      Intervention Plan   Intervention Prescribe, educate and counsel regarding individualized specific dietary modifications aiming towards targeted core components such as weight, hypertension, lipid management, diabetes, heart failure and other comorbidities.    Expected Outcomes Short Term Goal: Understand basic principles of dietary content, such as calories, fat, sodium, cholesterol and nutrients.;Short Term Goal: A plan has been developed with personal nutrition goals set  during dietitian appointment.;Long Term Goal: Adherence to prescribed nutrition plan.             Nutrition Assessments:  MEDIFICTS Score Key: ?70 Need to make dietary changes  40-70 Heart Healthy Diet ? 40 Therapeutic Level Cholesterol Diet  Flowsheet Row Pulmonary Rehab from 02/03/2021 in [facility]  Picture Your Plate Total Score on Admission 51      Picture Your Plate Scores: <67 Unhealthy dietary pattern with much room for improvement. 41-50 Dietary pattern unlikely to meet recommendations for good health and room for improvement. 51-60 More healthful dietary pattern, with some room for improvement.  >60 Healthy dietary pattern, although there may be some specific behaviors that could be improved.   Nutrition Goals Re-Evaluation:  Nutrition Goals Re-Evaluation     [place] Name 03/04/21 0734 04/01/21 0737 04/29/21 0733 06/03/21 0727 06/26/21 0749     Goals   Current Weight -- -- 212 lb (96.2 kg) -- 212 lb 3.2 oz (96.3 kg)   Nutrition Goal ST: add mid morning snakc to avoid probable low BG and intense hunger, check BG at least 1x/day. LT: maintain BG during the day, choose healthier options that follow MyPlate stucture when eating out for lunch. ST: add protein to mid morning snack to avoid probable low BG and intense hunger, check BG at least 1x/day. LT: maintain BG during the day, choose healthier options that follow MyPlate stucture when eating out for lunch. ST: add protein to mid morning snack to avoid probable low BG and intense hunger, check BG at least 1x/day. LT: maintain BG during the day, choose healthier options that follow MyPlate stucture when eating out for lunch. Short: Focus on getting more protein Long: Continue to find balance planning. Short: Focus on getting more protein Long: Continue to find balance planning.   Comment Robert Combs has not had a lot of appetite.  He is eating once a day.  He has not been checking his sugars like he should.  We  talked about adding in a protein shake to help. Continue to try to make a good options. Mckenzie has added a mid morning snack in the form of some fruit. He does not have a BG monitor at home. He will eat a peanut butter and jelly sandwich when he is hungry. Is not interested in protein shakes as he feels they are for old people, but is willing to try protein bars or soymilk or nuts and seeds with fruit. He reports his appetite continues to stay low. Linc has been losing some weight since just before rehab. He is at 212 lb today.  He has not really been focusing on protein so we talked about it again and the importance of why he needs it.  He is still only eating one meal so we talked about using a protein drink. Mister was gone on vacation last week and did not stick to his diet. He was eating lots of vegetables.  We talked about focusing in on proteins again.  He eats beans at least three times a week and chicken most other days. He is planning to get back to to his diet this week. Sheldon plans to continue to follow nutrition guidelines upon graduation from pulmonary rehab.   Expected Outcome Short: Try to eat more frequently and try protein shake Long: Continue to make healthy options. ST: add protein to mid morning snack to avoid probable low BG and intense hunger, check BG at least 1x/day. LT: maintain BG during the day, choose healthier options that follow MyPlate stucture when eating out for lunch. Short: Focus on getting more protein Long: Continue to find balance planning. Short: Get back to diet Long: Continue to  add in more protein. Short: Get back to diet Long: Continue to add in more protein. Maintain healthy eating upon graduation from program.            Nutrition Goals Discharge (Final Nutrition Goals Re-Evaluation):  Nutrition Goals Re-Evaluation - 06/26/21 0749       Goals   Current Weight 212 lb 3.2 oz (96.3 kg)    Nutrition Goal Short: Focus on getting more protein Long: Continue  to find balance planning.    Comment Shalom plans to continue to follow nutrition guidelines upon graduation from pulmonary rehab.    Expected Outcome Short: Get back to diet Long: Continue to add in more protein. Maintain healthy eating upon graduation from program.             Psychosocial: Target Goals: Acknowledge presence or absence of significant depression and/or stress, maximize coping skills, provide positive support system. Participant is able to verbalize types and ability to use techniques and skills needed for reducing stress and depression.   Education: Stress, Anxiety, and Depression - Group verbal and visual presentation to define topics covered.  Reviews how body is impacted by stress, anxiety, and depression.  Also discusses healthy ways to reduce stress and to treat/manage anxiety and depression.  Written material given at graduation.   Education: Sleep Hygiene -Provides group verbal and written instruction about how sleep can affect your health.  Define sleep hygiene, discuss sleep cycles and impact of sleep habits. Review good sleep hygiene tips.    Initial Review & Psychosocial Screening:  Initial Psych Review & Screening - 01/24/21 1541       Initial Review   Current issues with Current Stress Concerns    Source of Stress Concerns Family    Comments children stress him. 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      Family Dynamics   Good Support System? Yes   2 daughters, one son (one in [place] calls everyday other in [place]) and a sister.  .     Barriers   Psychosocial barriers to participate in program There are no identifiable barriers or psychosocial needs.;The patient should benefit from training in stress management and relaxation.      Screening Interventions   Interventions Provide feedback about the scores to participant;To provide support and resources with identified psychosocial needs;Encouraged to exercise    Expected  Outcomes Short Term goal: Utilizing psychosocial counselor, staff and physician to assist with identification of specific Stressors or current issues interfering with healing process. Setting desired goal for each stressor or current issue identified.;Long Term Goal: Stressors or current issues are controlled or eliminated.;Short Term goal: Identification and review with participant of any Quality of Life or Depression concerns found by scoring the questionnaire.;Long Term goal: The participant improves quality of Life and PHQ9 Scores as seen by post scores and/or verbalization of changes             Quality of Life Scores:  Scores of 19 and below usually indicate a poorer quality of life in these areas.  A difference of  2-3 points is a clinically meaningful difference.  A difference of 2-3 points in the total score of the Quality of Life Index has been associated with significant improvement in overall quality of life, self-image, physical symptoms, and general health in studies assessing change in quality of life.  PHQ-9: Recent Review Flowsheet Data     Depression screen [facility] 2/9 07/01/2021 06/03/2021 04/29/2021 04/01/2021 03/04/2021  Decreased Interest _0 Down, Depressed, Hopeless 0 _1 PHQ - 2 Score _2 Altered sleeping _3 Tired, decreased energy _4 Change in appetite _5 Feeling bad or failure about yourself  0 0 0 0 0   Trouble concentrating _6 0 0   Moving slowly or fidgety/restless 0 0 0 0 0   Suicidal thoughts 0 0 0 0 0   PHQ-9 Score _7 Difficult doing work/chores Somewhat difficult Somewhat difficult Not difficult at all Not difficult at all Not difficult at all      Interpretation of Total Score  Total Score Depression Severity:  1-4 = Minimal depression, 5-9 = Mild depression, 10-14 = Moderate depression, 15-19 = Moderately severe depression, 20-27 = Severe depression   Psychosocial Evaluation and  Intervention:  Psychosocial Evaluation - 01/24/21 1601       Psychosocial Evaluation & Interventions   Interventions Encouraged to exercise with the program and follow exercise prescription    Comments Robert Combs has no barriers to attending the program. He does travel for work and may need to flex his schedule. He lives alone and has children in the area and a girlfriend. He does get stressed and has a psychiatrist at the [place] that he talks to. He is a smoker and is trting to quit. HIs support poepl are his family. He wants to improve his breathing,have more energy and quit smoking.    Expected Outcomes STG Robert Combs is able to attend at least 2 days aweek depite his work schedule. He is able to Quit tobacco. LTG: Brandy has quit and maintains tobacco cessation, he is able to continue any progress he made during the program.    Continue Psychosocial Services  Follow up required by staff             Psychosocial Re-Evaluation:  Psychosocial Re-Evaluation     [place] Name 03/04/21 0725 04/01/21 0756 04/29/21 0726 06/03/21 0730 06/26/21 0751     Psychosocial Re-Evaluation   Current issues with Current Depression;Current Sleep Concerns;History of Depression;Current Stress Concerns Current Depression;Current Sleep Concerns;History of Depression;Current Stress Concerns;Current Psychotropic Meds Current Depression;Current Sleep Concerns;History of Depression;Current Stress Concerns;Current Psychotropic Meds Current Depression;Current Sleep Concerns;History of Depression;Current Stress Concerns;Current Psychotropic Meds Current Depression;Current Sleep Concerns;History of Depression;Current Stress Concerns;Current Psychotropic Meds   Comments Robert Combs is doing well in rehab.  We did his PHQ test again and his scores are better, but still feeling depressed.  He is seeing the pyschiatrist at [place].  He has nights where he doesn't sleep well and that has been going on for more than 6 months but he has not talked to  doctor about any of this. He was encouraged to talk with his doctor to help with sleep. His lack of sleep is making him feel tired. He reports being on medication for anxiety/depression - not sure what it is for exactly or what the medication is - he takes it daily. He sees a psychiatrist who may want to change the medication. He sees a therapist 1x/3 months - he says he cant get in any more frequently due to their schedule; discussed how it would benefit to find a practice he could see more often. His daughter is causing him stress - she always wants something from him. He  also finds work depressing. He enjoys working on his yard, but it had been too hard. He also enjoys golfing. He has a smaller appetite and has had for 6-8 months (unsure why). HE has trouble falling asleep and staying asleep - will stay asleep for 1 hour at a time. His MD is going to prescibe him sleep medication; he denies racing thoughts at night and is not sure why he can't sleep. Discussed different kinds of meditation and suggested trying some out. Robert Combs is doing well in rehab.  He redid his PHQ today and it had gone up 3 points from last time.  He continues to have some stress from his daughter. He is taking his antidepressants and anxiety pills.  He finds work stressful too.  He stays in the house and sleeps all weekend.  He does have an appointment with psychiastrist on 10/9.  His PTSD is also interferring with his sleep as he keeps waking and then not able to get back to sleep.  He does have sleep pills.  He is going to theapist every three months.  He is not open to going with online platform.  We talked about trying sleep pills on weekends to see if they help. Robert Combs continues to struggle with sleep.  He is afraid of over sleeping with it so we talked about taking it earlier to not stress over it.  He did enjoy vacation last week.  He continues to feel like his meds are working but that they may need some tweaking.  He has a follow up  on Thursday.  His PHQ is down two more points!  He is going to talk to his doctor some more this week. Robert Combs continue to struggle with sleep. His doctor has given him a new depression and anxiety mediation that Jia states has been helping his mood a little better. He still struggles with consistent sleep patterns.   Expected Outcomes SHort: Talk to doctor about sleep and depression Long: Continue to find the positive. SHort: see how sleep medication helps, see a therapist more frequently, try meditation techniques  Long: Continue to find the positive. Short: Try sleep med on weekend Long: Continue to try to exercise more to sleep better. Short: Talk to doctor on Thursday about depression meds  Long: Continue to improve sleeping habits. Continue to work with doctor upon graduation to maintain good mental health habits and control depression and anxiety. Continue to work on improving sleep habits.   Interventions Stress management education;Encouraged to attend Cardiac Rehabilitation for the exercise Stress management education;Relaxation education;Encouraged to attend Pulmonary Rehabilitation for the exercise Stress management education;Relaxation education;Encouraged to attend Pulmonary Rehabilitation for the exercise -- --   Continue Psychosocial Services  Follow up required by staff Follow up required by staff Follow up required by staff -- --     Initial Review   Source of Stress Concerns -- Family;Occupation -- -- --            Psychosocial Discharge (Final Psychosocial Re-Evaluation):  Psychosocial Re-Evaluation - 06/26/21 0751       Psychosocial Re-Evaluation   Current issues with Current Depression;Current Sleep Concerns;History of Depression;Current Stress Concerns;Current Psychotropic Meds    Comments Robert Combs continue to struggle with sleep. His doctor has given him a new depression and anxiety mediation that Hari states has been helping his mood a little better. He still struggles  with consistent sleep patterns.    Expected Outcomes Continue to work with doctor upon graduation to maintain good  mental health habits and control depression and anxiety. Continue to work on improving sleep habits.             Education: Education Goals: Education classes will be provided on a weekly basis, covering required topics. Participant will state understanding/return demonstration of topics presented.  Learning Barriers/Preferences:  Learning Barriers/Preferences - 01/24/21 1546       Learning Barriers/Preferences   Learning Barriers None    Learning Preferences None             General Pulmonary Education Topics:  Infection Prevention: - Provides verbal and written material to individual with discussion of infection control including proper hand washing and proper equipment cleaning during exercise session. Flowsheet Row Pulmonary Rehab from 04/17/2021 in [facility] Cardiac and Pulmonary Rehab  Date 02/03/21  Educator [facility]  Instruction Review Code 1- Verbalizes Understanding       Falls Prevention: - Provides verbal and written material to individual with discussion of falls prevention and safety. Flowsheet Row Pulmonary Rehab from 04/17/2021 in [facility] Cardiac and Pulmonary Rehab  Date 02/03/21  Educator [facility]  Instruction Review Code 1- Verbalizes Understanding       Chronic Lung Disease Review: - Group verbal instruction with posters, models, PowerPoint presentations and videos,  to review new updates, new respiratory medications, new advancements in procedures and treatments. Providing information on websites and "800" numbers for continued self-education. Includes information about supplement oxygen, available portable oxygen systems, continuous and intermittent flow rates, oxygen safety, concentrators, and Medicare reimbursement for oxygen. Explanation of Pulmonary Drugs, including class, frequency, complications, importance of spacers, rinsing mouth after steroid  MDI's, and proper cleaning methods for nebulizers. Review of basic lung anatomy and physiology related to function, structure, and complications of lung disease. Review of risk factors. Discussion about methods for diagnosing sleep apnea and types of masks and machines for OSA. Includes a review of the use of types of environmental controls: home humidity, furnaces, filters, dust mite/pet prevention, HEPA vacuums. Discussion about weather changes, air quality and the benefits of nasal washing. Instruction on Warning signs, infection symptoms, calling MD promptly, preventive modes, and value of vaccinations. Review of effective airway clearance, coughing and/or vibration techniques. Emphasizing that all should Create an Action Plan. Written material given at graduation. Flowsheet Row Pulmonary Rehab from 04/17/2021 in [facility] Cardiac and Pulmonary Rehab  Education need identified 02/03/21       AED/CPR: - Group verbal and written instruction with the use of models to demonstrate the basic use of the AED with the basic ABC's of resuscitation.    Anatomy and Cardiac Procedures: - Group verbal and visual presentation and models provide information about basic cardiac anatomy and function. Reviews the testing methods done to diagnose heart disease and the outcomes of the test results. Describes the treatment choices: Medical Management, Angioplasty, or Coronary Bypass Surgery for treating various heart conditions including Myocardial Infarction, Angina, Valve Disease, and Cardiac Arrhythmias.  Written material given at graduation. Flowsheet Row Pulmonary Rehab from 04/17/2021 in [facility] Cardiac and Pulmonary Rehab  Date 04/17/21  Educator [facility]  Instruction Review Code 1- Verbalizes Understanding       Medication Safety: - Group verbal and visual instruction to review commonly prescribed medications for heart and lung disease. Reviews the medication, class of the drug, and side effects. Includes the steps to  properly store meds and maintain the prescription regimen.  Written material given at graduation. Flowsheet Row Pulmonary Rehab from 04/17/2021 in [facility] Cardiac and Pulmonary Rehab  Date 03/06/21  Educator SB  Instruction Review Code 1- Verbalizes Understanding       Other: -Provides group and verbal instruction on various topics (see comments)   Knowledge Questionnaire Score:  Knowledge Questionnaire Score - 02/03/21 1323       Knowledge Questionnaire Score   Pre Score 13/16 Education Focus: Chonic Lung Disease, O2 safety              Core Components/Risk Factors/Patient Goals at Admission:  Personal Goals and Risk Factors at Admission - 02/03/21 1323       Core Components/Risk Factors/Patient Goals on Admission    Weight Management Yes;Weight Maintenance;Obesity    Intervention Weight Management: Provide education and appropriate resources to help participant work on and attain dietary goals.;Weight Management: Develop a combined nutrition and exercise program designed to reach desired caloric intake, while maintaining appropriate intake of nutrient and fiber, sodium and fats, and appropriate energy expenditure required for the weight goal.;Weight Management/Obesity: Establish reasonable short term and long term weight goals.;Obesity: Provide education and appropriate resources to help participant work on and attain dietary goals.    Admit Weight 213 lb 9.6 oz (96.9 kg)    Goal Weight: Short Term 208 lb (94.3 kg)    Goal Weight: Long Term 200 lb (90.7 kg)    Expected Outcomes Short Term: Continue to assess and modify interventions until short term weight is achieved;Long Term: Adherence to nutrition and physical activity/exercise program aimed toward attainment of established weight goal;Weight Loss: Understanding of general recommendations for a balanced deficit meal plan, which promotes 1-2 lb weight loss per week and includes a negative energy balance of [phone]  kcal/d;Understanding recommendations for meals to include 15-35% energy as protein, 25-35% energy from fat, 35-60% energy from carbohydrates, less than 286m of dietary cholesterol, 20-35 gm of total fiber daily;Understanding of distribution of calorie intake throughout the day with the consumption of 4-5 meals/snacks    Tobacco Cessation Yes    Number of packs per day 1 per day    Intervention Assist the participant in steps to quit. Provide individualized education and counseling about committing to Tobacco Cessation, relapse prevention, and pharmacological support that can be provided by physician.;OAdvice worker assist with locating and accessing local/national Quit Smoking programs, and support quit date choice.    Expected Outcomes Short Term: Will demonstrate readiness to quit, by selecting a quit date.;Short Term: Will quit all tobacco product use, adhering to prevention of relapse plan.;Long Term: Complete abstinence from all tobacco products for at least 12 months from quit date.    Improve shortness of breath with ADL's Yes    Intervention Provide education, individualized exercise plan and daily activity instruction to help decrease symptoms of SOB with activities of daily living.    Expected Outcomes Short Term: Improve cardiorespiratory fitness to achieve a reduction of symptoms when performing ADLs;Long Term: Be able to perform more ADLs without symptoms or delay the onset of symptoms    Increase knowledge of respiratory medications and ability to use respiratory devices properly  Yes    Intervention Provide education and demonstration as needed of appropriate use of medications, inhalers, and oxygen therapy.    Expected Outcomes Short Term: Achieves understanding of medications use. Understands that oxygen is a medication prescribed by physician. Demonstrates appropriate use of inhaler and oxygen therapy.;Long Term: Maintain appropriate use of medications, inhalers, and  oxygen therapy.    Diabetes Yes    Intervention Provide education about signs/symptoms and action to take for hypo/hyperglycemia.;Provide education about  proper nutrition, including hydration, and aerobic/resistive exercise prescription along with prescribed medications to achieve blood glucose in normal ranges: Fasting glucose 65-99 mg/dL    Expected Outcomes Short Term: Participant verbalizes understanding of the signs/symptoms and immediate care of hyper/hypoglycemia, proper foot care and importance of medication, aerobic/resistive exercise and nutrition plan for blood glucose control.;Long Term: Attainment of HbA1C < 7%.    Hypertension Yes    Intervention Provide education on lifestyle modifcations including regular physical activity/exercise, weight management, moderate sodium restriction and increased consumption of fresh fruit, vegetables, and low fat dairy, alcohol moderation, and smoking cessation.;Monitor prescription use compliance.    Expected Outcomes Short Term: Continued assessment and intervention until BP is < 140/69m HG in hypertensive participants. < 130/818mHG in hypertensive participants with diabetes, heart failure or chronic kidney disease.;Long Term: Maintenance of blood pressure at goal levels.    Lipids Yes    Intervention Provide education and support for participant on nutrition & aerobic/resistive exercise along with prescribed medications to achieve LDL <7043mHDL >78m22m  Expected Outcomes Short Term: Participant states understanding of desired cholesterol values and is compliant with medications prescribed. Participant is following exercise prescription and nutrition guidelines.;Long Term: Cholesterol controlled with medications as prescribed, with individualized exercise RX and with personalized nutrition plan. Value goals: LDL < 70mg21mL > 40 mg.             Education:Diabetes - Individual verbal and written instruction to review signs/symptoms of diabetes,  desired ranges of glucose level fasting, after meals and with exercise. Acknowledge that pre and post exercise glucose checks will be done for 3 sessions at entry of program. Flowsheet Row [facility]monary [facility]  Date 02/03/21  [facility] Review Code 1- Verbalizes Understanding       Know Your Numbers and Heart Failure: - Group verbal and visual instruction to discuss disease risk factors for cardiac and pulmonary disease and treatment options.  Reviews associated critical values for Overweight/Obesity, Hypertension, Cholesterol, and Diabetes.  Discusses basics of heart failure: signs/symptoms and treatments.  Introduces Heart Failure Zone chart for action plan for heart failure.  Written material given at graduation.   Core Components/Risk Factors/Patient Goals Review:   Goals and Risk Factor Review     Row Name 02/03/21 1326 03/04/21 0729 04/01/21 0740 04/29/21 0737 06/03/21 0722     Core Components/Risk Factors/Patient Goals Review   Personal Goals Review Tobacco Cessation Tobacco Cessation;Weight Management/Obesity;Increase knowledge of respiratory medications and ability to use respiratory devices properly.;Hypertension;Diabetes;Improve shortness of breath with ADL's Tobacco Cessation;Weight Management/Obesity;Hypertension;Diabetes;Improve shortness of breath with ADL's Tobacco Cessation;Weight Management/Obesity;Hypertension;Diabetes;Improve shortness of breath with ADL's;Increase knowledge of respiratory medications and ability to use respiratory devices properly. Tobacco Cessation;Weight Management/Obesity;Hypertension;Diabetes;Improve shortness of breath with ADL's;Increase knowledge of respiratory medications and ability to use respiratory devices properly.   Review MelviLeny current tobacco user. Intervention for tobacco cessation was provided at the initial medical review. He was asked about readiness to quit and reported that  he is considering it and has cut back already . Patient was advised and educated about tobacco cessation using combination therapy, tobacco cessation classes, quit line, and quit smoking apps. Patient demonstrated understanding of this material but declined hand outs at this time. Staff will continue to provide encouragement and follow up with the patient throughout the program. MelviHarlisoing well in rehab.  He is still smoking a pack a day and not leaving the house on the  weekends.  He was encouraged to call his doctor about the depression. He is not really intersted in quitting yet.  His pressures are still all over the place and he only checks them occassionally at home.  We talked about getting into habit of checking it at home to track it better.  He has not been checking his sugars as he does not feel like doing anything at home.  He is doing well with his meds. His chest has been feeling tight which does not help his breathing. BP 122/60 today - he checks his bp at home - he does not recall how it usually runs, but last night bp 156/70. He is taking his medications as prescribed and he is having no issues. He does not check his BG at home and is not interested in doing so. He practices PLB and feels that it helps, but his overall breathing has stayed the same. He is interested in quitting smoking, but does not have a quit date and would like to think about one. He reports he has lost weight since starting. Tanor's weight has been holding steady.  His pressures are doing well and he is still checking them.  His sugars are doing well.  He is only eating one meal still and not exercising so we talked about how exercise can help with weightloss too.  He is doing better with his breathing and has no problems with his meds.  He continues to smoke but is down to 1-2 a day Braheem is doing well in rehab.  His weight was up some today after vacation last week.  He is still around 10 cigarettes a day last week.  He  continues to work towards quitting.  He was able to walk and get around the city better than before with less SOB.  Meds are still working well.  His pressures continue to do well in class as he does not check it at home. His sugars feel okay, but he has not been checking them.   Expected Outcomes Short: Continue to reduce number of cigarettes per day Long: Set a quit date Short: Call doctor about depression and check sugars and pressures LOng: Continue to work on a routine to help with depression. Short: Continue to check BP at home, find his BG monitor, decide on a smoking quit date Long: quit smoking short: More protein Long: Conitnue to focus sleep LOng: Continue to work toward quitting. Short: Get weight back down Long: Continue to work toward cessation.    [place] Name 06/26/21 0753             Core Components/Risk Factors/Patient Goals Review   Personal Goals Review Tobacco Cessation;Weight Management/Obesity;Hypertension;Diabetes;Improve shortness of breath with ADL's;Increase knowledge of respiratory medications and ability to use respiratory devices properly.       Review [place] continues to take all medications as prescribed to help control risk factors. Upon graduation he will continue to monitor risk factors and control them with medication and continued exercise and dietary changes.       Expected Outcomes Short: graduate from pulmonary rehab. Long: Continue lifestyle changes learned in program.                Core Components/Risk Factors/Patient Goals at Discharge (Final Review):   Goals and Risk Factor Review - 06/26/21 0753       Core Components/Risk Factors/Patient Goals Review   Personal Goals Review Tobacco Cessation;Weight Management/Obesity;Hypertension;Diabetes;Improve shortness of breath with ADL's;Increase knowledge of respiratory medications  and ability to use respiratory devices properly.    Review Dimarco continues to take all medications as prescribed to help control  risk factors. Upon graduation he will continue to monitor risk factors and control them with medication and continued exercise and dietary changes.    Expected Outcomes Short: graduate from pulmonary rehab. Long: Continue lifestyle changes learned in program.             ITP Comments:  ITP Comments     Row Name 01/24/21 1610 02/03/21 1152 02/04/21 0717 02/05/21 1022 02/26/21 1154   ITP Comments Virtual orientation call completed today. he has an appointment on Date: 02/03/2021  for EP eval and gym Orientation.  Documentation of diagnosis can be found in Media Tab Date: 02/03/2021 adding [facility] notees to Media Tab . Completed 6MWT and gym orientation. Initial ITP created and sent for review to Dr. Zetta Bills, Medical Director. First full day of exercise!  Patient was oriented to gym and equipment including functions, settings, policies, and procedures.  Patient's individual exercise prescription and treatment plan were reviewed.  All starting workloads were established based on the results of the 6 minute walk test done at initial orientation visit.  The plan for exercise progression was also introduced and progression will be customized based on patient's performance and goals. Completed initial RD consultation 30 Day review completed. Medical Director ITP review done, changes made as directed, and signed approval by Medical Director.    [place] Name 03/26/21 0820 04/23/21 0753 05/21/21 1421 06/18/21 0813 07/16/21 0750   ITP Comments 30 Day review completed. Medical Director ITP review done, changes made as directed, and signed approval by Medical Director. 30 Day review completed. Medical Director ITP review done, changes made as directed, and signed approval by Medical Director. 30 day review completed. ITP sent to Dr. Zetta Bills, Medical Director of  Pulmonary Rehab. Continue with ITP unless changes are made by physician. 30 Day review completed. Medical Director ITP review done, changes made as  directed, and signed approval by Medical Director. 30 Day review completed. Medical Director ITP review done, changes made as directed, and signed approval by Medical Director.    [place] Name 07/22/21 0728           ITP Comments Dunbar graduated today from  rehab with 36 sessions completed.  Details of the patient's exercise prescription and what He needs to do in order to continue the prescription and progress were discussed with patient.  Patient was given a copy of prescription and goals.  Patient verbalized understanding.  Kavin plans to continue to exercise by walking at home.                Comments: Discharge ITP

## 2023-03-18 NOTE — Progress Notes (Signed)
Cardiology Office Note:   Date:  03/19/2023  NAME:  Robert Combs    MRN: [id number] DOB:  1946/03/05   PCP:  [facility]  Cardiologist:  None  Electrophysiologist:  None   Referring MD: [facility]   Chief Complaint  Patient presents with   Shortness of Breath         History of Present Illness:   Robert Combs is a 77 y.o. male with a hx of COPD, DM, HTN, CKD, OSA who is being seen today for the evaluation of SOB/SVT/PACs at the request of [facility].  He reports for the past 18 months he has had low energy.  He describes shortness of breath with activity.  He also describes cough and congestion.  He has no COPD.  Reports an occasional twinge in his chest.  Occurs with activity.  Not prolonged.  He reports he was given steroids by his pulmonologist and this improved his symptoms but once he stopped the steroids his symptoms resumed.  He also describes phlegm and cough.  He has diabetes which is uncontrolled.  Not on any lipid-lowering agents as he reports Lipitor caused issues.  He had an echo through the [place] which was normal.  He wore a monitor which showed brief SVTs and frequent PACs.  He has no symptoms of palpitations.  I do not believe this explains his symptoms.  He has baseline bradycardia with heart rate in the 60s.  We discussed avoiding AV nodal agents at this time.  I believe this is likely driven by his lung disease.  He has never had a heart attack or stroke.  He does have significant risk factors for this.  We discussed a stress test to exclude obstructive CAD.  I have recommended a PET scan given ectopy, obesity and abnormal EKG.  He does have sleep apnea but uses his CPAP.  He has hypertension that is well-controlled.  He takes an aspirin daily.  CV exam unremarkable.  He is a former smoker.  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  He is single.  He has 4 children.  He has several grandchildren.  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   CXR normal 12/14/2022 Monitor 14 days 01/04/2023: Minimum HR 45 bpm, Max 160 bpm, average HR 62 bpm, SVT noted (23 episodes in 14 days; longest duration 8.8 second duration, EAT), PACs 30.5%  TTE 55%, normal RV   Problem List DM -A1c 10.8 HTN COPD -moderate Tobacco abuse  HLD -T chol 155, HDL 34, LDL 49, TG 360 OSA CKD IIIa -eGFR 55 8. PACs  -30% burden  9. RBBB  Past Medical History: Past Medical History:  Diagnosis Date   Chronic kidney disease    COPD (chronic obstructive pulmonary disease) (HCC)    Coronary artery disease    DDD (degenerative disc disease)    Diabetes mellitus without complication (HCC)    Hyperlipidemia    Hypertension     Past Surgical History: History reviewed. No pertinent surgical history.  Current Medications: Current Meds  Medication Sig   albuterol (PROVENTIL HFA;VENTOLIN HFA) 108 (90 Base) MCG/ACT inhaler Inhale 1 puff into the lungs every 6 (six) hours as needed for wheezing or shortness of breath.   albuterol (VENTOLIN HFA) 108 (90 Base) MCG/ACT inhaler Inhale into the lungs.   Alogliptin Benzoate 25 MG TABS Take by mouth.   Ascorbic Acid (VITAMIN C) 100 MG tablet Take 100  mg by mouth daily.   aspirin EC 81 MG tablet Take 81 mg by mouth daily.     budesonide-formoterol (SYMBICORT) 160-4.5 MCG/ACT inhaler Inhale into the lungs.   cholecalciferol (VITAMIN D) 1000 UNITS tablet Take 1,000 Units by mouth daily.     losartan (COZAAR) 50 MG tablet Take 25 mg by mouth daily.   metFORMIN (GLUCOPHAGE) 500 MG tablet Take 1 tablet (500 mg) PO daily x 1 week, then increase to 1 tablet PO BID thereafter   mometasone (ASMANEX) 220 MCG/INH inhaler Take by mouth.   omeprazole (PRILOSEC OTC) 20 MG tablet Take 20 mg by mouth daily.   rosuvastatin (CRESTOR) 20 MG tablet Take 1 tablet (20 mg total) by mouth daily.   traMADol (ULTRAM) 50 MG tablet Take 1 tablet (50 mg total) by mouth every 6 (six) hours as needed.     Allergies:     Patient has no known allergies.   Social History: Social History   Socioeconomic History   Marital status: Divorced    Spouse name: Not on file   Number of children: 4   Years of education: Not on file   Highest education level: Not on file  Occupational History   Not on file  Tobacco Use   Smoking status: Former    Current packs/day: 0.00    Average packs/day: 1 pack/day for 25.0 years (25.0 ttl pk-yrs)    Types: Cigarettes    Start date: 07/12/1995    Quit date: 07/11/2020    Years since quitting: 2.6   Smokeless tobacco: Never   Tobacco comments:    02/03/21 currently at 1 ppd, [facility] and [facility] is [facility] it  Vaping Use   Vaping status: Never Used  Substance and Sexual Activity   Alcohol use: Yes    Alcohol/week: 2.0 - 3.0 standard drinks of alcohol    Types: 2 - 3 Shots of liquor per week   Drug use: No   Sexual activity: Not on file  Other Topics Concern   Not on file  Social History Narrative   Not on file   Social Determinants of Health   Financial Resource Strain: Not on file  Food Insecurity: Not on file  Transportation Needs: Not on file  Physical Activity: Not on file  Stress: Not on file  Social Connections: Unknown (12/29/2021)   Received from [employer]   Social Network    Social Network: Not on file     Family History: The patient's family history includes Cancer in his father; Coronary artery disease in his mother and sister; Diabetes type II in his mother; Heart disease in his mother.  ROS:   All other ROS reviewed and negative. Pertinent positives noted in the HPI.     EKGs/Labs/Other Studies Reviewed:   The following studies were personally reviewed by me today:  EKG:  EKG is ordered today.    EKG Interpretation Date/Time:  Friday March 19 2023 08:18:51 EDT Ventricular Rate:  60 PR Interval:  184 QRS Duration:  142 QT Interval:  432 QTC Calculation: 432 R Axis:   -29  Text Interpretation: Sinus rhythm with  Premature atrial complexes in a pattern of bigeminy Right bundle branch block Possible Inferior infarct , age undetermined Confirmed by Lennie Odor [phone]) on 03/19/2023 8:27:21 AM   Recent Labs: No results found for requested labs within last 365 days.   Recent Lipid Panel No results found for: "CHOL", "TRIG", "HDL", "CHOLHDL", "VLDL", "LDLCALC", "LDLDIRECT"  Physical  Exam:   VS:  BP 120/70 (BP Location: Right Arm, Patient Position: Sitting, Cuff Size: Normal)   Pulse 60   Ht 5\' 9"  (1.753 m)   Wt 217 lb 3.2 oz (98.5 kg)   SpO2 97%   BMI 32.07 kg/m    Wt Readings from Last 3 Encounters:  03/19/23 217 lb 3.2 oz (98.5 kg)  06/26/21 213 lb 3.2 oz (96.7 kg)  02/03/21 213 lb 9.6 oz (96.9 kg)    General: Well nourished, well developed, in no acute distress Head: Atraumatic, normal size  Eyes: PEERLA, EOMI  Neck: Supple, no JVD Endocrine: No thryomegaly Cardiac: Normal S1, S2; RRR; no murmurs, rubs, or gallops Lungs: Clear to auscultation bilaterally, no wheezing, rhonchi or rales  Abd: Soft, nontender, no hepatomegaly  Ext: No edema, pulses 2+ Musculoskeletal: No deformities, BUE and BLE strength normal and equal Skin: Warm and dry, no rashes   Neuro: Alert and oriented to person, place, time, and situation, CNII-XII grossly intact, no focal deficits  Psych: Normal mood and affect   ASSESSMENT:   Robert Combs is a 77 y.o. male who presents for the following: 1. Chest pain of uncertain etiology   2. SOB (shortness of breath) on exertion   3. Bradycardia   4. SVT (supraventricular tachycardia)   5. PAC (premature atrial contraction)   6. Mixed hyperlipidemia   7. Precordial pain     PLAN:   1. Chest pain of uncertain etiology 2. SOB (shortness of breath) on exertion -Exertional shortness of breath and chest discomfort.  I have recommended cardiac PET scan.  His EKG is abnormal.  He has frequent ectopy and CKD.  Not a good candidate for coronary CTA.  He cannot exercise  on a treadmill stress test.  He is also obese with a BMI of 32.  I believe [facility] is the best option for evaluating him for obstructive CAD.  3. Bradycardia -Do not believe bradycardia explains his symptoms.  Average heart rate 60s.  No evidence of high-grade conduction disease.  4. SVT (supraventricular tachycardia) 5. PAC (premature atrial contraction) -Brief SVT.  No symptoms of palpitations.  This does not require treatment.  He does have frequent PACs.  Given his baseline bradycardia and the fact that he does not feel them I do not believe this explains his symptoms or needs treatment at this time.  I would like to exclude obstructive CAD first.  We will reassess in 6 months.  His echo was normal through the [place].  His monitor also was largely normal.  6. Mixed hyperlipidemia -Diabetic.  Should be on a statin.  Triglycerides elevated.  He is intolerant to Lipitor.  We will try Crestor 20 mg daily.  He will see me back in 6 months to discuss further.  Informed Consent   Shared Decision Making/Informed Consent The risks [chest pain, shortness of breath, cardiac arrhythmias, dizziness, blood pressure fluctuations, myocardial infarction, stroke/transient ischemic attack, nausea, vomiting, allergic reaction, radiation exposure, metallic taste sensation and life-threatening complications (estimated to be 1 in 10,000)], benefits (risk stratification, diagnosing coronary artery disease, treatment guidance) and alternatives of a cardiac PET stress test were discussed in detail with Robert Combs and he agrees to proceed.     Disposition: Return in about 6 months (around 09/19/2023).  Medication Adjustments/Labs and Tests Ordered: Current medicines are reviewed at length with the patient today.  Concerns regarding medicines are outlined above.  Orders Placed This Encounter  Procedures   NM PET CT CARDIAC PERFUSION  MULTI W/ABSOLUTE BLOODFLOW   Cardiac Stress Test: Informed Consent Details:  Physician/Practitioner Attestation; Transcribe to consent form and obtain patient signature   EKG 12-Lead   Meds ordered this encounter  Medications   rosuvastatin (CRESTOR) 20 MG tablet    Sig: Take 1 tablet (20 mg total) by mouth daily.    Dispense:  90 tablet    Refill:  3   Patient Instructions  Medication Instructions:  START ROSUVASTATIN 20 MG AT BEDTIME  *If you need a refill on your cardiac medications before your next appointment, please call your pharmacy*   Lab Work: NONE If you have labs (blood work) drawn today and your tests are completely normal, you will receive your results only by: MyChart Message (if you have MyChart) OR A paper copy in the mail If you have any lab test that is abnormal or we need to change your treatment, we will call you to review the results.   Testing/Procedures: How to Prepare for Your Cardiac PET/CT Stress Test:  1. Please do not take these medications before your test:   Medications that may interfere with the cardiac pharmacological stress agent (ex. nitrates - including erectile dysfunction medications, isosorbide mononitrate, tamulosin or beta-blockers) the day of the exam. (Erectile dysfunction medication should be held for at least 72 hrs prior to test) Theophylline containing medications for 12 hours. Dipyridamole 48 hours prior to the test. Your remaining medications may be taken with water.  2. Nothing to eat or drink, except water, 3 hours prior to arrival time.   NO caffeine/decaffeinated products, or chocolate 12 hours prior to arrival.  3. NO perfume, cologne or lotion  4. Total time is 1 to 2 hours; you may want to bring reading material for the waiting time.  5. Please report to Radiology at the [facility] Main Entrance 30 minutes early for your test.  [address] [place], [place] [address]  6. Please report to Radiology at [facility] Main Entrance, medical mall, 30 mins prior  to your test.  [address]  [place], [place]  [phone]  Diabetic Preparation:  Hold oral medications. You may take NPH and Lantus insulin. Do not take Humalog or Humulin R (Regular Insulin) the day of your test. Check blood sugars prior to leaving the house. If able to eat breakfast prior to 3 hour fasting, you may take all medications, including your insulin, Do not worry if you miss your breakfast dose of insulin - start at your next meal.  IF YOU THINK YOU MAY BE PREGNANT, OR ARE NURSING PLEASE INFORM THE TECHNOLOGIST.  In preparation for your appointment, medication and supplies will be purchased.  Appointment availability is limited, so if you need to cancel or reschedule, please call the Radiology Department at [phone] [address]) OR [phone] [facility])  24 hours in advance to avoid a cancellation fee of $100.00  What to Expect After you Arrive:  Once you arrive and check in for your appointment, you will be taken to a preparation room within the Radiology Department.  A technologist or Nurse will obtain your medical history, verify that you are correctly prepped for the exam, and explain the procedure.  Afterwards,  an IV will be started in your arm and electrodes will be placed on your skin for EKG monitoring during the stress portion of the exam. Then you will be escorted to the PET/CT scanner.  There, staff will get you positioned on the scanner and obtain  a blood pressure and EKG.  During the exam, you will continue to be connected to the EKG and blood pressure machines.  A small, safe amount of a radioactive tracer will be injected in your IV to obtain a series of pictures of your heart along with an injection of a stress agent.    After your Exam:  It is recommended that you eat a meal and drink a caffeinated beverage to counter act any effects of the stress agent.  Drink plenty of fluids for the remainder of the day and urinate frequently for the first couple  of hours after the exam.  Your doctor will inform you of your test results within 7-10 business days.  For more information and frequently asked questions, please visit our website : [email or web address]  For questions about your test or how to prepare for your test, please call: Cardiac Imaging Nurse Navigators Office: [phone]    Follow-Up: At [facility], you and your health needs are our priority.  As part of our continuing mission to provide you with exceptional heart care, we have created designated Provider Care Teams.  These Care Teams include your primary Cardiologist (physician) and Advanced Practice Providers (APPs -  Physician Assistants and Nurse Practitioners) who all work together to provide you with the care you need, when you need it.  We recommend signing up for the patient portal called "MyChart".  Sign up information is provided on this After Visit Summary.  MyChart is used to connect with patients for Virtual Visits (Telemedicine).  Patients are able to view lab/test results, encounter notes, upcoming appointments, etc.  Non-urgent messages can be sent to your provider as well.   To learn more about what you can do with MyChart, go to [email or web address].    Your next appointment:   6 month(s)  Provider:   DR Flora Lipps     Other Instructions NONE    Signed, Lenna Gilford. Flora Lipps, MD, [facility]  [facility]  [address] [place], [place] [address] [phone]  03/19/2023 9:24 AM

## 2023-03-19 ENCOUNTER — Encounter: Payer: Self-pay | Admitting: Cardiovascular Disease

## 2023-03-19 ENCOUNTER — Ambulatory Visit
Payer: No Typology Code available for payment source | Attending: Cardiovascular Disease | Admitting: Cardiovascular Disease

## 2023-03-19 VITALS — BP 120/70 | HR 60 | Ht 69.0 in | Wt 217.2 lb

## 2023-03-19 DIAGNOSIS — R0602 Shortness of breath: Secondary | ICD-10-CM | POA: Diagnosis not present

## 2023-03-19 DIAGNOSIS — R079 Chest pain, unspecified: Secondary | ICD-10-CM | POA: Diagnosis not present

## 2023-03-19 DIAGNOSIS — I471 Supraventricular tachycardia, unspecified: Secondary | ICD-10-CM | POA: Diagnosis not present

## 2023-03-19 DIAGNOSIS — R001 Bradycardia, unspecified: Secondary | ICD-10-CM | POA: Diagnosis not present

## 2023-03-19 DIAGNOSIS — R072 Precordial pain: Secondary | ICD-10-CM

## 2023-03-19 DIAGNOSIS — E782 Mixed hyperlipidemia: Secondary | ICD-10-CM

## 2023-03-19 DIAGNOSIS — I491 Atrial premature depolarization: Secondary | ICD-10-CM

## 2023-03-19 MED ORDER — ROSUVASTATIN CALCIUM 20 MG PO TABS: 20.0000 mg | ORAL_TABLET | Freq: Every day | ORAL | 3 refills | Status: DC

## 2023-03-19 NOTE — Patient Instructions (Addendum)
Medication Instructions:  START ROSUVASTATIN 20 MG AT BEDTIME  *If you need a refill on your cardiac medications before your next appointment, please call your pharmacy*   Lab Work: NONE If you have labs (blood work) drawn today and your tests are completely normal, you will receive your results only by: MyChart Message (if you have MyChart) OR A paper copy in the mail If you have any lab test that is abnormal or we need to change your treatment, we will call you to review the results.   Testing/Procedures: How to Prepare for Your Cardiac PET/CT Stress Test:  1. Please do not take these medications before your test:   Medications that may interfere with the cardiac pharmacological stress agent (ex. nitrates - including erectile dysfunction medications, isosorbide mononitrate, tamulosin or beta-blockers) the day of the exam. (Erectile dysfunction medication should be held for at least 72 hrs prior to test) Theophylline containing medications for 12 hours. Dipyridamole 48 hours prior to the test. Your remaining medications may be taken with water.  2. Nothing to eat or drink, except water, 3 hours prior to arrival time.   NO caffeine/decaffeinated products, or chocolate 12 hours prior to arrival.  3. NO perfume, cologne or lotion  4. Total time is 1 to 2 hours; you may want to bring reading material for the waiting time.  5. Please report to Radiology at the [facility] Main Entrance 30 minutes early for your test.  [address] [place], [place] [address]  6. Please report to Radiology at [facility] Main Entrance, medical mall, 30 mins prior to your test.  [address]  [place], [place]  [phone]  Diabetic Preparation:  Hold oral medications. You may take NPH and Lantus insulin. Do not take Humalog or Humulin R (Regular Insulin) the day of your test. Check blood sugars prior to leaving the house. If able to eat breakfast prior  to 3 hour fasting, you may take all medications, including your insulin, Do not worry if you miss your breakfast dose of insulin - start at your next meal.  IF YOU THINK YOU MAY BE PREGNANT, OR ARE NURSING PLEASE INFORM THE TECHNOLOGIST.  In preparation for your appointment, medication and supplies will be purchased.  Appointment availability is limited, so if you need to cancel or reschedule, please call the Radiology Department at [phone] [address]) OR [phone] [facility])  24 hours in advance to avoid a cancellation fee of $100.00  What to Expect After you Arrive:  Once you arrive and check in for your appointment, you will be taken to a preparation room within the Radiology Department.  A technologist or Nurse will obtain your medical history, verify that you are correctly prepped for the exam, and explain the procedure.  Afterwards,  an IV will be started in your arm and electrodes will be placed on your skin for EKG monitoring during the stress portion of the exam. Then you will be escorted to the PET/CT scanner.  There, staff will get you positioned on the scanner and obtain a blood pressure and EKG.  During the exam, you will continue to be connected to the EKG and blood pressure machines.  A small, safe amount of a radioactive tracer will be injected in your IV to obtain a series of pictures of your heart along with an injection of a stress agent.    After your Exam:  It is recommended that you eat a meal and drink a  caffeinated beverage to counter act any effects of the stress agent.  Drink plenty of fluids for the remainder of the day and urinate frequently for the first couple of hours after the exam.  Your doctor will inform you of your test results within 7-10 business days.  For more information and frequently asked questions, please visit our website : [email or web address]  For questions about your test or how to prepare for your test, please call: Cardiac  Imaging Nurse Navigators Office: [phone]    Follow-Up: At [facility], you and your health needs are our priority.  As part of our continuing mission to provide you with exceptional heart care, we have created designated Provider Care Teams.  These Care Teams include your primary Cardiologist (physician) and Advanced Practice Providers (APPs -  Physician Assistants and Nurse Practitioners) who all work together to provide you with the care you need, when you need it.  We recommend signing up for the patient portal called "MyChart".  Sign up information is provided on this After Visit Summary.  MyChart is used to connect with patients for Virtual Visits (Telemedicine).  Patients are able to view lab/test results, encounter notes, upcoming appointments, etc.  Non-urgent messages can be sent to your provider as well.   To learn more about what you can do with MyChart, go to [email or web address].    Your next appointment:   6 month(s)  Provider:   DR Flora Lipps     Other Instructions NONE

## 2023-04-07 ENCOUNTER — Telehealth (HOSPITAL_COMMUNITY): Payer: Self-pay | Admitting: Emergency Medicine

## 2023-04-07 NOTE — Telephone Encounter (Signed)
Reaching out to patient to offer assistance regarding upcoming cardiac imaging study; pt verbalizes understanding of appt date/time, parking situation and where to check in, pre-test NPO status and medications ordered, and verified current allergies; name and call back number provided for further questions should they arise Sara Wallace RN Navigator Cardiac Imaging [facility] [phone] office [phone] cell 

## 2023-04-08 ENCOUNTER — Ambulatory Visit
Admission: RE | Admit: 2023-04-08 | Discharge: 2023-04-08 | Disposition: A | Discharge status: Home / Self Care | Payer: No Typology Code available for payment source | Source: Ambulatory Visit | Attending: Cardiovascular Disease | Admitting: Cardiovascular Disease

## 2023-04-08 DIAGNOSIS — R072 Precordial pain: Secondary | ICD-10-CM

## 2023-04-08 MED ORDER — RUBIDIUM RB82 GENERATOR (RUBYFILL): 25.0000 | PACK | Freq: Once | INTRAVENOUS | Status: DC

## 2023-04-21 ENCOUNTER — Telehealth (HOSPITAL_COMMUNITY): Payer: Self-pay | Admitting: Emergency Medicine

## 2023-04-21 NOTE — Telephone Encounter (Signed)
Reaching out to patient to offer assistance regarding upcoming cardiac imaging study; pt verbalizes understanding of appt date/time, parking situation and where to check in, pre-test NPO status and medications ordered, and verified current allergies; name and call back number provided for further questions should they arise Sara Wallace RN Navigator Cardiac Imaging [facility] [phone] office [phone] cell 

## 2023-04-22 ENCOUNTER — Encounter: Admission: RE | Admit: 2023-04-22 | Payer: No Typology Code available for payment source | Source: Ambulatory Visit

## 2023-04-27 ENCOUNTER — Telehealth (HOSPITAL_COMMUNITY): Payer: Self-pay | Admitting: Emergency Medicine

## 2023-04-27 NOTE — Telephone Encounter (Signed)
Attempted to call patient regarding upcoming cardiac PET appointment. Left message on voicemail with name and callback number Sara Wallace RN Navigator Cardiac Imaging [facility] [phone] Office [phone] Cell  

## 2023-04-29 ENCOUNTER — Encounter
Admission: RE | Admit: 2023-04-29 | Discharge: 2023-04-29 | Disposition: A | Discharge status: Home / Self Care | Payer: No Typology Code available for payment source | Source: Ambulatory Visit | Attending: Cardiovascular Disease | Admitting: Cardiovascular Disease

## 2023-04-29 DIAGNOSIS — R072 Precordial pain: Secondary | ICD-10-CM | POA: Insufficient documentation

## 2023-04-29 LAB — NM PET CT CARDIAC PERFUSION MULTI W/ABSOLUTE BLOODFLOW
LV dias vol: 83 mL (ref 62–150)
LV sys vol: 36 mL
MBFR: 2.84
Nuc Rest EF: 50 %
Nuc Stress EF: 57 %
Peak HR: 77 {beats}/min
Rest HR: 66 {beats}/min
Rest MBF: 0.7 ml/g/min
Rest Nuclear Isotope Dose: 24.9 mCi
SRS: 1
SSS: 0
ST Depression (mm): 0 mm
Stress MBF: 1.99 ml/g/min
Stress Nuclear Isotope Dose: 24.9 mCi
TID: 1.07

## 2023-04-29 MED ORDER — REGADENOSON 0.4 MG/5ML IV SOLN
0.4000 mg | Freq: Once | INTRAVENOUS | Status: AC
  Administered 2023-04-29: 0.4 mg via INTRAVENOUS
  Filled 2023-04-29: qty 5

## 2023-04-29 MED ORDER — RUBIDIUM RB82 GENERATOR (RUBYFILL)
25.0000 | PACK | Freq: Once | INTRAVENOUS | Status: AC
  Administered 2023-04-29: 24.91 via INTRAVENOUS

## 2023-04-29 MED ORDER — RUBIDIUM RB82 GENERATOR (RUBYFILL)
25.0000 | PACK | Freq: Once | INTRAVENOUS | Status: AC
  Administered 2023-04-29: 24.86 via INTRAVENOUS

## 2023-05-07 ENCOUNTER — Other Ambulatory Visit: Payer: Self-pay

## 2023-05-07 ENCOUNTER — Ambulatory Visit (INDEPENDENT_AMBULATORY_CARE_PROVIDER_SITE_OTHER): Payer: No Typology Code available for payment source | Admitting: Internal Medicine

## 2023-05-07 ENCOUNTER — Encounter: Payer: Self-pay | Admitting: Internal Medicine

## 2023-05-07 VITALS — BP 100/70 | HR 51 | Temp 97.3°F | Ht 70.25 in | Wt 208.4 lb

## 2023-05-07 DIAGNOSIS — J310 Chronic rhinitis: Secondary | ICD-10-CM | POA: Diagnosis not present

## 2023-05-07 DIAGNOSIS — R053 Chronic cough: Secondary | ICD-10-CM | POA: Diagnosis not present

## 2023-05-07 DIAGNOSIS — J438 Other emphysema: Secondary | ICD-10-CM | POA: Diagnosis not present

## 2023-05-07 MED ORDER — FLUTICASONE PROPIONATE 50 MCG/ACT NA SUSP: 2.0000 | Freq: Every day | NASAL | 5 refills | Status: AC

## 2023-05-07 MED ORDER — AZELASTINE HCL 0.1 % NA SOLN: 2.0000 | Freq: Two times a day (BID) | NASAL | 5 refills | Status: AC | PRN
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
## 2023-05-07 NOTE — Patient Instructions (Addendum)
COPD/Chronic Cough/Current Tobacco Use - Discussed complete cessation of smoking ASAP! - Spirometry today with mixed obstruction and possible restriction.  - Continue follow up with Pulm.   - Use inhalers as they recommended: Asmanex 2 puffs twice daily and Striverdi 2 puffs daily and Albuterol 1-2 puffs as needed for coughing/wheezing/shortness of breath.   Chronic  Rhinitis: - Positive skin test 04/2023: none - Use nasal saline rinses before nose sprays such as with Neilmed Sinus Rinse.  Use distilled water.   - Use Flonase 2 sprays each nostril daily. Aim upward and outward. - Use Azelastine 1-2 sprays each nostril twice daily as needed for runny nose, drainage, sneezing, congestion. Aim upward and outward.

## 2023-05-07 NOTE — Addendum Note (Signed)
Addended by: Philipp Deputy on: 05/07/2023 05:06 PM   Modules accepted: Orders

## 2023-05-07 NOTE — Addendum Note (Signed)
Addended by: Kellie Simmering, Kalonji Zurawski on: 05/07/2023 04:51 PM   Modules accepted: Orders

## 2023-08-05 ENCOUNTER — Other Ambulatory Visit (HOSPITAL_COMMUNITY): Payer: Self-pay | Admitting: Family Medicine

## 2023-08-05 ENCOUNTER — Ambulatory Visit (HOSPITAL_COMMUNITY)
Admission: RE | Admit: 2023-08-05 | Discharge: 2023-08-05 | Disposition: A | Discharge status: Home / Self Care | Payer: No Typology Code available for payment source | Source: Ambulatory Visit | Attending: Vascular Surgery | Admitting: Vascular Surgery

## 2023-08-05 ENCOUNTER — Encounter: Payer: No Typology Code available for payment source | Admitting: Surgery

## 2023-08-05 DIAGNOSIS — I739 Peripheral vascular disease, unspecified: Secondary | ICD-10-CM | POA: Insufficient documentation

## 2023-08-05 LAB — VAS US ABI WITH/WO TBI
Left ABI: 1.16
Right ABI: 1.22

## 2023-08-06 ENCOUNTER — Ambulatory Visit: Payer: No Typology Code available for payment source | Admitting: Internal Medicine

## 2023-08-20 ENCOUNTER — Encounter: Payer: No Typology Code available for payment source | Admitting: Vascular Surgery

## 2023-08-20 ENCOUNTER — Encounter (HOSPITAL_COMMUNITY): Payer: No Typology Code available for payment source

## 2023-08-25 ENCOUNTER — Encounter: Payer: No Typology Code available for payment source | Admitting: *Deleted

## 2023-08-25 DIAGNOSIS — M5412 Radiculopathy, cervical region: Secondary | ICD-10-CM | POA: Insufficient documentation

## 2023-08-25 DIAGNOSIS — E119 Type 2 diabetes mellitus without complications: Secondary | ICD-10-CM | POA: Insufficient documentation

## 2023-08-25 DIAGNOSIS — R42 Dizziness and giddiness: Secondary | ICD-10-CM | POA: Insufficient documentation

## 2023-08-25 DIAGNOSIS — I10 Essential (primary) hypertension: Secondary | ICD-10-CM | POA: Insufficient documentation

## 2023-08-25 DIAGNOSIS — Z77098 Contact with and (suspected) exposure to other hazardous, chiefly nonmedicinal, chemicals: Secondary | ICD-10-CM | POA: Insufficient documentation

## 2023-08-25 DIAGNOSIS — H40053 Ocular hypertension, bilateral: Secondary | ICD-10-CM | POA: Insufficient documentation

## 2023-08-25 DIAGNOSIS — G4733 Obstructive sleep apnea (adult) (pediatric): Secondary | ICD-10-CM | POA: Insufficient documentation

## 2023-08-25 DIAGNOSIS — E785 Hyperlipidemia, unspecified: Secondary | ICD-10-CM | POA: Insufficient documentation

## 2023-08-25 DIAGNOSIS — K219 Gastro-esophageal reflux disease without esophagitis: Secondary | ICD-10-CM | POA: Insufficient documentation

## 2023-08-25 DIAGNOSIS — G4700 Insomnia, unspecified: Secondary | ICD-10-CM | POA: Insufficient documentation

## 2023-08-25 DIAGNOSIS — E118 Type 2 diabetes mellitus with unspecified complications: Secondary | ICD-10-CM | POA: Insufficient documentation

## 2023-08-25 DIAGNOSIS — J449 Chronic obstructive pulmonary disease, unspecified: Secondary | ICD-10-CM | POA: Insufficient documentation

## 2023-08-25 NOTE — Progress Notes (Signed)
 Initial phone call completed. Diagnosis can be found in [facility] 11/20. EP Orientation scheduled for Thursday 1/9 at 8am.

## 2023-08-26 VITALS — Ht 70.0 in | Wt 213.7 lb

## 2023-08-26 DIAGNOSIS — J449 Chronic obstructive pulmonary disease, unspecified: Secondary | ICD-10-CM

## 2023-08-26 NOTE — Patient Instructions (Addendum)
 Patient Instructions  Patient Details  Name: Robert Combs MRN: [id number] Date of Birth: 11-27-1945 Referring Provider:  Tor Jereld LABOR, MD  Below are your personal goals for exercise, nutrition, and risk factors. Our goal is to help you stay on track towards obtaining and maintaining these goals. We will be discussing your progress on these goals with you throughout the program.  Initial Exercise Prescription:  Initial Exercise Prescription - 08/26/23 1100       Date of Initial Exercise RX and Referring Provider   Date 08/26/23    Referring Provider Dr. Jereld Tor      Oxygen   Maintain Oxygen Saturation 88% or higher      Treadmill   MPH 2    Grade 0    Minutes 15    METs 2.53      NuStep   Level 2    SPM 80    Minutes 15    METs 2.49      REL-XR   Level 2    Speed 50    Minutes 15    METs 2.49      Prescription Details   Frequency (times per week) 3    Duration Progress to 30 minutes of continuous aerobic without signs/symptoms of physical distress      Intensity   THRR 40-80% of Max Heartrate 92-127    Ratings of Perceived Exertion 11-13    Perceived Dyspnea 0-4      Progression   Progression Continue to progress workloads to maintain intensity without signs/symptoms of physical distress.      Resistance Training   Training Prescription Yes    Weight 7 lb    Reps 10-15             Exercise Goals: Frequency: Be able to perform aerobic exercise two to three times per week in program working toward 2-5 days per week of home exercise.  Intensity: Work with a perceived exertion of 11 (fairly light) - 15 (hard) while following your exercise prescription.  We will make changes to your prescription with you as you progress through the program.   Duration: Be able to do 30 to 45 minutes of continuous aerobic exercise in addition to a 5 minute warm-up and a 5 minute cool-down routine.   Nutrition Goals: Your personal nutrition goals will be  established when you do your nutrition analysis with the dietician.  The following are general nutrition guidelines to follow: Cholesterol < 200mg /day Sodium < 1500mg /day Fiber: Men over 50 yrs - 30 grams per day  Personal Goals:  Personal Goals and Risk Factors at Admission - 08/25/23 1138       Core Components/Risk Factors/Patient Goals on Admission   Tobacco Cessation Yes   Quit November 2024   Intervention Offer photographer, assist with locating and accessing local/national Quit Smoking programs, and support quit date choice.;Assist the participant in steps to quit. Provide individualized education and counseling about committing to Tobacco Cessation, relapse prevention, and pharmacological support that can be provided by physician.    Expected Outcomes Short Term: Will quit all tobacco product use, adhering to prevention of relapse plan.;Long Term: Complete abstinence from all tobacco products for at least 12 months from quit date.    Improve shortness of breath with ADL's Yes    Intervention Provide education, individualized exercise plan and daily activity instruction to help decrease symptoms of SOB with activities of daily living.    Expected Outcomes Short Term: Improve cardiorespiratory fitness  to achieve a reduction of symptoms when performing ADLs;Long Term: Be able to perform more ADLs without symptoms or delay the onset of symptoms    Diabetes Yes    Intervention Provide education about signs/symptoms and action to take for hypo/hyperglycemia.;Provide education about proper nutrition, including hydration, and aerobic/resistive exercise prescription along with prescribed medications to achieve blood glucose in normal ranges: Fasting glucose 65-99 mg/dL    Expected Outcomes Short Term: Participant verbalizes understanding of the signs/symptoms and immediate care of hyper/hypoglycemia, proper foot care and importance of medication, aerobic/resistive exercise and nutrition  plan for blood glucose control.;Long Term: Attainment of HbA1C < 7%.    Hypertension Yes    Intervention Provide education on lifestyle modifcations including regular physical activity/exercise, weight management, moderate sodium restriction and increased consumption of fresh fruit, vegetables, and low fat dairy, alcohol moderation, and smoking cessation.;Monitor prescription use compliance.    Expected Outcomes Short Term: Continued assessment and intervention until BP is < 140/41mm HG in hypertensive participants. < 130/18mm HG in hypertensive participants with diabetes, heart failure or chronic kidney disease.;Long Term: Maintenance of blood pressure at goal levels.    Lipids Yes    Intervention Provide education and support for participant on nutrition & aerobic/resistive exercise along with prescribed medications to achieve LDL 70mg , HDL >40mg .    Expected Outcomes Short Term: Participant states understanding of desired cholesterol values and is compliant with medications prescribed. Participant is following exercise prescription and nutrition guidelines.;Long Term: Cholesterol controlled with medications as prescribed, with individualized exercise RX and with personalized nutrition plan. Value goals: LDL < 70mg , HDL > 40 mg.             Exercise Goals and Review:  Exercise Goals     Row Name 08/26/23 0910             Exercise Goals   Increase Physical Activity Yes       Intervention Provide advice, education, support and counseling about physical activity/exercise needs.;Develop an individualized exercise prescription for aerobic and resistive training based on initial evaluation findings, risk stratification, comorbidities and participant's personal goals.       Expected Outcomes Short Term: Attend rehab on a regular basis to increase amount of physical activity.;Long Term: Add in home exercise to make exercise part of routine and to increase amount of physical activity.;Long Term:  Exercising regularly at least 3-5 days a week.       Increase Strength and Stamina Yes       Intervention Provide advice, education, support and counseling about physical activity/exercise needs.;Develop an individualized exercise prescription for aerobic and resistive training based on initial evaluation findings, risk stratification, comorbidities and participant's personal goals.       Expected Outcomes Short Term: Increase workloads from initial exercise prescription for resistance, speed, and METs.;Short Term: Perform resistance training exercises routinely during rehab and add in resistance training at home;Long Term: Improve cardiorespiratory fitness, muscular endurance and strength as measured by increased METs and functional capacity ( )       Able to understand and use rate of perceived exertion (RPE) scale Yes       Intervention Provide education and explanation on how to use RPE scale       Expected Outcomes Short Term: Able to use RPE daily in rehab to express subjective intensity level;Long Term:  Able to use RPE to guide intensity level when exercising independently       Able to understand and use Dyspnea scale Yes  Intervention Provide education and explanation on how to use Dyspnea scale       Expected Outcomes Short Term: Able to use Dyspnea scale daily in rehab to express subjective sense of shortness of breath during exertion;Long Term: Able to use Dyspnea scale to guide intensity level when exercising independently       Knowledge and understanding of Target Heart Rate Range (THRR) Yes       Intervention Provide education and explanation of THRR including how the numbers were predicted and where they are located for reference       Expected Outcomes Short Term: Able to use daily as guideline for intensity in rehab;Short Term: Able to state/look up THRR;Long Term: Able to use THRR to govern intensity when exercising independently       Able to check pulse independently Yes        Intervention Provide education and demonstration on how to check pulse in carotid and radial arteries.;Review the importance of being able to check your own pulse for safety during independent exercise       Expected Outcomes Short Term: Able to explain why pulse checking is important during independent exercise;Long Term: Able to check pulse independently and accurately       Understanding of Exercise Prescription Yes       Intervention Provide education, explanation, and written materials on patient's individual exercise prescription       Expected Outcomes Short Term: Able to explain program exercise prescription;Long Term: Able to explain home exercise prescription to exercise independently

## 2023-08-26 NOTE — Progress Notes (Signed)
 Pulmonary Individual Treatment Plan  Patient Details  Name: Robert Combs MRN: [id number] Date of Birth: 1946-07-16 Referring Provider:   Flowsheet Row Pulmonary Rehab from 08/26/2023 in [facility] Cardiac and Pulmonary Rehab  Referring Provider Dr. Jereld Boos       Initial Encounter Date:  Flowsheet Row Pulmonary Rehab from 08/26/2023 in [facility] Cardiac and Pulmonary Rehab  Date 08/26/23       Visit Diagnosis: Chronic obstructive pulmonary disease, unspecified COPD type (HCC)  Patient's Home Medications on Admission:  Current Outpatient Medications:    albuterol (PROVENTIL HFA;VENTOLIN HFA) 108 (90 Base) MCG/ACT inhaler, Inhale 1 puff into the lungs every 6 (six) hours as needed for wheezing or shortness of breath., Disp: , Rfl:    albuterol (VENTOLIN HFA) 108 (90 Base) MCG/ACT inhaler, Inhale into the lungs., Disp: , Rfl:    Alogliptin Benzoate 25 MG TABS, Take by mouth., Disp: , Rfl:    Ascorbic Acid (VITAMIN C) 100 MG tablet, Take 100 mg by mouth daily., Disp: , Rfl:    aspirin EC 81 MG tablet, Take 81 mg by mouth daily.  , Disp: , Rfl:    atorvastatin (LIPITOR) 40 MG tablet, Take 40 mg by mouth daily., Disp: , Rfl:    azelastine  (ASTELIN ) 0.1 % nasal spray, Place 2 sprays into both nostrils 2 (two) times daily as needed for rhinitis. Use in each nostril as directed, Disp: 30 mL, Rfl: 5   budesonide-formoterol (SYMBICORT) 160-4.5 MCG/ACT inhaler, Inhale into the lungs. (Patient not taking: Reported on 08/25/2023), Disp: , Rfl:    cholecalciferol (VITAMIN D) 1000 UNITS tablet, Take 2,000 Units by mouth daily., Disp: , Rfl:    fluticasone  (FLONASE ) 50 MCG/ACT nasal spray, Place 2 sprays into both nostrils daily. (Patient not taking: Reported on 08/25/2023), Disp: 16 g, Rfl: 5   folic acid (FOLVITE) 1 MG tablet, Take 1 mg by mouth daily., Disp: , Rfl:    gabapentin (NEURONTIN) 300 MG capsule, Take 300 mg by mouth.  TAKE ONE CAPSULE BY MOUTH EVERY MORNING AND TAKE ONE CAPSULE AT NOON AND TAKE  TWO CAPSULES EVERY EVENING FOR NERVE PAIN/BURNING, Disp: , Rfl:    hydrochlorothiazide (HYDRODIURIL) 25 MG tablet, Take 1 tablet by mouth every morning., Disp: , Rfl:    losartan (COZAAR) 100 MG tablet, Take 100 mg by mouth daily., Disp: , Rfl:    Magnesium Oxide 420 MG TABS, Take 420 mg by mouth daily., Disp: , Rfl:    Meclizine HCl 25 MG CHEW, Chew by mouth.  CHEW 1-2 TABLETS BY MOUTH 1 HOUR BEFORE BEDTIME FOR DIZZINESS AND SLEEP; NO DRIVING FOR 6 HOURS AFTER TAKING, Disp: , Rfl:    metFORMIN  (GLUCOPHAGE -XR) 750 MG 24 hr tablet, Take 750 mg by mouth daily with breakfast., Disp: , Rfl:    mometasone (ASMANEX) 220 MCG/INH inhaler, Take by mouth., Disp: , Rfl:    Olodaterol HCl 2.5 MCG/ACT AERS, Take by mouth., Disp: , Rfl:    omeprazole (PRILOSEC OTC) 20 MG tablet, Take 20 mg by mouth daily. (Patient not taking: Reported on 08/25/2023), Disp: , Rfl:    predniSONE (DELTASONE) 10 MG tablet, 6-day taper; 6 pills day one, 5 pills day 2, 4 pills day 3, 3 pills on day 4, 2 pills on day 5, 1 pill on day 6. Follow package instructions., Disp: , Rfl:    rOPINIRole (REQUIP) 0.5 MG tablet, Take by mouth., Disp: , Rfl:    rosuvastatin  (CRESTOR ) 20 MG tablet, Take 1 tablet (20 mg total) by mouth  daily., Disp: 90 tablet, Rfl: 3   tamsulosin (FLOMAX) 0.4 MG CAPS capsule, Take 1 capsule by mouth daily., Disp: , Rfl:    traMADol  (ULTRAM ) 50 MG tablet, Take 1 tablet (50 mg total) by mouth every 6 (six) hours as needed., Disp: 10 tablet, Rfl: 0   vardenafil (LEVITRA) 20 MG tablet, Take 10 mg by mouth., Disp: , Rfl:   Past Medical History: Past Medical History:  Diagnosis Date   Chronic kidney disease    COPD (chronic obstructive pulmonary disease) (HCC)    Coronary artery disease    DDD (degenerative disc disease)    Diabetes mellitus without complication (HCC)    Hyperlipidemia    Hypertension     Tobacco Use: Social History   Tobacco Use  Smoking Status Former   Average packs/day: 1 pack/day for 25.0  years (25.0 ttl pk-yrs)   Types: Cigarettes   Start date: 07/12/1995   Quit date: 07/11/2020   Years since quitting: 3.1   Passive exposure: Current  Smokeless Tobacco Never  Tobacco Comments   02/03/21 currently at 1 ppd, [facility] is [facility]    Labs: Review Flowsheet       Latest Ref Rng & Units 02/03/2010 11/04/2015  Labs for [facility] Cardiac and Pulmonary Rehab  Bicarbonate 20.0 - 24.0 mEq/L - 26.3   TCO2 0 - 100 mmol/L 31  28   O2 Saturation % - 75.0      Pulmonary Assessment Scores:  Pulmonary Assessment Scores     Row Name 08/26/23 1105         ADL UCSD   ADL Phase Entry     SOB Score total 55     Rest 0     Walk 0     Stairs 5     Bath 0     Dress 0     Shop 0       CAT Score   CAT Score 26       mMRC Score   mMRC Score 1              UCSD: Self-administered rating of dyspnea associated with activities of daily living (ADLs) 6-point scale (0 = not at all to 5 = maximal or unable to do because of breathlessness)  Scoring Scores range from 0 to 120.  Minimally important difference is 5 units  CAT: CAT can identify the health impairment of COPD patients and is better correlated with disease progression.  CAT has a scoring range of zero to 40. The CAT score is classified into four groups of low (less than 10), medium (10 - 20), high (21-30) and very high (31-40) based on the impact level of disease on health status. A CAT score over 10 suggests significant symptoms.  A worsening CAT score could be explained by an exacerbation, poor medication adherence, poor inhaler technique, or progression of COPD or comorbid conditions.  CAT MCID is 2 points  mMRC: mMRC (Modified Medical Research Council) Dyspnea Scale is used to assess the degree of baseline functional disability in patients of respiratory disease due to dyspnea. No minimal important difference is established. A decrease in score of 1 point or greater is considered a positive  change.   Pulmonary Function Assessment:   Exercise Target Goals: Exercise Program Goal: Individual exercise prescription set using results from initial 6 min walk test and THRR while considering  patient's activity barriers and safety.   Exercise Prescription Goal: Initial  exercise prescription builds to 30-45 minutes a day of aerobic activity, 2-3 days per week.  Home exercise guidelines will be given to patient during program as part of exercise prescription that the participant will acknowledge.  Education: Aerobic Exercise: - Group verbal and visual presentation on the components of exercise prescription. Introduces F.I.T.T principle from ACSM for exercise prescriptions.  Reviews F.I.T.T. principles of aerobic exercise including progression. Written material given at graduation. Flowsheet Row Pulmonary Rehab from 04/17/2021 in [facility] Cardiac and Pulmonary Rehab  Date 02/06/21  Educator [facility]  Instruction Review Code 1- Verbalizes Understanding       Education: Resistance Exercise: - Group verbal and visual presentation on the components of exercise prescription. Introduces F.I.T.T principle from ACSM for exercise prescriptions  Reviews F.I.T.T. principles of resistance exercise including progression. Written material given at graduation. Flowsheet Row Pulmonary Rehab from 04/17/2021 in [facility] Cardiac and Pulmonary Rehab  Date 04/17/21  Educator [facility]  Instruction Review Code 1- Verbalizes Understanding        Education: Exercise & Equipment Safety: - Individual verbal instruction and demonstration of equipment use and safety with use of the equipment. Flowsheet Row Pulmonary Rehab from 08/26/2023 in [facility] Cardiac and Pulmonary Rehab  Date 08/26/23  Educator [facility]  Instruction Review Code 1- Verbalizes Understanding       Education: Exercise Physiology & General Exercise Guidelines: - Group verbal and written instruction with models to review the exercise physiology of the cardiovascular  system and associated critical values. Provides general exercise guidelines with specific guidelines to those with heart or lung disease.    Education: Flexibility, Balance, Mind/Body Relaxation: - Group verbal and visual presentation with interactive activity on the components of exercise prescription. Introduces F.I.T.T principle from ACSM for exercise prescriptions. Reviews F.I.T.T. principles of flexibility and balance exercise training including progression. Also discusses the mind body connection.  Reviews various relaxation techniques to help reduce and manage stress (i.e. Deep breathing, progressive muscle relaxation, and visualization). Balance handout provided to take home. Written material given at graduation. Flowsheet Row Pulmonary Rehab from 04/17/2021 in [facility] Cardiac and Pulmonary Rehab  Date 02/20/21  Educator [facility]  Instruction Review Code 1- Verbalizes Understanding       Activity Barriers & Risk Stratification:  Activity Barriers & Cardiac Risk Stratification - 08/25/23 1135       Activity Barriers & Cardiac Risk Stratification   Activity Barriers Shortness of Breath;Deconditioning;Muscular Weakness;Balance Concerns   neuropathy (right leg worse)            6 Minute Walk:  6 Minute Walk     Row Name 08/26/23 0908         6 Minute Walk   Phase Initial     Distance 1340 feet     Walk Time 6 minutes     # of Rest Breaks 0     MPH 2.54     METS 2.49     RPE 13     Perceived Dyspnea  3     VO2 Peak 8.71     Symptoms Yes (comment)     Comments leg weakness     Resting HR 58 bpm     Resting BP 124/62     Resting Oxygen Saturation  94 %     Exercise Oxygen Saturation  during 6 min walk 88 %     Max Ex. HR 91 bpm     Max Ex. BP 136/64     2 Minute Post BP 126/58  Interval HR   1 Minute HR 63     2 Minute HR 73     3 Minute HR 70     4 Minute HR 78     5 Minute HR 81     6 Minute HR 91     2 Minute Post HR 63     Interval Heart Rate? Yes        Interval Oxygen   Interval Oxygen? Yes     Baseline Oxygen Saturation % 94 %     1 Minute Oxygen Saturation % 92 %     1 Minute Liters of Oxygen 0 L  RA     2 Minute Oxygen Saturation % 90 %     2 Minute Liters of Oxygen 0 L     3 Minute Oxygen Saturation % 88 %     3 Minute Liters of Oxygen 0 L     4 Minute Oxygen Saturation % 90 %     4 Minute Liters of Oxygen 0 L     5 Minute Oxygen Saturation % 89 %     5 Minute Liters of Oxygen 0 L     6 Minute Oxygen Saturation % 88 %     6 Minute Liters of Oxygen 0 L     2 Minute Post Oxygen Saturation % 96 %     2 Minute Post Liters of Oxygen 0 L             Oxygen Initial Assessment:  Oxygen Initial Assessment - 08/25/23 1148       Home Oxygen   Home Oxygen Device None    Sleep Oxygen Prescription None    Home Exercise Oxygen Prescription None    Home Resting Oxygen Prescription None    Compliance with Home Oxygen Use Yes      Intervention   Short Term Goals To learn and understand importance of monitoring SPO2 with pulse oximeter and demonstrate accurate use of the pulse oximeter.;To learn and understand importance of maintaining oxygen saturations>88%;To learn and demonstrate proper pursed lip breathing techniques or other breathing techniques. ;To learn and demonstrate proper use of respiratory medications    Long  Term Goals Verbalizes importance of monitoring SPO2 with pulse oximeter and return demonstration;Maintenance of O2 saturations>88%;Exhibits proper breathing techniques, such as pursed lip breathing or other method taught during program session;Compliance with respiratory medication             Oxygen Re-Evaluation:   Oxygen Discharge (Final Oxygen Re-Evaluation):   Initial Exercise Prescription:  Initial Exercise Prescription - 08/26/23 1100       Date of Initial Exercise RX and Referring Provider   Date 08/26/23    Referring Provider Dr. Jereld Boos      Oxygen   Maintain Oxygen Saturation 88% or  higher      Treadmill   MPH 2    Grade 0    Minutes 15    METs 2.53      NuStep   Level 2    SPM 80    Minutes 15    METs 2.49      REL-XR   Level 2    Speed 50    Minutes 15    METs 2.49      Prescription Details   Frequency (times per week) 3    Duration Progress to 30 minutes of continuous aerobic without signs/symptoms of physical distress      Intensity  THRR 40-80% of Max Heartrate 92-127    Ratings of Perceived Exertion 11-13    Perceived Dyspnea 0-4      Progression   Progression Continue to progress workloads to maintain intensity without signs/symptoms of physical distress.      Resistance Training   Training Prescription Yes    Weight 7 lb    Reps 10-15             Perform Capillary Blood Glucose checks as needed.  Exercise Prescription Changes:   Exercise Prescription Changes     Row Name 08/26/23 1100             Response to Exercise   Blood Pressure (Admit) 124/62       Blood Pressure (Exercise) 136/64       Blood Pressure (Exit) 126/58       Heart Rate (Admit) 58 bpm       Heart Rate (Exercise) 91 bpm       Heart Rate (Exit) 63 bpm       Oxygen Saturation (Admit) 94 %       Oxygen Saturation (Exercise) 88 %       Oxygen Saturation (Exit) 96 %       Rating of Perceived Exertion (Exercise) 13       Perceived Dyspnea (Exercise) 3       Symptoms leg weakness       Comments Results                Exercise Comments:   Exercise Goals and Review:   Exercise Goals     Row Name 08/26/23 0910             Exercise Goals   Increase Physical Activity Yes       Intervention Provide advice, education, support and counseling about physical activity/exercise needs.;Develop an individualized exercise prescription for aerobic and resistive training based on initial evaluation findings, risk stratification, comorbidities and participant's personal goals.       Expected Outcomes Short Term: Attend rehab on a regular basis to  increase amount of physical activity.;Long Term: Add in home exercise to make exercise part of routine and to increase amount of physical activity.;Long Term: Exercising regularly at least 3-5 days a week.       Increase Strength and Stamina Yes       Intervention Provide advice, education, support and counseling about physical activity/exercise needs.;Develop an individualized exercise prescription for aerobic and resistive training based on initial evaluation findings, risk stratification, comorbidities and participant's personal goals.       Expected Outcomes Short Term: Increase workloads from initial exercise prescription for resistance, speed, and METs.;Short Term: Perform resistance training exercises routinely during rehab and add in resistance training at home;Long Term: Improve cardiorespiratory fitness, muscular endurance and strength as measured by increased METs and functional capacity ( )       Able to understand and use rate of perceived exertion (RPE) scale Yes       Intervention Provide education and explanation on how to use RPE scale       Expected Outcomes Short Term: Able to use RPE daily in rehab to express subjective intensity level;Long Term:  Able to use RPE to guide intensity level when exercising independently       Able to understand and use Dyspnea scale Yes       Intervention Provide education and explanation on how to use Dyspnea scale       Expected  Outcomes Short Term: Able to use Dyspnea scale daily in rehab to express subjective sense of shortness of breath during exertion;Long Term: Able to use Dyspnea scale to guide intensity level when exercising independently       Knowledge and understanding of Target Heart Rate Range (THRR) Yes       Intervention Provide education and explanation of THRR including how the numbers were predicted and where they are located for reference       Expected Outcomes Short Term: Able to use daily as guideline for intensity in  rehab;Short Term: Able to state/look up THRR;Long Term: Able to use THRR to govern intensity when exercising independently       Able to check pulse independently Yes       Intervention Provide education and demonstration on how to check pulse in carotid and radial arteries.;Review the importance of being able to check your own pulse for safety during independent exercise       Expected Outcomes Short Term: Able to explain why pulse checking is important during independent exercise;Long Term: Able to check pulse independently and accurately       Understanding of Exercise Prescription Yes       Intervention Provide education, explanation, and written materials on patient's individual exercise prescription       Expected Outcomes Short Term: Able to explain program exercise prescription;Long Term: Able to explain home exercise prescription to exercise independently                Exercise Goals Re-Evaluation :   Discharge Exercise Prescription (Final Exercise Prescription Changes):  Exercise Prescription Changes - 08/26/23 1100       Response to Exercise   Blood Pressure (Admit) 124/62    Blood Pressure (Exercise) 136/64    Blood Pressure (Exit) 126/58    Heart Rate (Admit) 58 bpm    Heart Rate (Exercise) 91 bpm    Heart Rate (Exit) 63 bpm    Oxygen Saturation (Admit) 94 %    Oxygen Saturation (Exercise) 88 %    Oxygen Saturation (Exit) 96 %    Rating of Perceived Exertion (Exercise) 13    Perceived Dyspnea (Exercise) 3    Symptoms leg weakness    Comments Results             Nutrition:  Target Goals: Understanding of nutrition guidelines, daily intake of sodium 1500mg , cholesterol 200mg , calories 30% from fat and 7% or less from saturated fats, daily to have 5 or more servings of fruits and vegetables.  Education: All About Nutrition: -Group instruction provided by verbal, written material, interactive activities, discussions, models, and posters to present  general guidelines for heart healthy nutrition including fat, fiber, MyPlate, the role of sodium in heart healthy nutrition, utilization of the nutrition label, and utilization of this knowledge for meal planning. Follow up email sent as well. Written material given at graduation.   Biometrics:  Pre Biometrics - 08/26/23 0911       Pre Biometrics   Height 5' 10 (1.778 m)    Weight 213 lb 11.2 oz (96.9 kg)    Waist Circumference 44 inches    Hip Circumference 42 inches    Waist to Hip Ratio 1.05 %    BMI (Calculated) 30.66    Single Leg Stand 8.9 seconds              Nutrition Therapy Plan and Nutrition Goals:  Nutrition Therapy & Goals - 08/26/23 1106  Nutrition Therapy   RD appointment deferred Yes      Intervention Plan   Intervention Prescribe, educate and counsel regarding individualized specific dietary modifications aiming towards targeted core components such as weight, hypertension, lipid management, diabetes, heart failure and other comorbidities.    Expected Outcomes Short Term Goal: Understand basic principles of dietary content, such as calories, fat, sodium, cholesterol and nutrients.;Short Term Goal: A plan has been developed with personal nutrition goals set during dietitian appointment.;Long Term Goal: Adherence to prescribed nutrition plan.             Nutrition Assessments:  MEDIFICTS Score Key: >=70 Need to make dietary changes  40-70 Heart Healthy Diet <= 40 Therapeutic Level Cholesterol Diet  Flowsheet Row Pulmonary Rehab from 08/26/2023 in [facility] Cardiac and Pulmonary Rehab  Picture Your Plate Total Score on Admission 52      Picture Your Plate Scores: <59 Unhealthy dietary pattern with much room for improvement. 41-50 Dietary pattern unlikely to meet recommendations for good health and room for improvement. 51-60 More healthful dietary pattern, with some room for improvement.  >60 Healthy dietary pattern, although there may be some  specific behaviors that could be improved.   Nutrition Goals Re-Evaluation:   Nutrition Goals Discharge (Final Nutrition Goals Re-Evaluation):   Psychosocial: Target Goals: Acknowledge presence or absence of significant depression and/or stress, maximize coping skills, provide positive support system. Participant is able to verbalize types and ability to use techniques and skills needed for reducing stress and depression.   Education: Stress, Anxiety, and Depression - Group verbal and visual presentation to define topics covered.  Reviews how body is impacted by stress, anxiety, and depression.  Also discusses healthy ways to reduce stress and to treat/manage anxiety and depression.  Written material given at graduation.   Education: Sleep Hygiene -Provides group verbal and written instruction about how sleep can affect your health.  Define sleep hygiene, discuss sleep cycles and impact of sleep habits. Review good sleep hygiene tips.    Initial Review & Psychosocial Screening:  Initial Psych Review & Screening - 08/25/23 1148       Initial Review   Current issues with Current Stress Concerns;Current Depression;Current Sleep Concerns    Source of Stress Concerns Chronic Illness      Family Dynamics   Good Support System? Yes   local family and friends     Barriers   Psychosocial barriers to participate in program There are no identifiable barriers or psychosocial needs.;The patient should benefit from training in stress management and relaxation.      Screening Interventions   Interventions Provide feedback about the scores to participant;To provide support and resources with identified psychosocial needs;Encouraged to exercise    Expected Outcomes Short Term goal: Utilizing psychosocial counselor, staff and physician to assist with identification of specific Stressors or current issues interfering with healing process. Setting desired goal for each stressor or current issue  identified.;Long Term Goal: Stressors or current issues are controlled or eliminated.;Short Term goal: Identification and review with participant of any Quality of Life or Depression concerns found by scoring the questionnaire.;Long Term goal: The participant improves quality of Life and PHQ9 Scores as seen by post scores and/or verbalization of changes             Quality of Life Scores:  Scores of 19 and below usually indicate a poorer quality of life in these areas.  A difference of  2-3 points is a clinically meaningful difference.  A difference of  2-3 points in the total score of the Quality of Life Index has been associated with significant improvement in overall quality of life, self-image, physical symptoms, and general health in studies assessing change in quality of life.  PHQ-9: Review Flowsheet  More data exists      08/26/2023 07/01/2021 06/03/2021 04/29/2021 04/01/2021  Depression screen PHQ 2/9  Decreased Interest 2 1 2 2 1   Down, Depressed, Hopeless 2 0 1 2 2   PHQ - 2 Score 4 1 3 4 3   Altered sleeping 3 3 3 3 3   Tired, decreased energy 3 3 1 2 2   Change in appetite 2 1 2 2 2   Feeling bad or failure about yourself  1 0 0 0 0  Trouble concentrating 2 1 2 2  0  Moving slowly or fidgety/restless 0 0 0 0 0  Suicidal thoughts 2 0 0 0 0  PHQ-9 Score 17 9 11 13 10   Difficult doing work/chores Somewhat difficult Somewhat difficult Somewhat difficult Not difficult at all Not difficult at all   Interpretation of Total Score  Total Score Depression Severity:  1-4 = Minimal depression, 5-9 = Mild depression, 10-14 = Moderate depression, 15-19 = Moderately severe depression, 20-27 = Severe depression   Psychosocial Evaluation and Intervention:  Psychosocial Evaluation - 08/25/23 1150       Psychosocial Evaluation & Interventions   Interventions Encouraged to exercise with the program and follow exercise prescription;Stress management education;Relaxation education    Comments  Decoda is returning to pulmonary rehab. He did the program a couple years ago and really enjoyed it, so he is looking forward to attending again. His breathing has gotten worse recently, which is altering his difficult sleep patterns even more. He has had sleep concerns for years and notes he is waking more frequently. He states he recently had a sleep study and was told he needs a cpap, but nothing has been given to him. He sees the [facility] next week and plans to ask about a cpap. He has a history of PTSD which he states he feels is being managed throught [facility], support from his family and friends, and setting personal goals. He admits to restarting smoking after 18 months of abstaining and thinks it was his depression that led him to restarting. However, in November he made the decision to quit again and really wants to stick to staying away from smoking. He wants to come to the program to work on his stamina, balance, and breathing    Expected Outcomes Short: attend pulmonary rehab for education and exercise. Long: develop and maintain positive self care habits.    Continue Psychosocial Services  Follow up required by staff             Psychosocial Re-Evaluation:   Psychosocial Discharge (Final Psychosocial Re-Evaluation):   Education: Education Goals: Education classes will be provided on a weekly basis, covering required topics. Participant will state understanding/return demonstration of topics presented.  Learning Barriers/Preferences:  Learning Barriers/Preferences - 08/25/23 1148       Learning Barriers/Preferences   Learning Barriers None    Learning Preferences None             General Pulmonary Education Topics:  Infection Prevention: - Provides verbal and written material to individual with discussion of infection control including proper hand washing and proper equipment cleaning during exercise session. Flowsheet Row Pulmonary Rehab from 08/26/2023 in [facility] Cardiac and  Pulmonary Rehab  Date 08/26/23  Educator NT  Instruction Review Code 1-  Verbalizes Understanding       Falls Prevention: - Provides verbal and written material to individual with discussion of falls prevention and safety. Flowsheet Row Pulmonary Rehab from 08/26/2023 in [facility] Cardiac and Pulmonary Rehab  Date 08/26/23  Educator NT  Instruction Review Code 1- Verbalizes Understanding       Chronic Lung Disease Review: - Group verbal instruction with posters, models, PowerPoint presentations and videos,  to review new updates, new respiratory medications, new advancements in procedures and treatments. Providing information on websites and 800 numbers for continued self-education. Includes information about supplement oxygen, available portable oxygen systems, continuous and intermittent flow rates, oxygen safety, concentrators, and Medicare reimbursement for oxygen. Explanation of Pulmonary Drugs, including class, frequency, complications, importance of spacers, rinsing mouth after steroid MDI's, and proper cleaning methods for nebulizers. Review of basic lung anatomy and physiology related to function, structure, and complications of lung disease. Review of risk factors. Discussion about methods for diagnosing sleep apnea and types of masks and machines for OSA. Includes a review of the use of types of environmental controls: home humidity, furnaces, filters, dust mite/pet prevention, HEPA vacuums. Discussion about weather changes, air quality and the benefits of nasal washing. Instruction on Warning signs, infection symptoms, calling MD promptly, preventive modes, and value of vaccinations. Review of effective airway clearance, coughing and/or vibration techniques. Emphasizing that all should Create an Action Plan. Written material given at graduation. Flowsheet Row Pulmonary Rehab from 08/26/2023 in [facility] Cardiac and Pulmonary Rehab  Education need identified 08/26/23       AED/CPR: - Group  verbal and written instruction with the use of models to demonstrate the basic use of the AED with the basic ABC's of resuscitation.    Anatomy and Cardiac Procedures: - Group verbal and visual presentation and models provide information about basic cardiac anatomy and function. Reviews the testing methods done to diagnose heart disease and the outcomes of the test results. Describes the treatment choices: Medical Management, Angioplasty, or Coronary Bypass Surgery for treating various heart conditions including Myocardial Infarction, Angina, Valve Disease, and Cardiac Arrhythmias.  Written material given at graduation. Flowsheet Row Pulmonary Rehab from 04/17/2021 in [facility] Cardiac and Pulmonary Rehab  Date 04/17/21  Educator KB  Instruction Review Code 1- Verbalizes Understanding       Medication Safety: - Group verbal and visual instruction to review commonly prescribed medications for heart and lung disease. Reviews the medication, class of the drug, and side effects. Includes the steps to properly store meds and maintain the prescription regimen.  Written material given at graduation. Flowsheet Row Pulmonary Rehab from 08/26/2023 in [facility] Cardiac and Pulmonary Rehab  Education need identified 08/26/23       Other: -Provides group and verbal instruction on various topics (see comments)   Knowledge Questionnaire Score:  Knowledge Questionnaire Score - 08/26/23 1104       Knowledge Questionnaire Score   Pre Score 15/18              Core Components/Risk Factors/Patient Goals at Admission:  Personal Goals and Risk Factors at Admission - 08/25/23 1138       Core Components/Risk Factors/Patient Goals on Admission   Tobacco Cessation Yes   Quit November 2024   Intervention Offer photographer, assist with locating and accessing local/national Quit Smoking programs, and support quit date choice.;Assist the participant in steps to quit. Provide individualized education and  counseling about committing to Tobacco Cessation, relapse prevention, and pharmacological support that can be provided by physician.  Expected Outcomes Short Term: Will quit all tobacco product use, adhering to prevention of relapse plan.;Long Term: Complete abstinence from all tobacco products for at least 12 months from quit date.    Improve shortness of breath with ADL's Yes    Intervention Provide education, individualized exercise plan and daily activity instruction to help decrease symptoms of SOB with activities of daily living.    Expected Outcomes Short Term: Improve cardiorespiratory fitness to achieve a reduction of symptoms when performing ADLs;Long Term: Be able to perform more ADLs without symptoms or delay the onset of symptoms    Diabetes Yes    Intervention Provide education about signs/symptoms and action to take for hypo/hyperglycemia.;Provide education about proper nutrition, including hydration, and aerobic/resistive exercise prescription along with prescribed medications to achieve blood glucose in normal ranges: Fasting glucose 65-99 mg/dL    Expected Outcomes Short Term: Participant verbalizes understanding of the signs/symptoms and immediate care of hyper/hypoglycemia, proper foot care and importance of medication, aerobic/resistive exercise and nutrition plan for blood glucose control.;Long Term: Attainment of HbA1C < 7%.    Hypertension Yes    Intervention Provide education on lifestyle modifcations including regular physical activity/exercise, weight management, moderate sodium restriction and increased consumption of fresh fruit, vegetables, and low fat dairy, alcohol moderation, and smoking cessation.;Monitor prescription use compliance.    Expected Outcomes Short Term: Continued assessment and intervention until BP is < 140/64mm HG in hypertensive participants. < 130/13mm HG in hypertensive participants with diabetes, heart failure or chronic kidney disease.;Long Term:  Maintenance of blood pressure at goal levels.    Lipids Yes    Intervention Provide education and support for participant on nutrition & aerobic/resistive exercise along with prescribed medications to achieve LDL 70mg , HDL >40mg .    Expected Outcomes Short Term: Participant states understanding of desired cholesterol values and is compliant with medications prescribed. Participant is following exercise prescription and nutrition guidelines.;Long Term: Cholesterol controlled with medications as prescribed, with individualized exercise RX and with personalized nutrition plan. Value goals: LDL < 70mg , HDL > 40 mg.             Education:Diabetes - Individual verbal and written instruction to review signs/symptoms of diabetes, desired ranges of glucose level fasting, after meals and with exercise. Acknowledge that pre and post exercise glucose checks will be done for 3 sessions at entry of program. Flowsheet Row Pulmonary Rehab from 08/26/2023 in [facility] Cardiac and Pulmonary Rehab  Date 08/25/23  Educator [facility]  Instruction Review Code 1- Verbalizes Understanding       Know Your Numbers and Heart Failure: - Group verbal and visual instruction to discuss disease risk factors for cardiac and pulmonary disease and treatment options.  Reviews associated critical values for Overweight/Obesity, Hypertension, Cholesterol, and Diabetes.  Discusses basics of heart failure: signs/symptoms and treatments.  Introduces Heart Failure Zone chart for action plan for heart failure.  Written material given at graduation.   Core Components/Risk Factors/Patient Goals Review:    Core Components/Risk Factors/Patient Goals at Discharge (Final Review):    ITP Comments:  ITP Comments     Row Name 08/25/23 1144 08/26/23 0908         ITP Comments Initial phone call completed. Diagnosis can be found in [facility] 11/20. EP Orientation scheduled for Thursday 1/9 at 8am. Completed and gym orientation. Initial ITP created  and sent for review to Dr. Faud Aleskerov, Medical Director.               Comments: Initial ITP

## 2023-08-30 ENCOUNTER — Encounter: Payer: No Typology Code available for payment source | Admitting: *Deleted

## 2023-08-30 DIAGNOSIS — J449 Chronic obstructive pulmonary disease, unspecified: Secondary | ICD-10-CM

## 2023-08-30 LAB — GLUCOSE, CAPILLARY
Glucose-Capillary: 164 mg/dL — ABNORMAL HIGH (ref 70–99)
Glucose-Capillary: 138 mg/dL — ABNORMAL HIGH (ref 70–99)

## 2023-08-30 NOTE — Progress Notes (Signed)
 Daily Session Note  Patient Details  Name: Robert Combs MRN: [id number] Date of Birth: February 02, 1946 Referring Provider:   Flowsheet Row Pulmonary Rehab from 08/26/2023 in [facility] Cardiac and Pulmonary Rehab  Referring Provider Dr. Jereld Boos       Encounter Date: 08/30/2023  Check In:  Session Check In - 08/30/23 1723       Check-In   Supervising physician immediately available to respond to emergencies See telemetry face sheet for immediately available ER MD    Location [facility]-Cardiac & Pulmonary Rehab    Staff Present Hoy Rodney, RN BSN;Joseph [place], RCP,RRT,BSRT;Kelly [place], [place], ACSM CEP, Exercise Physiologist;Laureen Delores, [place], RRT, CPFT    Virtual Visit No    Medication changes reported     No    Fall or balance concerns reported    No    Tobacco Cessation No Change    Current number of cigarettes/nicotine per day     0    Warm-up and Cool-down Performed on first and last piece of equipment    Resistance Training Performed Yes    VAD Patient? No    PAD/SET Patient? No      Pain Assessment   Currently in Pain? No/denies                Social History   Tobacco Use  Smoking Status Former   Average packs/day: 1 pack/day for 25.0 years (25.0 ttl pk-yrs)   Types: Cigarettes   Start date: 07/12/1995   Quit date: 07/11/2020   Years since quitting: 3.1   Passive exposure: Current  Smokeless Tobacco Never  Tobacco Comments   02/03/21 currently at 1 ppd, [facility] and [facility] is [facility] it    Goals Met:  Independence with exercise equipment Exercise tolerated well No report of concerns or symptoms today Strength training completed today  Goals Unmet:  Not Applicable  Comments: First full day of exercise!  Patient was oriented to gym and equipment including functions, settings, policies, and procedures.  Patient's individual exercise prescription and treatment plan were reviewed.  All starting workloads were established based on the results of the  6 minute walk test done at initial orientation visit.  The plan for exercise progression was also introduced and progression will be customized based on patient's performance and goals.     Dr. Oneil Pinal is Medical Director for [facility] Cardiac Rehabilitation.  Dr. Fuad Aleskerov is Medical Director for [facility] Pulmonary Rehabilitation.

## 2023-09-01 ENCOUNTER — Encounter: Payer: No Typology Code available for payment source | Admitting: *Deleted

## 2023-09-01 DIAGNOSIS — J449 Chronic obstructive pulmonary disease, unspecified: Secondary | ICD-10-CM | POA: Diagnosis not present

## 2023-09-01 LAB — GLUCOSE, CAPILLARY
Glucose-Capillary: 291 mg/dL — ABNORMAL HIGH (ref 70–99)
Glucose-Capillary: 238 mg/dL — ABNORMAL HIGH (ref 70–99)

## 2023-09-01 NOTE — Progress Notes (Signed)
 Pulmonary Individual Treatment Plan  Patient Details  Name: Robert Combs MRN: [id number] Date of Birth: 02-07-1946 Referring Provider:   Flowsheet Row Pulmonary Rehab from 08/26/2023 in [facility] Cardiac and Pulmonary Rehab  Referring Provider Dr. Artie Laster       Initial Encounter Date:  Flowsheet Row Pulmonary Rehab from 08/26/2023 in [facility] Cardiac and Pulmonary Rehab  Date 08/26/23       Visit Diagnosis: Chronic obstructive pulmonary disease, unspecified COPD type (HCC)  Patient's Home Medications on Admission:  Current Outpatient Medications:    albuterol (PROVENTIL HFA;VENTOLIN HFA) 108 (90 Base) MCG/ACT inhaler, Inhale 1 puff into the lungs every 6 (six) hours as needed for wheezing or shortness of breath., Disp: , Rfl:    albuterol (VENTOLIN HFA) 108 (90 Base) MCG/ACT inhaler, Inhale into the lungs., Disp: , Rfl:    Alogliptin Benzoate 25 MG TABS, Take by mouth., Disp: , Rfl:    Ascorbic Acid (VITAMIN C) 100 MG tablet, Take 100 mg by mouth daily., Disp: , Rfl:    aspirin EC 81 MG tablet, Take 81 mg by mouth daily.  , Disp: , Rfl:    atorvastatin (LIPITOR) 40 MG tablet, Take 40 mg by mouth daily., Disp: , Rfl:    azelastine  (ASTELIN ) 0.1 % nasal spray, Place 2 sprays into both nostrils 2 (two) times daily as needed for rhinitis. Use in each nostril as directed, Disp: 30 mL, Rfl: 5   budesonide-formoterol (SYMBICORT) 160-4.5 MCG/ACT inhaler, Inhale into the lungs. (Patient not taking: Reported on 08/25/2023), Disp: , Rfl:    cholecalciferol (VITAMIN D) 1000 UNITS tablet, Take 2,000 Units by mouth daily., Disp: , Rfl:    fluticasone  (FLONASE ) 50 MCG/ACT nasal spray, Place 2 sprays into both nostrils daily. (Patient not taking: Reported on 08/25/2023), Disp: 16 g, Rfl: 5   folic acid (FOLVITE) 1 MG tablet, Take 1 mg by mouth daily., Disp: , Rfl:    gabapentin (NEURONTIN) 300 MG capsule, Take 300 mg by mouth.  TAKE ONE CAPSULE BY MOUTH EVERY MORNING AND TAKE ONE CAPSULE AT NOON AND TAKE  TWO CAPSULES EVERY EVENING FOR NERVE PAIN/BURNING, Disp: , Rfl:    hydrochlorothiazide (HYDRODIURIL) 25 MG tablet, Take 1 tablet by mouth every morning., Disp: , Rfl:    losartan (COZAAR) 100 MG tablet, Take 100 mg by mouth daily., Disp: , Rfl:    Magnesium Oxide 420 MG TABS, Take 420 mg by mouth daily., Disp: , Rfl:    Meclizine HCl 25 MG CHEW, Chew by mouth.  CHEW 1-2 TABLETS BY MOUTH 1 HOUR BEFORE BEDTIME FOR DIZZINESS AND SLEEP; NO DRIVING FOR 6 HOURS AFTER TAKING, Disp: , Rfl:    metFORMIN  (GLUCOPHAGE -XR) 750 MG 24 hr tablet, Take 750 mg by mouth daily with breakfast., Disp: , Rfl:    mometasone (ASMANEX) 220 MCG/INH inhaler, Take by mouth., Disp: , Rfl:    Olodaterol HCl 2.5 MCG/ACT AERS, Take by mouth., Disp: , Rfl:    omeprazole (PRILOSEC OTC) 20 MG tablet, Take 20 mg by mouth daily. (Patient not taking: Reported on 08/25/2023), Disp: , Rfl:    predniSONE (DELTASONE) 10 MG tablet, 6-day taper; 6 pills day one, 5 pills day 2, 4 pills day 3, 3 pills on day 4, 2 pills on day 5, 1 pill on day 6. Follow package instructions., Disp: , Rfl:    rOPINIRole (REQUIP) 0.5 MG tablet, Take by mouth., Disp: , Rfl:    rosuvastatin  (CRESTOR ) 20 MG tablet, Take 1 tablet (20 mg total) by mouth  daily., Disp: 90 tablet, Rfl: 3   tamsulosin (FLOMAX) 0.4 MG CAPS capsule, Take 1 capsule by mouth daily., Disp: , Rfl:    traMADol  (ULTRAM ) 50 MG tablet, Take 1 tablet (50 mg total) by mouth every 6 (six) hours as needed., Disp: 10 tablet, Rfl: 0   vardenafil (LEVITRA) 20 MG tablet, Take 10 mg by mouth., Disp: , Rfl:   Past Medical History: Past Medical History:  Diagnosis Date   Chronic kidney disease    COPD (chronic obstructive pulmonary disease) (HCC)    Coronary artery disease    DDD (degenerative disc disease)    Diabetes mellitus without complication (HCC)    Hyperlipidemia    Hypertension     Tobacco Use: Social History   Tobacco Use  Smoking Status Former   Average packs/day: 1 pack/day for 25.0  years (25.0 ttl pk-yrs)   Types: Cigarettes   Start date: 07/12/1995   Quit date: 07/11/2020   Years since quitting: 3.1   Passive exposure: Current  Smokeless Tobacco Never  Tobacco Comments   02/03/21 currently at 1 ppd, [facility] and [facility] is [facility] it    Labs: Review Flowsheet       Latest Ref Rng & Units 02/03/2010 11/04/2015  Labs for [facility] Cardiac and Pulmonary Rehab  Bicarbonate 20.0 - 24.0 mEq/L - 26.3   TCO2 0 - 100 mmol/L 31  28   O2 Saturation % - 75.0      Pulmonary Assessment Scores:  Pulmonary Assessment Scores     Row Name 08/26/23 1105         ADL UCSD   ADL Phase Entry     SOB Score total 55     Rest 0     Walk 0     Stairs 5     Bath 0     Dress 0     Shop 0       CAT Score   CAT Score 26       mMRC Score   mMRC Score 1              UCSD: Self-administered rating of dyspnea associated with activities of daily living (ADLs) 6-point scale (0 = "not at all" to 5 = "maximal or unable to do because of breathlessness")  Scoring Scores range from 0 to 120.  Minimally important difference is 5 units  CAT: CAT can identify the health impairment of COPD patients and is better correlated with disease progression.  CAT has a scoring range of zero to 40. The CAT score is classified into four groups of low (less than 10), medium (10 - 20), high (21-30) and very high (31-40) based on the impact level of disease on health status. A CAT score over 10 suggests significant symptoms.  A worsening CAT score could be explained by an exacerbation, poor medication adherence, poor inhaler technique, or progression of COPD or comorbid conditions.  CAT MCID is 2 points  mMRC: mMRC (Modified Medical Research Council) Dyspnea Scale is used to assess the degree of baseline functional disability in patients of respiratory disease due to dyspnea. No minimal important difference is established. A decrease in score of 1 point or greater is considered a positive  change.   Pulmonary Function Assessment:   Exercise Target Goals: Exercise Program Goal: Individual exercise prescription set using results from initial 6 min walk test and THRR while considering  patient's activity barriers and safety.   Exercise Prescription Goal: Initial  exercise prescription builds to 30-45 minutes a day of aerobic activity, 2-3 days per week.  Home exercise guidelines will be given to patient during program as part of exercise prescription that the participant will acknowledge.  Education: Aerobic Exercise: - Group verbal and visual presentation on the components of exercise prescription. Introduces F.I.T.T principle from ACSM for exercise prescriptions.  Reviews F.I.T.T. principles of aerobic exercise including progression. Written material given at graduation. Flowsheet Row Pulmonary Rehab from 04/17/2021 in [facility] Cardiac and Pulmonary Rehab  Date 02/06/21  Educator [facility]  Instruction Review Code 1- Verbalizes Understanding       Education: Resistance Exercise: - Group verbal and visual presentation on the components of exercise prescription. Introduces F.I.T.T principle from ACSM for exercise prescriptions  Reviews F.I.T.T. principles of resistance exercise including progression. Written material given at graduation. Flowsheet Row Pulmonary Rehab from 04/17/2021 in [facility] Cardiac and Pulmonary Rehab  Date 04/17/21  Educator [facility]  Instruction Review Code 1- Verbalizes Understanding        Education: Exercise & Equipment Safety: - Individual verbal instruction and demonstration of equipment use and safety with use of the equipment. Flowsheet Row Pulmonary Rehab from 08/26/2023 in [facility] Cardiac and Pulmonary Rehab  Date 08/26/23  Educator NT  Instruction Review Code 1- Verbalizes Understanding       Education: Exercise Physiology & General Exercise Guidelines: - Group verbal and written instruction with models to review the exercise physiology of the cardiovascular  system and associated critical values. Provides general exercise guidelines with specific guidelines to those with heart or lung disease.    Education: Flexibility, Balance, Mind/Body Relaxation: - Group verbal and visual presentation with interactive activity on the components of exercise prescription. Introduces F.I.T.T principle from ACSM for exercise prescriptions. Reviews F.I.T.T. principles of flexibility and balance exercise training including progression. Also discusses the mind body connection.  Reviews various relaxation techniques to help reduce and manage stress (i.e. Deep breathing, progressive muscle relaxation, and visualization). Balance handout provided to take home. Written material given at graduation. Flowsheet Row Pulmonary Rehab from 04/17/2021 in [facility] Cardiac and Pulmonary Rehab  Date 02/20/21  Educator [facility]  Instruction Review Code 1- Verbalizes Understanding       Activity Barriers & Risk Stratification:  Activity Barriers & Cardiac Risk Stratification - 08/25/23 1135       Activity Barriers & Cardiac Risk Stratification   Activity Barriers Shortness of Breath;Deconditioning;Muscular Weakness;Balance Concerns   neuropathy (right leg worse)            6 Minute Walk:  6 Minute Walk     Row Name 08/26/23 0908         6 Minute Walk   Phase Initial     Distance 1340 feet     Walk Time 6 minutes     # of Rest Breaks 0     MPH 2.54     METS 2.49     RPE 13     Perceived Dyspnea  3     VO2 Peak 8.71     Symptoms Yes (comment)     Comments leg weakness     Resting HR 58 bpm     Resting BP 124/62     Resting Oxygen Saturation  94 %     Exercise Oxygen Saturation  during 6 min walk 88 %     Max Ex. HR 91 bpm     Max Ex. BP 136/64     2 Minute Post BP 126/58  Interval HR   1 Minute HR 63     2 Minute HR 73     3 Minute HR 70     4 Minute HR 78     5 Minute HR 81     6 Minute HR 91     2 Minute Post HR 63     Interval Heart Rate? Yes        Interval Oxygen   Interval Oxygen? Yes     Baseline Oxygen Saturation % 94 %     1 Minute Oxygen Saturation % 92 %     1 Minute Liters of Oxygen 0 L  RA     2 Minute Oxygen Saturation % 90 %     2 Minute Liters of Oxygen 0 L     3 Minute Oxygen Saturation % 88 %     3 Minute Liters of Oxygen 0 L     4 Minute Oxygen Saturation % 90 %     4 Minute Liters of Oxygen 0 L     5 Minute Oxygen Saturation % 89 %     5 Minute Liters of Oxygen 0 L     6 Minute Oxygen Saturation % 88 %     6 Minute Liters of Oxygen 0 L     2 Minute Post Oxygen Saturation % 96 %     2 Minute Post Liters of Oxygen 0 L             Oxygen Initial Assessment:  Oxygen Initial Assessment - 08/25/23 1148       Home Oxygen   Home Oxygen Device None    Sleep Oxygen Prescription None    Home Exercise Oxygen Prescription None    Home Resting Oxygen Prescription None    Compliance with Home Oxygen Use Yes      Intervention   Short Term Goals To learn and understand importance of monitoring SPO2 with pulse oximeter and demonstrate accurate use of the pulse oximeter.;To learn and understand importance of maintaining oxygen saturations>88%;To learn and demonstrate proper pursed lip breathing techniques or other breathing techniques. ;To learn and demonstrate proper use of respiratory medications    Long  Term Goals Verbalizes importance of monitoring SPO2 with pulse oximeter and return demonstration;Maintenance of O2 saturations>88%;Exhibits proper breathing techniques, such as pursed lip breathing or other method taught during program session;Compliance with respiratory medication             Oxygen Re-Evaluation:  Oxygen Re-Evaluation     Row Name 08/30/23 1729             Goals/Expected Outcomes   Comments Reviewed PLB technique with pt.  Talked about how it works and it's importance in maintaining their exercise saturations.       Goals/Expected Outcomes Short: Become more profiecient at using PLB.    Long: Become independent at using PLB.                Oxygen Discharge (Final Oxygen Re-Evaluation):  Oxygen Re-Evaluation - 08/30/23 1729       Goals/Expected Outcomes   Comments Reviewed PLB technique with pt.  Talked about how it works and it's importance in maintaining their exercise saturations.    Goals/Expected Outcomes Short: Become more profiecient at using PLB.   Long: Become independent at using PLB.             Initial Exercise Prescription:  Initial Exercise Prescription - 08/26/23 1100  Date of Initial Exercise RX and Referring Provider   Date 08/26/23    Referring Provider Dr. Artie Laster      Oxygen   Maintain Oxygen Saturation 88% or higher      Treadmill   MPH 2    Grade 0    Minutes 15    METs 2.53      NuStep   Level 2    SPM 80    Minutes 15    METs 2.49      REL-XR   Level 2    Speed 50    Minutes 15    METs 2.49      Prescription Details   Frequency (times per week) 3    Duration Progress to 30 minutes of continuous aerobic without signs/symptoms of physical distress      Intensity   THRR 40-80% of Max Heartrate 92-127    Ratings of Perceived Exertion 11-13    Perceived Dyspnea 0-4      Progression   Progression Continue to progress workloads to maintain intensity without signs/symptoms of physical distress.      Resistance Training   Training Prescription Yes    Weight 7 lb    Reps 10-15             Perform Capillary Blood Glucose checks as needed.  Exercise Prescription Changes:   Exercise Prescription Changes     Row Name 08/26/23 1100             Response to Exercise   Blood Pressure (Admit) 124/62       Blood Pressure (Exercise) 136/64       Blood Pressure (Exit) 126/58       Heart Rate (Admit) 58 bpm       Heart Rate (Exercise) 91 bpm       Heart Rate (Exit) 63 bpm       Oxygen Saturation (Admit) 94 %       Oxygen Saturation (Exercise) 88 %       Oxygen Saturation (Exit) 96 %        Rating of Perceived Exertion (Exercise) 13       Perceived Dyspnea (Exercise) 3       Symptoms leg weakness       Comments Results                Exercise Comments:   Exercise Comments     Row Name 08/30/23 1728           Exercise Comments First full day of exercise!  Patient was oriented to gym and equipment including functions, settings, policies, and procedures.  Patient's individual exercise prescription and treatment plan were reviewed.  All starting workloads were established based on the results of the 6 minute walk test done at initial orientation visit.  The plan for exercise progression was also introduced and progression will be customized based on patient's performance and goals.                Exercise Goals and Review:   Exercise Goals     Row Name 08/26/23 0910             Exercise Goals   Increase Physical Activity Yes       Intervention Provide advice, education, support and counseling about physical activity/exercise needs.;Develop an individualized exercise prescription for aerobic and resistive training based on initial evaluation findings, risk stratification, comorbidities and participant's personal goals.  Expected Outcomes Short Term: Attend rehab on a regular basis to increase amount of physical activity.;Long Term: Add in home exercise to make exercise part of routine and to increase amount of physical activity.;Long Term: Exercising regularly at least 3-5 days a week.       Increase Strength and Stamina Yes       Intervention Provide advice, education, support and counseling about physical activity/exercise needs.;Develop an individualized exercise prescription for aerobic and resistive training based on initial evaluation findings, risk stratification, comorbidities and participant's personal goals.       Expected Outcomes Short Term: Increase workloads from initial exercise prescription for resistance, speed, and METs.;Short Term:  Perform resistance training exercises routinely during rehab and add in resistance training at home;Long Term: Improve cardiorespiratory fitness, muscular endurance and strength as measured by increased METs and functional capacity ( )       Able to understand and use rate of perceived exertion (RPE) scale Yes       Intervention Provide education and explanation on how to use RPE scale       Expected Outcomes Short Term: Able to use RPE daily in rehab to express subjective intensity level;Long Term:  Able to use RPE to guide intensity level when exercising independently       Able to understand and use Dyspnea scale Yes       Intervention Provide education and explanation on how to use Dyspnea scale       Expected Outcomes Short Term: Able to use Dyspnea scale daily in rehab to express subjective sense of shortness of breath during exertion;Long Term: Able to use Dyspnea scale to guide intensity level when exercising independently       Knowledge and understanding of Target Heart Rate Range (THRR) Yes       Intervention Provide education and explanation of THRR including how the numbers were predicted and where they are located for reference       Expected Outcomes Short Term: Able to use daily as guideline for intensity in rehab;Short Term: Able to state/look up THRR;Long Term: Able to use THRR to govern intensity when exercising independently       Able to check pulse independently Yes       Intervention Provide education and demonstration on how to check pulse in carotid and radial arteries.;Review the importance of being able to check your own pulse for safety during independent exercise       Expected Outcomes Short Term: Able to explain why pulse checking is important during independent exercise;Long Term: Able to check pulse independently and accurately       Understanding of Exercise Prescription Yes       Intervention Provide education, explanation, and written materials on patient's  individual exercise prescription       Expected Outcomes Short Term: Able to explain program exercise prescription;Long Term: Able to explain home exercise prescription to exercise independently                Exercise Goals Re-Evaluation :  Exercise Goals Re-Evaluation     Row Name 08/30/23 1728             Exercise Goal Re-Evaluation   Exercise Goals Review Able to understand and use rate of perceived exertion (RPE) scale;Increase Physical Activity;Knowledge and understanding of Target Heart Rate Range (THRR);Understanding of Exercise Prescription;Increase Strength and Stamina;Able to check pulse independently       Comments Reviewed RPE and dyspnea scale, THR and program prescription with pt  today.  Pt voiced understanding and was given a copy of goals to take home.       Expected Outcomes Short: Use RPE daily to regulate intensity.  Long: Follow program prescription in THR.                Discharge Exercise Prescription (Final Exercise Prescription Changes):  Exercise Prescription Changes - 08/26/23 1100       Response to Exercise   Blood Pressure (Admit) 124/62    Blood Pressure (Exercise) 136/64    Blood Pressure (Exit) 126/58    Heart Rate (Admit) 58 bpm    Heart Rate (Exercise) 91 bpm    Heart Rate (Exit) 63 bpm    Oxygen Saturation (Admit) 94 %    Oxygen Saturation (Exercise) 88 %    Oxygen Saturation (Exit) 96 %    Rating of Perceived Exertion (Exercise) 13    Perceived Dyspnea (Exercise) 3    Symptoms leg weakness    Comments Results             Nutrition:  Target Goals: Understanding of nutrition guidelines, daily intake of sodium 1500mg , cholesterol 200mg , calories 30% from fat and 7% or less from saturated fats, daily to have 5 or more servings of fruits and vegetables.  Education: All About Nutrition: -Group instruction provided by verbal, written material, interactive activities, discussions, models, and posters to present general  guidelines for heart healthy nutrition including fat, fiber, MyPlate, the role of sodium in heart healthy nutrition, utilization of the nutrition label, and utilization of this knowledge for meal planning. Follow up email sent as well. Written material given at graduation.   Biometrics:  Pre Biometrics - 08/26/23 0911       Pre Biometrics   Height 5\' 10"  (1.778 m)    Weight 213 lb 11.2 oz (96.9 kg)    Waist Circumference 44 inches    Hip Circumference 42 inches    Waist to Hip Ratio 1.05 %    BMI (Calculated) 30.66    Single Leg Stand 8.9 seconds              Nutrition Therapy Plan and Nutrition Goals:  Nutrition Therapy & Goals - 08/26/23 1106       Nutrition Therapy   RD appointment deferred Yes      Intervention Plan   Intervention Prescribe, educate and counsel regarding individualized specific dietary modifications aiming towards targeted core components such as weight, hypertension, lipid management, diabetes, heart failure and other comorbidities.    Expected Outcomes Short Term Goal: Understand basic principles of dietary content, such as calories, fat, sodium, cholesterol and nutrients.;Short Term Goal: A plan has been developed with personal nutrition goals set during dietitian appointment.;Long Term Goal: Adherence to prescribed nutrition plan.             Nutrition Assessments:  MEDIFICTS Score Key: >=70 Need to make dietary changes  40-70 Heart Healthy Diet <= 40 Therapeutic Level Cholesterol Diet  Flowsheet Row Pulmonary Rehab from 08/26/2023 in [facility] Cardiac and Pulmonary Rehab  Picture Your Plate Total Score on Admission 52      Picture Your Plate Scores: <84 Unhealthy dietary pattern with much room for improvement. 41-50 Dietary pattern unlikely to meet recommendations for good health and room for improvement. 51-60 More healthful dietary pattern, with some room for improvement.  >60 Healthy dietary pattern, although there may be some specific  behaviors that could be improved.   Nutrition Goals Re-Evaluation:   Nutrition Goals  Discharge (Final Nutrition Goals Re-Evaluation):   Psychosocial: Target Goals: Acknowledge presence or absence of significant depression and/or stress, maximize coping skills, provide positive support system. Participant is able to verbalize types and ability to use techniques and skills needed for reducing stress and depression.   Education: Stress, Anxiety, and Depression - Group verbal and visual presentation to define topics covered.  Reviews how body is impacted by stress, anxiety, and depression.  Also discusses healthy ways to reduce stress and to treat/manage anxiety and depression.  Written material given at graduation.   Education: Sleep Hygiene -Provides group verbal and written instruction about how sleep can affect your health.  Define sleep hygiene, discuss sleep cycles and impact of sleep habits. Review good sleep hygiene tips.    Initial Review & Psychosocial Screening:  Initial Psych Review & Screening - 08/25/23 1148       Initial Review   Current issues with Current Stress Concerns;Current Depression;Current Sleep Concerns    Source of Stress Concerns Chronic Illness      Family Dynamics   Good Support System? Yes   local family and friends     Barriers   Psychosocial barriers to participate in program There are no identifiable barriers or psychosocial needs.;The patient should benefit from training in stress management and relaxation.      Screening Interventions   Interventions Provide feedback about the scores to participant;To provide support and resources with identified psychosocial needs;Encouraged to exercise    Expected Outcomes Short Term goal: Utilizing psychosocial counselor, staff and physician to assist with identification of specific Stressors or current issues interfering with healing process. Setting desired goal for each stressor or current issue  identified.;Long Term Goal: Stressors or current issues are controlled or eliminated.;Short Term goal: Identification and review with participant of any Quality of Life or Depression concerns found by scoring the questionnaire.;Long Term goal: The participant improves quality of Life and PHQ9 Scores as seen by post scores and/or verbalization of changes             Quality of Life Scores:  Scores of 19 and below usually indicate a poorer quality of life in these areas.  A difference of  2-3 points is a clinically meaningful difference.  A difference of 2-3 points in the total score of the Quality of Life Index has been associated with significant improvement in overall quality of life, self-image, physical symptoms, and general health in studies assessing change in quality of life.  PHQ-9: Review Flowsheet  More data exists      08/26/2023 07/01/2021 06/03/2021 04/29/2021 04/01/2021  Depression screen PHQ 2/9  Decreased Interest 2 1 2 2 1   Down, Depressed, Hopeless 2 0 1 2 2   PHQ - 2 Score 4 1 3 4 3   Altered sleeping 3 3 3 3 3   Tired, decreased energy 3 3 1 2 2   Change in appetite 2 1 2 2 2   Feeling bad or failure about yourself  1 0 0 0 0  Trouble concentrating 2 1 2 2  0  Moving slowly or fidgety/restless 0 0 0 0 0  Suicidal thoughts 2 0 0 0 0  PHQ-9 Score 17 9 11 13 10   Difficult doing work/chores Somewhat difficult Somewhat difficult Somewhat difficult Not difficult at all Not difficult at all   Interpretation of Total Score  Total Score Depression Severity:  1-4 = Minimal depression, 5-9 = Mild depression, 10-14 = Moderate depression, 15-19 = Moderately severe depression, 20-27 = Severe depression  Psychosocial Evaluation and Intervention:  Psychosocial Evaluation - 08/25/23 1150       Psychosocial Evaluation & Interventions   Interventions Encouraged to exercise with the program and follow exercise prescription;Stress management education;Relaxation education    Comments  Yuven is returning to pulmonary rehab. He did the program a couple years ago and really enjoyed it, so he is looking forward to attending again. His breathing has gotten worse recently, which is altering his difficult sleep patterns even more. He has had sleep concerns for years and notes he is waking more frequently. He states he recently had a sleep study and was told he needs a cpap, but nothing has been given to him. He sees the [place] next week and plans to ask about a cpap. He has a history of PTSD which he states he feels is being managed throught [place], support from his family and friends, and setting personal goals. He admits to restarting smoking after 18 months of abstaining and thinks it was his depression that led him to restarting. However, in November he made the decision to quit again and really wants to stick to staying away from smoking. He wants to come to the program to work on his stamina, balance, and breathing    Expected Outcomes Short: attend pulmonary rehab for education and exercise. Long: develop and maintain positive self care habits.    Continue Psychosocial Services  Follow up required by staff             Psychosocial Re-Evaluation:   Psychosocial Discharge (Final Psychosocial Re-Evaluation):   Education: Education Goals: Education classes will be provided on a weekly basis, covering required topics. Participant will state understanding/return demonstration of topics presented.  Learning Barriers/Preferences:  Learning Barriers/Preferences - 08/25/23 1148       Learning Barriers/Preferences   Learning Barriers None    Learning Preferences None             General Pulmonary Education Topics:  Infection Prevention: - Provides verbal and written material to individual with discussion of infection control including proper hand washing and proper equipment cleaning during exercise session. Flowsheet Row Pulmonary Rehab from 08/26/2023 in [facility] Cardiac and  Pulmonary Rehab  Date 08/26/23  Educator NT  Instruction Review Code 1- Verbalizes Understanding       Falls Prevention: - Provides verbal and written material to individual with discussion of falls prevention and safety. Flowsheet Row Pulmonary Rehab from 08/26/2023 in [facility] Cardiac and Pulmonary Rehab  Date 08/26/23  Educator NT  Instruction Review Code 1- Verbalizes Understanding       Chronic Lung Disease Review: - Group verbal instruction with posters, models, PowerPoint presentations and videos,  to review new updates, new respiratory medications, new advancements in procedures and treatments. Providing information on websites and "800" numbers for continued self-education. Includes information about supplement oxygen, available portable oxygen systems, continuous and intermittent flow rates, oxygen safety, concentrators, and Medicare reimbursement for oxygen. Explanation of Pulmonary Drugs, including class, frequency, complications, importance of spacers, rinsing mouth after steroid MDI's, and proper cleaning methods for nebulizers. Review of basic lung anatomy and physiology related to function, structure, and complications of lung disease. Review of risk factors. Discussion about methods for diagnosing sleep apnea and types of masks and machines for OSA. Includes a review of the use of types of environmental controls: home humidity, furnaces, filters, dust mite/pet prevention, HEPA vacuums. Discussion about weather changes, air quality and the benefits of nasal washing. Instruction on Warning signs, infection symptoms, calling  MD promptly, preventive modes, and value of vaccinations. Review of effective airway clearance, coughing and/or vibration techniques. Emphasizing that all should Create an Action Plan. Written material given at graduation. Flowsheet Row Pulmonary Rehab from 08/26/2023 in [facility] Cardiac and Pulmonary Rehab  Education need identified 08/26/23       AED/CPR: - Group  verbal and written instruction with the use of models to demonstrate the basic use of the AED with the basic ABC's of resuscitation.    Anatomy and Cardiac Procedures: - Group verbal and visual presentation and models provide information about basic cardiac anatomy and function. Reviews the testing methods done to diagnose heart disease and the outcomes of the test results. Describes the treatment choices: Medical Management, Angioplasty, or Coronary Bypass Surgery for treating various heart conditions including Myocardial Infarction, Angina, Valve Disease, and Cardiac Arrhythmias.  Written material given at graduation. Flowsheet Row Pulmonary Rehab from 04/17/2021 in [facility] Cardiac and Pulmonary Rehab  Date 04/17/21  Educator KB  Instruction Review Code 1- Verbalizes Understanding       Medication Safety: - Group verbal and visual instruction to review commonly prescribed medications for heart and lung disease. Reviews the medication, class of the drug, and side effects. Includes the steps to properly store meds and maintain the prescription regimen.  Written material given at graduation. Flowsheet Row Pulmonary Rehab from 08/26/2023 in [facility] Cardiac and Pulmonary Rehab  Education need identified 08/26/23       Other: -Provides group and verbal instruction on various topics (see comments)   Knowledge Questionnaire Score:  Knowledge Questionnaire Score - 08/26/23 1104       Knowledge Questionnaire Score   Pre Score 15/18              Core Components/Risk Factors/Patient Goals at Admission:  Personal Goals and Risk Factors at Admission - 08/25/23 1138       Core Components/Risk Factors/Patient Goals on Admission   Tobacco Cessation Yes   Quit November 2024   Intervention Offer Photographer, assist with locating and accessing local/national Quit Smoking programs, and support quit date choice.;Assist the participant in steps to quit. Provide individualized education and  counseling about committing to Tobacco Cessation, relapse prevention, and pharmacological support that can be provided by physician.    Expected Outcomes Short Term: Will quit all tobacco product use, adhering to prevention of relapse plan.;Long Term: Complete abstinence from all tobacco products for at least 12 months from quit date.    Improve shortness of breath with ADL's Yes    Intervention Provide education, individualized exercise plan and daily activity instruction to help decrease symptoms of SOB with activities of daily living.    Expected Outcomes Short Term: Improve cardiorespiratory fitness to achieve a reduction of symptoms when performing ADLs;Long Term: Be able to perform more ADLs without symptoms or delay the onset of symptoms    Diabetes Yes    Intervention Provide education about signs/symptoms and action to take for hypo/hyperglycemia.;Provide education about proper nutrition, including hydration, and aerobic/resistive exercise prescription along with prescribed medications to achieve blood glucose in normal ranges: Fasting glucose 65-99 mg/dL    Expected Outcomes Short Term: Participant verbalizes understanding of the signs/symptoms and immediate care of hyper/hypoglycemia, proper foot care and importance of medication, aerobic/resistive exercise and nutrition plan for blood glucose control.;Long Term: Attainment of HbA1C < 7%.    Hypertension Yes    Intervention Provide education on lifestyle modifcations including regular physical activity/exercise, weight management, moderate sodium restriction and increased consumption of  fresh fruit, vegetables, and low fat dairy, alcohol moderation, and smoking cessation.;Monitor prescription use compliance.    Expected Outcomes Short Term: Continued assessment and intervention until BP is < 140/62mm HG in hypertensive participants. < 130/11mm HG in hypertensive participants with diabetes, heart failure or chronic kidney disease.;Long Term:  Maintenance of blood pressure at goal levels.    Lipids Yes    Intervention Provide education and support for participant on nutrition & aerobic/resistive exercise along with prescribed medications to achieve LDL 70mg , HDL >40mg .    Expected Outcomes Short Term: Participant states understanding of desired cholesterol values and is compliant with medications prescribed. Participant is following exercise prescription and nutrition guidelines.;Long Term: Cholesterol controlled with medications as prescribed, with individualized exercise RX and with personalized nutrition plan. Value goals: LDL < 70mg , HDL > 40 mg.             Education:Diabetes - Individual verbal and written instruction to review signs/symptoms of diabetes, desired ranges of glucose level fasting, after meals and with exercise. Acknowledge that pre and post exercise glucose checks will be done for 3 sessions at entry of program. Flowsheet Row Pulmonary Rehab from 08/26/2023 in [facility] Cardiac and Pulmonary Rehab  Date 08/25/23  Educator [facility]  Instruction Review Code 1- Verbalizes Understanding       Know Your Numbers and Heart Failure: - Group verbal and visual instruction to discuss disease risk factors for cardiac and pulmonary disease and treatment options.  Reviews associated critical values for Overweight/Obesity, Hypertension, Cholesterol, and Diabetes.  Discusses basics of heart failure: signs/symptoms and treatments.  Introduces Heart Failure Zone chart for action plan for heart failure.  Written material given at graduation.   Core Components/Risk Factors/Patient Goals Review:    Core Components/Risk Factors/Patient Goals at Discharge (Final Review):    ITP Comments:  ITP Comments     Row Name 08/25/23 1144 08/26/23 0908 08/30/23 1728 09/01/23 1316     ITP Comments Initial phone call completed. Diagnosis can be found in [facility] 11[facility]. EP Orientation scheduled for Thursday 1/9 at 8am. Completed and gym  orientation. Initial ITP created and sent for review to Dr. Faud Aleskerov, Medical Director. First full day of exercise!  Patient was oriented to gym and equipment including functions, settings, policies, and procedures.  Patient's individual exercise prescription and treatment plan were reviewed.  All starting workloads were established based on the results of the 6 minute walk test done at initial orientation visit.  The plan for exercise progression was also introduced and progression will be customized based on patient's performance and goals. 30 Day review completed. Medical Director ITP review done, changes made as directed, and signed approval by Medical Director.             Comments: 30 day review

## 2023-09-01 NOTE — Progress Notes (Signed)
 Daily Session Note  Patient Details  Name: Robert Combs MRN: [id number] Date of Birth: 08/25/1945 Referring Provider:   Flowsheet Row Pulmonary Rehab from 08/26/2023 in [facility] Cardiac and Pulmonary Rehab  Referring Provider Dr. Artie Laster       Encounter Date: 09/01/2023  Check In:  Session Check In - 09/01/23 1700       Check-In   Supervising physician immediately available to respond to emergencies See telemetry face sheet for immediately available ER MD    Location [facility]-Cardiac & Pulmonary Rehab    Staff Present Sue Em RN,BSN;Joseph Neldon [place], [place], RRT, CPFT    Virtual Visit No    Medication changes reported     No    Fall or balance concerns reported    No    Warm-up and Cool-down Performed on first and last piece of equipment    Resistance Training Performed Yes    VAD Patient? No    PAD/SET Patient? No      Pain Assessment   Currently in Pain? No/denies                Social History   Tobacco Use  Smoking Status Former   Average packs/day: 1 pack/day for 25.0 years (25.0 ttl pk-yrs)   Types: Cigarettes   Start date: 07/12/1995   Quit date: 07/11/2020   Years since quitting: 3.1   Passive exposure: Current  Smokeless Tobacco Never  Tobacco Comments   02/03/21 currently at 1 ppd, [facility]    Goals Met:  Independence with exercise equipment Exercise tolerated well No report of concerns or symptoms today Strength training completed today  Goals Unmet:  Not Applicable  Comments: Pt able to follow exercise prescription today without complaint.  Will continue to monitor for progression.    Dr. Firman Hughes is Medical Director for [facility] Cardiac Rehabilitation.  Dr. Fuad Aleskerov is Medical Director for [facility] Pulmonary Rehabilitation.

## 2023-09-02 ENCOUNTER — Encounter: Payer: No Typology Code available for payment source | Admitting: *Deleted

## 2023-09-02 DIAGNOSIS — J449 Chronic obstructive pulmonary disease, unspecified: Secondary | ICD-10-CM | POA: Diagnosis not present

## 2023-09-02 LAB — GLUCOSE, CAPILLARY
Glucose-Capillary: 177 mg/dL — ABNORMAL HIGH (ref 70–99)
Glucose-Capillary: 105 mg/dL — ABNORMAL HIGH (ref 70–99)

## 2023-09-02 NOTE — Progress Notes (Signed)
Daily Session Note  Patient Details  Name: Robert Combs MRN: [id number] Date of Birth: 1946-03-01 Referring Provider:   Flowsheet Row Pulmonary Rehab from 08/26/2023 in [facility] Cardiac and Pulmonary Rehab  Referring Provider Dr. Carlisle Cater       Encounter Date: 09/02/2023  Check In:  Session Check In - 09/02/23 1701       Check-In   Supervising physician immediately available to respond to emergencies See telemetry face sheet for immediately available ER MD    Location [facility]-Cardiac & Pulmonary Rehab    Staff Present Maxon Conetta BS, Exercise Physiologist;Trinna Kunst Jewel Baize RN,BSN;Joseph Hood RCP,RRT,BSRT    Virtual Visit No    Medication changes reported     No    Fall or balance concerns reported    No    Warm-up and Cool-down Performed on first and last piece of equipment    Resistance Training Performed Yes    VAD Patient? No    PAD/SET Patient? No      Pain Assessment   Currently in Pain? No/denies                Social History   Tobacco Use  Smoking Status Former   Average packs/day: 1 pack/day for 25.0 years (25.0 ttl pk-yrs)   Types: Cigarettes   Start date: 07/12/1995   Quit date: 07/11/2020   Years since quitting: 3.1   Passive exposure: Current  Smokeless Tobacco Never  Tobacco Comments   02/03/21 currently at 1 ppd, [facility] and [facility] is [facility] it    Goals Met:  Independence with exercise equipment Exercise tolerated well No report of concerns or symptoms today Strength training completed today  Goals Unmet:  Not Applicable  Comments: Pt able to follow exercise prescription today without complaint.  Will continue to monitor for progression.    Dr. Bethann Punches is Medical Director for [facility] Cardiac Rehabilitation.  Dr. Vida Rigger is Medical Director for [facility] Pulmonary Rehabilitation.

## 2023-09-06 ENCOUNTER — Encounter: Payer: No Typology Code available for payment source | Admitting: *Deleted

## 2023-09-06 DIAGNOSIS — J449 Chronic obstructive pulmonary disease, unspecified: Secondary | ICD-10-CM | POA: Diagnosis not present

## 2023-09-06 NOTE — Progress Notes (Signed)
Daily Session Note  Patient Details  Name: Robert Combs MRN: [id number] Date of Birth: 1946/08/15 Referring Provider:   Flowsheet Row Pulmonary Rehab from 08/26/2023 in [facility] Cardiac and Pulmonary Rehab  Referring Provider Dr. Carlisle Cater       Encounter Date: 09/06/2023  Check In:  Session Check In - 09/06/23 1712       Check-In   Supervising physician immediately available to respond to emergencies See telemetry face sheet for immediately available ER MD    Location [facility]-Cardiac & Pulmonary Rehab    Staff Present Susann Givens RN,BSN;Joseph [facility] Madilyn Fireman BS, ACSM CEP, Exercise Physiologist    Virtual Visit No    Medication changes reported     No    Fall or balance concerns reported    No    Warm-up and Cool-down Performed on first and last piece of equipment    Resistance Training Performed Yes    VAD Patient? No    PAD/SET Patient? No      Pain Assessment   Currently in Pain? No/denies                Social History   Tobacco Use  Smoking Status Former   Average packs/day: 1 pack/day for 25.0 years (25.0 ttl pk-yrs)   Types: Cigarettes   Start date: 07/12/1995   Quit date: 07/11/2020   Years since quitting: 3.1   Passive exposure: Current  Smokeless Tobacco Never  Tobacco Comments   02/03/21 currently at 1 ppd, [facility] and [facility] is [facility] it    Goals Met:  Independence with exercise equipment Exercise tolerated well No report of concerns or symptoms today Strength training completed today  Goals Unmet:  Not Applicable  Comments: Pt able to follow exercise prescription today without complaint.  Will continue to monitor for progression.    Dr. Bethann Punches is Medical Director for [facility] Cardiac Rehabilitation.  Dr. Vida Rigger is Medical Director for [facility] Pulmonary Rehabilitation.

## 2023-09-08 ENCOUNTER — Encounter: Payer: No Typology Code available for payment source | Admitting: *Deleted

## 2023-09-08 DIAGNOSIS — J449 Chronic obstructive pulmonary disease, unspecified: Secondary | ICD-10-CM

## 2023-09-08 NOTE — Progress Notes (Signed)
Daily Session Note  Patient Details  Name: Robert Combs MRN: [id number] Date of Birth: 1946-01-16 Referring Provider:   Flowsheet Row Pulmonary Rehab from 08/26/2023 in [facility] Cardiac and Pulmonary Rehab  Referring Provider Dr. Carlisle Cater       Encounter Date: 09/08/2023  Check In:  Session Check In - 09/08/23 1724       Check-In   Supervising physician immediately available to respond to emergencies See telemetry face sheet for immediately available ER MD    Location [facility]-Cardiac & Pulmonary Rehab    Staff Present Susann Givens RN,BSN;Jason Wallace Cullens RDN,LDN;Kelly Madilyn Fireman BS, ACSM CEP, Exercise Physiologist    Virtual Visit No    Medication changes reported     No    Fall or balance concerns reported    No    Warm-up and Cool-down Performed on first and last piece of equipment    Resistance Training Performed Yes    VAD Patient? No    PAD/SET Patient? No      Pain Assessment   Currently in Pain? No/denies                Social History   Tobacco Use  Smoking Status Former   Average packs/day: 1 pack/day for 25.0 years (25.0 ttl pk-yrs)   Types: Cigarettes   Start date: 07/12/1995   Quit date: 07/11/2020   Years since quitting: 3.1   Passive exposure: Current  Smokeless Tobacco Never  Tobacco Comments   02/03/21 currently at 1 ppd, [facility] and [facility] is [facility] it    Goals Met:  Independence with exercise equipment Exercise tolerated well No report of concerns or symptoms today Strength training completed today  Goals Unmet:  Not Applicable  Comments: Pt able to follow exercise prescription today without complaint.  Will continue to monitor for progression.    Dr. Bethann Punches is Medical Director for [facility] Cardiac Rehabilitation.  Dr. Vida Rigger is Medical Director for [facility] Pulmonary Rehabilitation.

## 2023-09-09 ENCOUNTER — Encounter: Payer: No Typology Code available for payment source | Admitting: *Deleted

## 2023-09-09 DIAGNOSIS — J449 Chronic obstructive pulmonary disease, unspecified: Secondary | ICD-10-CM

## 2023-09-09 NOTE — Progress Notes (Signed)
Daily Session Note  Patient Details  Name: Robert Combs MRN: [id number] Date of Birth: [birth year] Referring Provider:   Flowsheet Row Pulmonary Rehab from 08/26/2023 in [facility] Cardiac and Pulmonary Rehab  Referring Provider Dr. Carlisle Cater       Encounter Date: 09/09/2023  Check In:  Session Check In - 09/09/23 1702       Check-In   Supervising physician immediately available to respond to emergencies See telemetry face sheet for immediately available ER MD    Location [facility]-Cardiac & Pulmonary Rehab    Staff Present Susann Givens RN,BSN;Maxon Manya Silvas BS, Exercise Physiologist;Margaret Best, MS, Exercise Physiologist    Virtual Visit No    Medication changes reported     No    Fall or balance concerns reported    No    Tobacco Cessation No Change    Warm-up and Cool-down Performed on first and last piece of equipment    Resistance Training Performed Yes    VAD Patient? No    PAD/SET Patient? No      Pain Assessment   Currently in Pain? No/denies                Social History   Tobacco Use  Smoking Status Former   Average packs/day: 1 pack/day for 25.0 years (25.0 ttl pk-yrs)   Types: Cigarettes   Start date: 07/12/1995   Quit date: 07/11/2020   Years since quitting: 3.1   Passive exposure: Current  Smokeless Tobacco Never  Tobacco Comments   02/03/21 currently at 1 ppd, [facility] and [facility] is [facility] it    Goals Met:  Independence with exercise equipment Exercise tolerated well No report of concerns or symptoms today Strength training completed today  Goals Unmet:  Not Applicable  Comments: Pt able to follow exercise prescription today without complaint.  Will continue to monitor for progression.    Dr. Bethann Punches is Medical Director for [facility] Cardiac Rehabilitation.  Dr. Vida Rigger is Medical Director for [facility] Pulmonary Rehabilitation.

## 2023-09-14 NOTE — Progress Notes (Deleted)
  Cardiology Office Note:  .   Date:  09/14/2023  ID:  Midge Aver, DOB [birth year], MRN [id number] PCP: Anson Fret, MD  [facility] Providers Cardiologist:  None { Click to update primary MD,subspecialty MD or APP then REFRESH:1}   History of Present Illness: Marland Kitchen   CEASER EBELING is a 78 y.o. male with below history who presents for follow-up. Seen for CP and SOB. Stress PET normal. Echo normal through [place].         Problem List DM -A1c 10.8 HTN COPD -moderate Tobacco abuse  HLD -T chol 155, HDL 34, LDL 49, TG 360 OSA CKD IIIa -eGFR 55 8. PACs  -30% burden  9. RBBB 10. Coronary calcifications  -negative PET MPI 04/29/2023    ROS: All other ROS reviewed and negative. Pertinent positives noted in the HPI.     Studies Reviewed: Marland Kitchen        CXR normal 12/14/2022 Monitor 14 days 01/04/2023: Minimum HR 45 bpm, Max 160 bpm, average HR 62 bpm, SVT noted (23 episodes in 14 days; longest duration 8.8 second duration, EAT), PACs 30.5%   TTE 55%, normal RV   PET MPI 04/29/2023   The study is normal. The study is low risk.   LV perfusion is normal. There is no evidence of ischemia. There is no evidence of infarction.   Rest left ventricular function is normal. Rest EF: 50%. Stress left ventricular function is normal. Stress EF: 57%. End diastolic cavity size is normal. End systolic cavity size is normal. No evidence of transient ischemic dilation (TID) noted.   Myocardial blood flow was computed to be 0.12ml/g/min at rest and 1.33ml/g/min at stress. Global myocardial blood flow reserve was 2.84 and was normal.   Coronary calcium was present on the attenuation correction CT images. Severe coronary calcifications were present. Coronary calcifications were present in the left anterior descending artery, left circumflex artery and right coronary artery distribution(s). At least mild aortic valve calcifications.   Electronically signed by: Parke Poisson, MD Physical  Exam:   VS:  There were no vitals taken for this visit.   Wt Readings from Last 3 Encounters:  08/26/23 213 lb 11.2 oz (96.9 kg)  05/07/23 208 lb 6.4 oz (94.5 kg)  03/19/23 217 lb 3.2 oz (98.5 kg)    GEN: Well nourished, well developed in no acute distress NECK: No JVD; No carotid bruits CARDIAC: ***RRR, no murmurs, rubs, gallops RESPIRATORY:  Clear to auscultation without rales, wheezing or rhonchi  ABDOMEN: Soft, non-tender, non-distended EXTREMITIES:  No edema; No deformity  ASSESSMENT AND PLAN: .   ***    {Are you ordering a CV Procedure (e.g. stress test, cath, DCCV, TEE, etc)?   Press F2        :[id number]}   Follow-up: No follow-ups on file.  Time Spent with Patient: I have spent a total of *** minutes caring for this patient today face to face, ordering and reviewing labs/tests, reviewing prior records/medical history, examining the patient, establishing an assessment and plan, communicating results/findings to the patient/family, and documenting in the medical record.   Signed, Lenna Gilford. Flora Lipps, MD, [facility]  [facility]  [address], [address] [place], [place] [address] [phone]  11:51 AM

## 2023-09-17 ENCOUNTER — Ambulatory Visit
Payer: No Typology Code available for payment source | Attending: Cardiovascular Disease | Admitting: Cardiovascular Disease

## 2023-09-17 DIAGNOSIS — E782 Mixed hyperlipidemia: Secondary | ICD-10-CM

## 2023-09-17 DIAGNOSIS — I491 Atrial premature depolarization: Secondary | ICD-10-CM

## 2023-09-17 DIAGNOSIS — I471 Supraventricular tachycardia, unspecified: Secondary | ICD-10-CM

## 2023-09-17 DIAGNOSIS — I2510 Atherosclerotic heart disease of native coronary artery without angina pectoris: Secondary | ICD-10-CM

## 2023-09-17 DIAGNOSIS — R001 Bradycardia, unspecified: Secondary | ICD-10-CM

## 2023-09-27 ENCOUNTER — Encounter: Payer: No Typology Code available for payment source | Admitting: *Deleted

## 2023-09-27 DIAGNOSIS — J449 Chronic obstructive pulmonary disease, unspecified: Secondary | ICD-10-CM | POA: Diagnosis present

## 2023-09-27 NOTE — Progress Notes (Signed)
 Daily Session Note  Patient Details  Name: Robert Combs MRN: [id number] Date of Birth: December 09, 1945 Referring Provider:   Flowsheet Row Pulmonary Rehab from 08/26/2023 in [facility]  Referring Provider Dr. Artie Laster       Encounter Date: 09/27/2023  Check In:  Session Check In - 09/27/23 1709       Check-In   Supervising physician immediately available to respond to emergencies See telemetry face sheet for immediately available ER MD    Location [facility]-Cardiac & Pulmonary Rehab    Staff Present Sue Em RN,BSN;[facility] [place] BS, ACSM CEP, Exercise Physiologist    Virtual Visit No    Medication changes reported     No    Fall or balance concerns reported    No    Tobacco Cessation Use Increase    Current number of cigarettes/nicotine per day     1    Warm-up and Cool-down Performed on first and last piece of equipment    Resistance Training Performed Yes    VAD Patient? No    PAD/SET Patient? No      Pain Assessment   Currently in Pain? No/denies                Social History   Tobacco Use  Smoking Status Former   Average packs/day: 1 pack/day for 25.0 years (25.0 ttl pk-yrs)   Types: Cigarettes   Start date: 07/12/1995   Quit date: 07/11/2020   Years since quitting: 3.2   Passive exposure: Current  Smokeless Tobacco Never  Tobacco Comments   02/03/21 currently at 1 ppd, [facility] and [facility] is [facility] it    Goals Met:  Independence with exercise equipment Exercise tolerated well No report of concerns or symptoms today Strength training completed today  Goals Unmet:  Not Applicable  Comments: Pt able to follow exercise prescription today without complaint.  Will continue to monitor for progression.    Dr. Firman Hughes is Medical Director for [facility] Card[facility] Rehabilitation.  Dr. Fuad Aleskerov is Medical Director for [facility] Pulmonary Rehabilitation.

## 2023-09-29 ENCOUNTER — Encounter: Payer: No Typology Code available for payment source | Admitting: *Deleted

## 2023-09-29 DIAGNOSIS — J449 Chronic obstructive pulmonary disease, unspecified: Secondary | ICD-10-CM

## 2023-09-29 NOTE — Progress Notes (Signed)
Pulmonary Individual Treatment Plan  Patient Details  Name: Robert Combs MRN: [id number] Date of Birth: 1945-11-28 Referring Provider:   Flowsheet Row Pulmonary Rehab from 08/26/2023 in [facility] Cardiac and Pulmonary Rehab  Referring Provider Dr. Carlisle Cater       Initial Encounter Date:  Flowsheet Row Pulmonary Rehab from 08/26/2023 in [facility] Cardiac and Pulmonary Rehab  Date 08/26/23       Visit Diagnosis: Chronic obstructive pulmonary disease, unspecified COPD type (HCC)  Patient's Home Medications on Admission:  Current Outpatient Medications:    albuterol (PROVENTIL HFA;VENTOLIN HFA) 108 (90 Base) MCG/ACT inhaler, Inhale 1 puff into the lungs every 6 (six) hours as needed for wheezing or shortness of breath., Disp: , Rfl:    albuterol (VENTOLIN HFA) 108 (90 Base) MCG/ACT inhaler, Inhale into the lungs., Disp: , Rfl:    Alogliptin Benzoate 25 MG TABS, Take by mouth., Disp: , Rfl:    Ascorbic Acid (VITAMIN C) 100 MG tablet, Take 100 mg by mouth daily., Disp: , Rfl:    aspirin EC 81 MG tablet, Take 81 mg by mouth daily.  , Disp: , Rfl:    atorvastatin (LIPITOR) 40 MG tablet, Take 40 mg by mouth daily., Disp: , Rfl:    azelastine (ASTELIN) 0.1 % nasal spray, Place 2 sprays into both nostrils 2 (two) times daily as needed for rhinitis. Use in each nostril as directed, Disp: 30 mL, Rfl: 5   budesonide-formoterol (SYMBICORT) 160-4.5 MCG/ACT inhaler, Inhale into the lungs. (Patient not taking: Reported on 08/25/2023), Disp: , Rfl:    cholecalciferol (VITAMIN D) 1000 UNITS tablet, Take 2,000 Units by mouth daily., Disp: , Rfl:    fluticasone (FLONASE) 50 MCG/ACT nasal spray, Place 2 sprays into both nostrils daily. (Patient not taking: Reported on 08/25/2023), Disp: 16 g, Rfl: 5   folic acid (FOLVITE) 1 MG tablet, Take 1 mg by mouth daily., Disp: , Rfl:    gabapentin (NEURONTIN) 300 MG capsule, Take 300 mg by mouth.  TAKE ONE CAPSULE BY MOUTH EVERY MORNING AND TAKE ONE CAPSULE AT NOON AND TAKE  TWO CAPSULES EVERY EVENING FOR NERVE PAIN/BURNING, Disp: , Rfl:    hydrochlorothiazide (HYDRODIURIL) 25 MG tablet, Take 1 tablet by mouth every morning., Disp: , Rfl:    losartan (COZAAR) 100 MG tablet, Take 100 mg by mouth daily., Disp: , Rfl:    Magnesium Oxide 420 MG TABS, Take 420 mg by mouth daily., Disp: , Rfl:    Meclizine HCl 25 MG CHEW, Chew by mouth.  CHEW 1-2 TABLETS BY MOUTH 1 HOUR BEFORE BEDTIME FOR DIZZINESS AND SLEEP; NO DRIVING FOR 6 HOURS AFTER TAKING, Disp: , Rfl:    metFORMIN (GLUCOPHAGE-XR) 750 MG 24 hr tablet, Take 750 mg by mouth daily with breakfast., Disp: , Rfl:    mometasone (ASMANEX) 220 MCG/INH inhaler, Take by mouth., Disp: , Rfl:    Olodaterol HCl 2.5 MCG/ACT AERS, Take by mouth., Disp: , Rfl:    omeprazole (PRILOSEC OTC) 20 MG tablet, Take 20 mg by mouth daily. (Patient not taking: Reported on 08/25/2023), Disp: , Rfl:    predniSONE (DELTASONE) 10 MG tablet, 6-day taper; 6 pills day one, 5 pills day 2, 4 pills day 3, 3 pills on day 4, 2 pills on day 5, 1 pill on day 6. Follow package instructions., Disp: , Rfl:    rOPINIRole (REQUIP) 0.5 MG tablet, Take by mouth., Disp: , Rfl:    rosuvastatin (CRESTOR) 20 MG tablet, Take 1 tablet (20 mg total) by mouth  daily., Disp: 90 tablet, Rfl: 3   tamsulosin (FLOMAX) 0.4 MG CAPS capsule, Take 1 capsule by mouth daily., Disp: , Rfl:    traMADol (ULTRAM) 50 MG tablet, Take 1 tablet (50 mg total) by mouth every 6 (six) hours as needed., Disp: 10 tablet, Rfl: 0   vardenafil (LEVITRA) 20 MG tablet, Take 10 mg by mouth., Disp: , Rfl:   Past Medical History: Past Medical History:  Diagnosis Date   Chronic kidney disease    COPD (chronic obstructive pulmonary disease) (HCC)    Coronary artery disease    DDD (degenerative disc disease)    Diabetes mellitus without complication (HCC)    Hyperlipidemia    Hypertension     Tobacco Use: Social History   Tobacco Use  Smoking Status Former   Average packs/day: 1 pack/day for 25.0  years (25.0 ttl pk-yrs)   Types: Cigarettes   Start date: 07/12/1995   Quit date: 07/11/2020   Years since quitting: 3.2   Passive exposure: Current  Smokeless Tobacco Never  Tobacco Comments   02/03/21 currently at 1 ppd, [facility] and [facility] is [facility] it    Labs: Review Flowsheet       Latest Ref Rng & Units 02/03/2010 11/04/2015  Labs for ITP Cardiac and Pulmonary Rehab  Bicarbonate 20.0 - 24.0 mEq/L - 26.3   TCO2 0 - 100 mmol/L 31  28   O2 Saturation % - 75.0      Pulmonary Assessment Scores:  Pulmonary Assessment Scores     Row Name 08/26/23 1105         ADL UCSD   ADL Phase Entry     SOB Score total 55     Rest 0     Walk 0     Stairs 5     Bath 0     Dress 0     Shop 0       CAT Score   CAT Score 26       mMRC Score   mMRC Score 1              UCSD: Self-administered rating of dyspnea associated with activities of daily living (ADLs) 6-point scale (0 = "not at all" to 5 = "maximal or unable to do because of breathlessness")  Scoring Scores range from 0 to 120.  Minimally important difference is 5 units  CAT: CAT can identify the health impairment of COPD patients and is better correlated with disease progression.  CAT has a scoring range of zero to 40. The CAT score is classified into four groups of low (less than 10), medium (10 - 20), high (21-30) and very high (31-40) based on the impact level of disease on health status. A CAT score over 10 suggests significant symptoms.  A worsening CAT score could be explained by an exacerbation, poor medication adherence, poor inhaler technique, or progression of COPD or comorbid conditions.  CAT MCID is 2 points  mMRC: mMRC (Modified Medical Research Council) Dyspnea Scale is used to assess the degree of baseline functional disability in patients of respiratory disease due to dyspnea. No minimal important difference is established. A decrease in score of 1 point or greater is considered a positive  change.   Pulmonary Function Assessment:   Exercise Target Goals: Exercise Program Goal: Individual exercise prescription set using results from initial 6 min walk test and THRR while considering  patient's activity barriers and safety.   Exercise Prescription Goal: Initial  exercise prescription builds to 30-45 minutes a day of aerobic activity, 2-3 days per week.  Home exercise guidelines will be given to patient during program as part of exercise prescription that the participant will acknowledge.  Education: Aerobic Exercise: - Group verbal and visual presentation on the components of exercise prescription. Introduces F.I.T.T principle from ACSM for exercise prescriptions.  Reviews F.I.T.T. principles of aerobic exercise including progression. Written material given at graduation. Flowsheet Row Pulmonary Rehab from 04/17/2021 in [facility] Cardiac and Pulmonary Rehab  Date 02/06/21  Educator [employer] [facility]  Instruction Review Code 1- Verbalizes Understanding       Education: Resistance Exercise: - Group verbal and visual presentation on the components of exercise prescription. Introduces F.I.T.T principle from ACSM for exercise prescriptions  Reviews F.I.T.T. principles of resistance exercise including progression. Written material given at graduation. Flowsheet Row Pulmonary Rehab from 04/17/2021 in [facility] Cardiac and Pulmonary Rehab  Date 04/17/21  Educator [facility]  Instruction Review Code 1- Verbalizes Understanding        Education: Exercise & Equipment Safety: - Individual verbal instruction and demonstration of equipment use and safety with use of the equipment. Flowsheet Row Pulmonary Rehab from 08/26/2023 in [facility] Cardiac and Pulmonary Rehab  Date 08/26/23  Educator NT  Instruction Review Code 1- Verbalizes Understanding       Education: Exercise Physiology & General Exercise Guidelines: - Group verbal and written instruction with models to review the exercise physiology of the cardiovascular  system and associated critical values. Provides general exercise guidelines with specific guidelines to those with heart or lung disease.    Education: Flexibility, Balance, Mind/Body Relaxation: - Group verbal and visual presentation with interactive activity on the components of exercise prescription. Introduces F.I.T.T principle from ACSM for exercise prescriptions. Reviews F.I.T.T. principles of flexibility and balance exercise training including progression. Also discusses the mind body connection.  Reviews various relaxation techniques to help reduce and manage stress (i.e. Deep breathing, progressive muscle relaxation, and visualization). Balance handout provided to take home. Written material given at graduation. Flowsheet Row Pulmonary Rehab from 04/17/2021 in [facility] Cardiac and Pulmonary Rehab  Date 02/20/21  Educator [facility]  Instruction Review Code 1- Verbalizes Understanding       Activity Barriers & Risk Stratification:  Activity Barriers & Cardiac Risk Stratification - 08/25/23 1135       Activity Barriers & Cardiac Risk Stratification   Activity Barriers Shortness of Breath;Deconditioning;Muscular Weakness;Balance Concerns   neuropathy (right leg worse)            6 Minute Walk:  6 Minute Walk     Row Name 08/26/23 0908         6 Minute Walk   Phase Initial     Distance 1340 feet     Walk Time 6 minutes     # of Rest Breaks 0     MPH 2.54     METS 2.49     RPE 13     Perceived Dyspnea  3     VO2 Peak 8.71     Symptoms Yes (comment)     Comments leg weakness     Resting HR 58 bpm     Resting BP 124/62     Resting Oxygen Saturation  94 %     Exercise Oxygen Saturation  during 6 min walk 88 %     Max Ex. HR 91 bpm     Max Ex. BP 136/64     2 Minute Post BP 126/58  Interval HR   1 Minute HR 63     2 Minute HR 73     3 Minute HR 70     4 Minute HR 78     5 Minute HR 81     6 Minute HR 91     2 Minute Post HR 63     Interval Heart Rate? Yes        Interval Oxygen   Interval Oxygen? Yes     Baseline Oxygen Saturation % 94 %     1 Minute Oxygen Saturation % 92 %     1 Minute Liters of Oxygen 0 L  RA     2 Minute Oxygen Saturation % 90 %     2 Minute Liters of Oxygen 0 L     3 Minute Oxygen Saturation % 88 %     3 Minute Liters of Oxygen 0 L     4 Minute Oxygen Saturation % 90 %     4 Minute Liters of Oxygen 0 L     5 Minute Oxygen Saturation % 89 %     5 Minute Liters of Oxygen 0 L     6 Minute Oxygen Saturation % 88 %     6 Minute Liters of Oxygen 0 L     2 Minute Post Oxygen Saturation % 96 %     2 Minute Post Liters of Oxygen 0 L             Oxygen Initial Assessment:  Oxygen Initial Assessment - 08/25/23 1148       Home Oxygen   Home Oxygen Device None    Sleep Oxygen Prescription None    Home Exercise Oxygen Prescription None    Home Resting Oxygen Prescription None    Compliance with Home Oxygen Use Yes      Intervention   Short Term Goals To learn and understand importance of monitoring SPO2 with pulse oximeter and demonstrate accurate use of the pulse oximeter.;To learn and understand importance of maintaining oxygen saturations>88%;To learn and demonstrate proper pursed lip breathing techniques or other breathing techniques. ;To learn and demonstrate proper use of respiratory medications    Long  Term Goals Verbalizes importance of monitoring SPO2 with pulse oximeter and return demonstration;Maintenance of O2 saturations>88%;Exhibits proper breathing techniques, such as pursed lip breathing or other method taught during program session;Compliance with respiratory medication             Oxygen Re-Evaluation:  Oxygen Re-Evaluation     Row Name 08/30/23 1729 09/06/23 1712           Program Oxygen Prescription   Program Oxygen Prescription -- None        Home Oxygen   Home Oxygen Device -- None      Sleep Oxygen Prescription -- None      Home Exercise Oxygen Prescription -- None      Home Resting  Oxygen Prescription -- None        Goals/Expected Outcomes   Short Term Goals -- To learn and demonstrate proper pursed lip breathing techniques or other breathing techniques.       Long  Term Goals -- Exhibits proper breathing techniques, such as pursed lip breathing or other method taught during program session      Comments Reviewed PLB technique with pt.  Talked about how it works and it's importance in maintaining their exercise saturations. Informed patient how to perform the Pursed Lipped breathing technique. Told  patient to Inhale through the nose and out the mouth with pursed lips to keep their airways open, help oxygenate them better, practice when at rest or doing strenuous activity. Patient Verbalizes understanding of technique and will work on and be reiterated during [facility].      Goals/Expected Outcomes Short: Become more profiecient at using PLB.   Long: Become independent at using PLB. Short: use PLB with exertion. Long: use PLB on exertion proficiently and independently.               Oxygen Discharge (Final Oxygen Re-Evaluation):  Oxygen Re-Evaluation - 09/06/23 1712       Program Oxygen Prescription   Program Oxygen Prescription None      Home Oxygen   Home Oxygen Device None    Sleep Oxygen Prescription None    Home Exercise Oxygen Prescription None    Home Resting Oxygen Prescription None      Goals/Expected Outcomes   Short Term Goals To learn and demonstrate proper pursed lip breathing techniques or other breathing techniques.     Long  Term Goals Exhibits proper breathing techniques, such as pursed lip breathing or other method taught during program session    Comments Informed patient how to perform the Pursed Lipped breathing technique. Told patient to Inhale through the nose and out the mouth with pursed lips to keep their airways open, help oxygenate them better, practice when at rest or doing strenuous activity. Patient Verbalizes understanding of  technique and will work on and be reiterated during [facility].    Goals/Expected Outcomes Short: use PLB with exertion. Long: use PLB on exertion proficiently and independently.             Initial Exercise Prescription:  Initial Exercise Prescription - 08/26/23 1100       Date of Initial Exercise RX and Referring Provider   Date 08/26/23    Referring Provider Dr. Carlisle Cater      Oxygen   Maintain Oxygen Saturation 88% or higher      Treadmill   MPH 2    Grade 0    Minutes 15    METs 2.53      NuStep   Level 2    SPM 80    Minutes 15    METs 2.49      REL-XR   Level 2    Speed 50    Minutes 15    METs 2.49      Prescription Details   Frequency (times per week) 3    Duration Progress to 30 minutes of continuous aerobic without signs/symptoms of physical distress      Intensity   THRR 40-80% of Max Heartrate 92-127    Ratings of Perceived Exertion 11-13    Perceived Dyspnea 0-4      Progression   Progression Continue to progress workloads to maintain intensity without signs/symptoms of physical distress.      Resistance Training   Training Prescription Yes    Weight 7 lb    Reps 10-15             Perform Capillary Blood Glucose checks as needed.  Exercise Prescription Changes:   Exercise Prescription Changes     Row Name 08/26/23 1100 09/16/23 1200           Response to Exercise   Blood Pressure (Admit) 124/62 142/70      Blood Pressure (Exercise) 136/64 156/82      Blood Pressure (Exit) 126/58 138/70  Heart Rate (Admit) 58 bpm 63 bpm      Heart Rate (Exercise) 91 bpm 103 bpm      Heart Rate (Exit) 63 bpm 70 bpm      Oxygen Saturation (Admit) 94 % 93 %      Oxygen Saturation (Exercise) 88 % 90 %      Oxygen Saturation (Exit) 96 % 94 %      Rating of Perceived Exertion (Exercise) 13 13      Perceived Dyspnea (Exercise) 3 2      Symptoms leg weakness none      Comments Results First 2 weeks of exercise      Duration --  Progress to 30 minutes of  aerobic without signs/symptoms of physical distress      Intensity -- THRR unchanged        Progression   Progression -- Continue to progress workloads to maintain intensity without signs/symptoms of physical distress.      Average METs -- 2.76        Resistance Training   Training Prescription -- Yes      Weight -- 7lb      Reps -- 10-15        Interval Training   Interval Training -- No        Treadmill   MPH -- 3.5      Grade -- 0      Minutes -- 15      METs -- 3.68        NuStep   Level -- 5      Minutes -- 15      METs -- 2.5        REL-XR   Level -- 2      Minutes -- 15      METs -- 3        Oxygen   Maintain Oxygen Saturation -- 88% or higher               Exercise Comments:   Exercise Comments     Row Name 08/30/23 1728           Exercise Comments First full day of exercise!  Patient was oriented to gym and equipment including functions, settings, policies, and procedures.  Patient's individual exercise prescription and treatment plan were reviewed.  All starting workloads were established based on the results of the 6 minute walk test done at initial orientation visit.  The plan for exercise progression was also introduced and progression will be customized based on patient's performance and goals.                Exercise Goals and Review:   Exercise Goals     Row Name 08/26/23 0910             Exercise Goals   Increase Physical Activity Yes       Intervention Provide advice, education, support and counseling about physical activity/exercise needs.;Develop an individualized exercise prescription for aerobic and resistive training based on initial evaluation findings, risk stratification, comorbidities and participant's personal goals.       Expected Outcomes Short Term: Attend rehab on a regular basis to increase amount of physical activity.;Long Term: Add in home exercise to make exercise part of routine and  to increase amount of physical activity.;Long Term: Exercising regularly at least 3-5 days a week.       Increase Strength and Stamina Yes       Intervention Provide advice,  education, support and counseling about physical activity/exercise needs.;Develop an individualized exercise prescription for aerobic and resistive training based on initial evaluation findings, risk stratification, comorbidities and participant's personal goals.       Expected Outcomes Short Term: Increase workloads from initial exercise prescription for resistance, speed, and METs.;Short Term: Perform resistance training exercises routinely during rehab and add in resistance training at home;Long Term: Improve cardiorespiratory fitness, muscular endurance and strength as measured by increased METs and functional capacity ( )       Able to understand and use rate of perceived exertion (RPE) scale Yes       Intervention Provide education and explanation on how to use RPE scale       Expected Outcomes Short Term: Able to use RPE daily in rehab to express subjective intensity level;Long Term:  Able to use RPE to guide intensity level when exercising independently       Able to understand and use Dyspnea scale Yes       Intervention Provide education and explanation on how to use Dyspnea scale       Expected Outcomes Short Term: Able to use Dyspnea scale daily in rehab to express subjective sense of shortness of breath during exertion;Long Term: Able to use Dyspnea scale to guide intensity level when exercising independently       Knowledge and understanding of Target Heart Rate Range (THRR) Yes       Intervention Provide education and explanation of THRR including how the numbers were predicted and where they are located for reference       Expected Outcomes Short Term: Able to use daily as guideline for intensity in rehab;Short Term: Able to state/look up THRR;Long Term: Able to use THRR to govern intensity when exercising  independently       Able to check pulse independently Yes       Intervention Provide education and demonstration on how to check pulse in carotid and radial arteries.;Review the importance of being able to check your own pulse for safety during independent exercise       Expected Outcomes Short Term: Able to explain why pulse checking is important during independent exercise;Long Term: Able to check pulse independently and accurately       Understanding of Exercise Prescription Yes       Intervention Provide education, explanation, and written materials on patient's individual exercise prescription       Expected Outcomes Short Term: Able to explain program exercise prescription;Long Term: Able to explain home exercise prescription to exercise independently                Exercise Goals Re-Evaluation :  Exercise Goals Re-Evaluation     Row Name 08/30/23 1728 09/16/23 1246           Exercise Goal Re-Evaluation   Exercise Goals Review Able to understand and use rate of perceived exertion (RPE) scale;Increase Physical Activity;Knowledge and understanding of Target Heart Rate Range (THRR);Understanding of Exercise Prescription;Increase Strength and Stamina;Able to check pulse independently Increase Physical Activity;Increase Strength and Stamina;Understanding of Exercise Prescription      Comments Reviewed RPE and dyspnea scale, THR and program prescription with pt today.  Pt voiced understanding and was given a copy of goals to take home. Griffin is off to a good start in the program. He was able to attend his first few sessions of rehab during this review. During his sessions he was able to increase his treadmill workload from his prescribed workload of  to 3.36mph. He was also able to increase his level on the T4 nustep from his prescribed level 2 to a level 5. We will continue to monitor his improvements and progress in the program.      Expected Outcomes Short: Use RPE daily to regulate  intensity.  Long: Follow program prescription in THR. Short: COntinue to increase treadmill workload. Long: Continue exercise to increase strength and stamina.               Discharge Exercise Prescription (Final Exercise Prescription Changes):  Exercise Prescription Changes - 09/16/23 1200       Response to Exercise   Blood Pressure (Admit) 142/70    Blood Pressure (Exercise) 156/82    Blood Pressure (Exit) 138/70    Heart Rate (Admit) 63 bpm    Heart Rate (Exercise) 103 bpm    Heart Rate (Exit) 70 bpm    Oxygen Saturation (Admit) 93 %    Oxygen Saturation (Exercise) 90 %    Oxygen Saturation (Exit) 94 %    Rating of Perceived Exertion (Exercise) 13    Perceived Dyspnea (Exercise) 2    Symptoms none    Comments First 2 weeks of exercise    Duration Progress to 30 minutes of  aerobic without signs/symptoms of physical distress    Intensity THRR unchanged      Progression   Progression Continue to progress workloads to maintain intensity without signs/symptoms of physical distress.    Average METs 2.76      Resistance Training   Training Prescription Yes    Weight 7lb    Reps 10-15      Interval Training   Interval Training No      Treadmill   MPH 3.5    Grade 0    Minutes 15    METs 3.68      NuStep   Level 5    Minutes 15    METs 2.5      REL-XR   Level 2    Minutes 15    METs 3      Oxygen   Maintain Oxygen Saturation 88% or higher             Nutrition:  Target Goals: Understanding of nutrition guidelines, daily intake of sodium 1500mg , cholesterol 200mg , calories 30% from fat and 7% or less from saturated fats, daily to have 5 or more servings of fruits and vegetables.  Education: All About Nutrition: -Group instruction provided by verbal, written material, interactive activities, discussions, models, and posters to present general guidelines for heart healthy nutrition including fat, fiber, MyPlate, the role of sodium in heart healthy  nutrition, utilization of the nutrition label, and utilization of this knowledge for meal planning. Follow up email sent as well. Written material given at graduation.   Biometrics:  Pre Biometrics - 08/26/23 0911       Pre Biometrics   Height 5\' 10"  (1.778 m)    Weight 213 lb 11.2 oz (96.9 kg)    Waist Circumference 44 inches    Hip Circumference 42 inches    Waist to Hip Ratio 1.05 %    BMI (Calculated) 30.66    Single Leg Stand 8.9 seconds              Nutrition Therapy Plan and Nutrition Goals:  Nutrition Therapy & Goals - 08/26/23 1106       Nutrition Therapy   RD appointment deferred Yes      Intervention Plan  Intervention Prescribe, educate and counsel regarding individualized specific dietary modifications aiming towards targeted core components such as weight, hypertension, lipid management, diabetes, heart failure and other comorbidities.    Expected Outcomes Short Term Goal: Understand basic principles of dietary content, such as calories, fat, sodium, cholesterol and nutrients.;Short Term Goal: A plan has been developed with personal nutrition goals set during dietitian appointment.;Long Term Goal: Adherence to prescribed nutrition plan.             Nutrition Assessments:  MEDIFICTS Score Key: >=70 Need to make dietary changes  40-70 Heart Healthy Diet <= 40 Therapeutic Level Cholesterol Diet  Flowsheet Row Pulmonary Rehab from 08/26/2023 in [facility] Cardiac and Pulmonary Rehab  Picture Your Plate Total Score on Admission 52      Picture Your Plate Scores: <60 Unhealthy dietary pattern with much room for improvement. 41-50 Dietary pattern unlikely to meet recommendations for good health and room for improvement. 51-60 More healthful dietary pattern, with some room for improvement.  >60 Healthy dietary pattern, although there may be some specific behaviors that could be improved.   Nutrition Goals Re-Evaluation:  Nutrition Goals Re-Evaluation      Row Name 09/06/23 1720             Goals   Comment Patient was informed on why it is important to maintain a balanced diet when dealing with Respiratory issues. Explained that it takes a lot of energy to breath and when they are short of breath often they will need to have a good diet to help keep up with the calories they are expending for breathing.       Expected Outcome Short: Choose and plan snacks accordingly to patients caloric intake to improve breathing. Long: Maintain a diet independently that meets their caloric intake to aid in daily shortness of breath.                Nutrition Goals Discharge (Final Nutrition Goals Re-Evaluation):  Nutrition Goals Re-Evaluation - 09/06/23 1720       Goals   Comment Patient was informed on why it is important to maintain a balanced diet when dealing with Respiratory issues. Explained that it takes a lot of energy to breath and when they are short of breath often they will need to have a good diet to help keep up with the calories they are expending for breathing.    Expected Outcome Short: Choose and plan snacks accordingly to patients caloric intake to improve breathing. Long: Maintain a diet independently that meets their caloric intake to aid in daily shortness of breath.             Psychosocial: Target Goals: Acknowledge presence or absence of significant depression and/or stress, maximize coping skills, provide positive support system. Participant is able to verbalize types and ability to use techniques and skills needed for reducing stress and depression.   Education: Stress, Anxiety, and Depression - Group verbal and visual presentation to define topics covered.  Reviews how body is impacted by stress, anxiety, and depression.  Also discusses healthy ways to reduce stress and to treat/manage anxiety and depression.  Written material given at graduation.   Education: Sleep Hygiene -Provides group verbal and written  instruction about how sleep can affect your health.  Define sleep hygiene, discuss sleep cycles and impact of sleep habits. Review good sleep hygiene tips.    Initial Review & Psychosocial Screening:  Initial Psych Review & Screening - 08/25/23 1148  Initial Review   Current issues with Current Stress Concerns;Current Depression;Current Sleep Concerns    Source of Stress Concerns Chronic Illness      Family Dynamics   Good Support System? Yes   local family and friends     Barriers   Psychosocial barriers to participate in program There are no identifiable barriers or psychosocial needs.;The patient should benefit from training in stress management and relaxation.      Screening Interventions   Interventions Provide feedback about the scores to participant;To provide support and resources with identified psychosocial needs;Encouraged to exercise    Expected Outcomes Short Term goal: Utilizing psychosocial counselor, staff and physician to assist with identification of specific Stressors or current issues interfering with healing process. Setting desired goal for each stressor or current issue identified.;Long Term Goal: Stressors or current issues are controlled or eliminated.;Short Term goal: Identification and review with participant of any Quality of Life or Depression concerns found by scoring the questionnaire.;Long Term goal: The participant improves quality of Life and PHQ9 Scores as seen by post scores and/or verbalization of changes             Quality of Life Scores:  Scores of 19 and below usually indicate a poorer quality of life in these areas.  A difference of  2-3 points is a clinically meaningful difference.  A difference of 2-3 points in the total score of the Quality of Life Index has been associated with significant improvement in overall quality of life, self-image, physical symptoms, and general health in studies assessing change in quality of  life.  PHQ-9: Review Flowsheet  More data exists      08/26/2023 07/01/2021 06/03/2021 04/29/2021 04/01/2021  Depression screen PHQ 2/9  Decreased Interest 2 1 2 2 1   Down, Depressed, Hopeless 2 0 1 2 2   PHQ - 2 Score 4 1 3 4 3   Altered sleeping 3 3 3 3 3   Tired, decreased energy 3 3 1 2 2   Change in appetite 2 1 2 2 2   Feeling bad or failure about yourself  1 0 0 0 0  Trouble concentrating 2 1 2 2  0  Moving slowly or fidgety/restless 0 0 0 0 0  Suicidal thoughts 2 0 0 0 0  PHQ-9 Score 17 9 11 13 10   Difficult doing work/chores Somewhat difficult Somewhat difficult Somewhat difficult Not difficult at all Not difficult at all   Interpretation of Total Score  Total Score Depression Severity:  1-4 = Minimal depression, 5-9 = Mild depression, 10-14 = Moderate depression, 15-19 = Moderately severe depression, 20-27 = Severe depression   Psychosocial Evaluation and Intervention:  Psychosocial Evaluation - 08/25/23 1150       Psychosocial Evaluation & Interventions   Interventions Encouraged to exercise with the program and follow exercise prescription;Stress management education;Relaxation education    Comments Micael is returning to pulmonary rehab. He did the program a couple years ago and really enjoyed it, so he is looking forward to attending again. His breathing has gotten worse recently, which is altering his difficult sleep patterns even more. He has had sleep concerns for years and notes he is waking more frequently. He states he recently had a sleep study and was told he needs a cpap, but nothing has been given to him. He sees the [place] next week and plans to ask about a cpap. He has a history of PTSD which he states he feels is being managed throught [place], support from his family and  friends, and setting personal goals. He admits to restarting smoking after 18 months of abstaining and thinks it was his depression that led him to restarting. However, in November he made the decision to quit  again and really wants to stick to staying away from smoking. He wants to come to the program to work on his stamina, balance, and breathing    Expected Outcomes Short: attend pulmonary rehab for education and exercise. Long: develop and maintain positive self care habits.    Continue Psychosocial Services  Follow up required by staff             Psychosocial Re-Evaluation:   Psychosocial Discharge (Final Psychosocial Re-Evaluation):   Education: Education Goals: Education classes will be provided on a weekly basis, covering required topics. Participant will state understanding/return demonstration of topics presented.  Learning Barriers/Preferences:  Learning Barriers/Preferences - 08/25/23 1148       Learning Barriers/Preferences   Learning Barriers None    Learning Preferences None             General Pulmonary Education Topics:  Infection Prevention: - Provides verbal and written material to individual with discussion of infection control including proper hand washing and proper equipment cleaning during exercise session. Flowsheet Row Pulmonary Rehab from 08/26/2023 in [facility] Cardiac and Pulmonary Rehab  Date 08/26/23  Educator NT  Instruction Review Code 1- Verbalizes Understanding       Falls Prevention: - Provides verbal and written material to individual with discussion of falls prevention and safety. Flowsheet Row Pulmonary Rehab from 08/26/2023 in [facility] Cardiac and Pulmonary Rehab  Date 08/26/23  Educator NT  Instruction Review Code 1- Verbalizes Understanding       Chronic Lung Disease Review: - Group verbal instruction with posters, models, PowerPoint presentations and videos,  to review new updates, new respiratory medications, new advancements in procedures and treatments. Providing information on websites and "800" numbers for continued self-education. Includes information about supplement oxygen, available portable oxygen systems, continuous and  intermittent flow rates, oxygen safety, concentrators, and Medicare reimbursement for oxygen. Explanation of Pulmonary Drugs, including class, frequency, complications, importance of spacers, rinsing mouth after steroid MDI's, and proper cleaning methods for nebulizers. Review of basic lung anatomy and physiology related to function, structure, and complications of lung disease. Review of risk factors. Discussion about methods for diagnosing sleep apnea and types of masks and machines for OSA. Includes a review of the use of types of environmental controls: home humidity, furnaces, filters, dust mite/pet prevention, HEPA vacuums. Discussion about weather changes, air quality and the benefits of nasal washing. Instruction on Warning signs, infection symptoms, calling MD promptly, preventive modes, and value of vaccinations. Review of effective airway clearance, coughing and/or vibration techniques. Emphasizing that all should Create an Action Plan. Written material given at graduation. Flowsheet Row Pulmonary Rehab from 08/26/2023 in [facility] Cardiac and Pulmonary Rehab  Education need identified 08/26/23       AED/CPR: - Group verbal and written instruction with the use of models to demonstrate the basic use of the AED with the basic ABC's of resuscitation.    Anatomy and Cardiac Procedures: - Group verbal and visual presentation and models provide information about basic cardiac anatomy and function. Reviews the testing methods done to diagnose heart disease and the outcomes of the test results. Describes the treatment choices: Medical Management, Angioplasty, or Coronary Bypass Surgery for treating various heart conditions including Myocardial Infarction, Angina, Valve Disease, and Cardiac Arrhythmias.  Written material given at graduation. Flowsheet Row  Pulmonary Rehab from 04/17/2021 in [facility] Cardiac and Pulmonary Rehab  Date 04/17/21  Educator KB  Instruction Review Code 1- Verbalizes Understanding        Medication Safety: - Group verbal and visual instruction to review commonly prescribed medications for heart and lung disease. Reviews the medication, class of the drug, and side effects. Includes the steps to properly store meds and maintain the prescription regimen.  Written material given at graduation. Flowsheet Row Pulmonary Rehab from 08/26/2023 in [facility] Cardiac and Pulmonary Rehab  Education need identified 08/26/23       Other: -Provides group and verbal instruction on various topics (see comments)   Knowledge Questionnaire Score:  Knowledge Questionnaire Score - 08/26/23 1104       Knowledge Questionnaire Score   Pre Score 15/18              Core Components/Risk Factors/Patient Goals at Admission:  Personal Goals and Risk Factors at Admission - 08/25/23 1138       Core Components/Risk Factors/Patient Goals on Admission   Tobacco Cessation Yes   Quit November 2024   Intervention Offer Photographer, assist with locating and accessing local/national Quit Smoking programs, and support quit date choice.;Assist the participant in steps to quit. Provide individualized education and counseling about committing to Tobacco Cessation, relapse prevention, and pharmacological support that can be provided by physician.    Expected Outcomes Short Term: Will quit all tobacco product use, adhering to prevention of relapse plan.;Long Term: Complete abstinence from all tobacco products for at least 12 months from quit date.    Improve shortness of breath with ADL's Yes    Intervention Provide education, individualized exercise plan and daily activity instruction to help decrease symptoms of SOB with activities of daily living.    Expected Outcomes Short Term: Improve cardiorespiratory fitness to achieve a reduction of symptoms when performing ADLs;Long Term: Be able to perform more ADLs without symptoms or delay the onset of symptoms    Diabetes Yes    Intervention Provide  education about signs/symptoms and action to take for hypo/hyperglycemia.;Provide education about proper nutrition, including hydration, and aerobic/resistive exercise prescription along with prescribed medications to achieve blood glucose in normal ranges: Fasting glucose 65-99 mg/dL    Expected Outcomes Short Term: Participant verbalizes understanding of the signs/symptoms and immediate care of hyper/hypoglycemia, proper foot care and importance of medication, aerobic/resistive exercise and nutrition plan for blood glucose control.;Long Term: Attainment of HbA1C < 7%.    Hypertension Yes    Intervention Provide education on lifestyle modifcations including regular physical activity/exercise, weight management, moderate sodium restriction and increased consumption of fresh fruit, vegetables, and low fat dairy, alcohol moderation, and smoking cessation.;Monitor prescription use compliance.    Expected Outcomes Short Term: Continued assessment and intervention until BP is < 140/48mm HG in hypertensive participants. < 130/30mm HG in hypertensive participants with diabetes, heart failure or chronic kidney disease.;Long Term: Maintenance of blood pressure at goal levels.    Lipids Yes    Intervention Provide education and support for participant on nutrition & aerobic/resistive exercise along with prescribed medications to achieve LDL 70mg , HDL >40mg .    Expected Outcomes Short Term: Participant states understanding of desired cholesterol values and is compliant with medications prescribed. Participant is following exercise prescription and nutrition guidelines.;Long Term: Cholesterol controlled with medications as prescribed, with individualized exercise RX and with personalized nutrition plan. Value goals: LDL < 70mg , HDL > 40 mg.  Education:Diabetes - Individual verbal and written instruction to review signs/symptoms of diabetes, desired ranges of glucose level fasting, after meals and  with exercise. Acknowledge that pre and post exercise glucose checks will be done for 3 sessions at entry of program. Flowsheet Row Pulmonary Rehab from 08/26/2023 in [facility] Cardiac and Pulmonary Rehab  Date 08/25/23  Educator [facility]  Instruction Review Code 1- Verbalizes Understanding       Know Your Numbers and Heart Failure: - Group verbal and visual instruction to discuss disease risk factors for cardiac and pulmonary disease and treatment options.  Reviews associated critical values for Overweight/Obesity, Hypertension, Cholesterol, and Diabetes.  Discusses basics of heart failure: signs/symptoms and treatments.  Introduces Heart Failure Zone chart for action plan for heart failure.  Written material given at graduation.   Core Components/Risk Factors/Patient Goals Review:   Goals and Risk Factor Review     Row Name 09/06/23 1720             Core Components/Risk Factors/Patient Goals Review   Personal Goals Review Improve shortness of breath with ADL's       Review Spoke to patient about their shortness of breath and what they can do to improve. Patient has been informed of breathing techniques when starting the program. Patient is informed to tell staff if they have had any med changes and that certain meds they are taking or not taking can be causing shortness of breath.       Expected Outcomes Short: Attend [facility] regularly to improve shortness of breath with ADL's. Long: maintain independence with ADL's                Core Components/Risk Factors/Patient Goals at Discharge (Final Review):   Goals and Risk Factor Review - 09/06/23 1720       Core Components/Risk Factors/Patient Goals Review   Personal Goals Review Improve shortness of breath with ADL's    Review Spoke to patient about their shortness of breath and what they can do to improve. Patient has been informed of breathing techniques when starting the program. Patient is informed to tell staff if they have had any  med changes and that certain meds they are taking or not taking can be causing shortness of breath.    Expected Outcomes Short: Attend [facility] regularly to improve shortness of breath with ADL's. Long: maintain independence with ADL's             ITP Comments:  ITP Comments     Row Name 08/25/23 1144 08/26/23 0908 08/30/23 1728 09/01/23 1316 09/29/23 0838   ITP Comments Initial phone call completed. Diagnosis can be found in [facility] 11/20. EP Orientation scheduled for Thursday 1/9 at 8am. Completed and gym orientation. Initial ITP created and sent for review to Dr. Jinny Sanders, Medical Director. First full day of exercise!  Patient was oriented to gym and equipment including functions, settings, policies, and procedures.  Patient's individual exercise prescription and treatment plan were reviewed.  All starting workloads were established based on the results of the 6 minute walk test done at initial orientation visit.  The plan for exercise progression was also introduced and progression will be customized based on patient's performance and goals. 30 Day review completed. Medical Director ITP review done, changes made as directed, and signed approval by Medical Director. 30 Day review completed. Medical Director ITP review done, changes made as directed, and signed approval by Medical Director.  Comments: 30 day review

## 2023-09-29 NOTE — Progress Notes (Signed)
Daily Session Note  Patient Details  Name: Robert Combs MRN: [id number] Date of Birth: 03-01-1946 Referring Provider:   Flowsheet Row Pulmonary Rehab from 08/26/2023 in [facility] Cardiac and Pulmonary Rehab  Referring Provider Dr. Carlisle Cater       Encounter Date: 09/29/2023  Check In:  Session Check In - 09/29/23 1712       Check-In   Supervising physician immediately available to respond to emergencies See telemetry face sheet for immediately available ER MD    Location [facility]-Cardiac & Pulmonary Rehab    Staff Present Susann Givens RN,BSN;Joseph [facility] Stewardson BS, ACSM CEP, Exercise Physiologist    Virtual Visit No    Medication changes reported     No    Fall or balance concerns reported    No    Tobacco Cessation No Change    Current number of cigarettes/nicotine per day     1    Warm-up and Cool-down Performed on first and last piece of equipment    Resistance Training Performed Yes    VAD Patient? No    PAD/SET Patient? No                Social History   Tobacco Use  Smoking Status Former   Average packs/day: 1 pack/day for 25.0 years (25.0 ttl pk-yrs)   Types: Cigarettes   Start date: 07/12/1995   Quit date: 07/11/2020   Years since quitting: 3.2   Passive exposure: Current  Smokeless Tobacco Never  Tobacco Comments   02/03/21 currently at 1 ppd, [facility]    Goals Met:  Independence with exercise equipment Exercise tolerated well No report of concerns or symptoms today Strength training completed today  Goals Unmet:  Not Applicable  Comments: Pt able to follow exercise prescription today without complaint.  Will continue to monitor for progression.    Dr. Bethann Punches is Medical Director for [facility] Cardiac Rehabilitation.  Dr. Vida Rigger is Medical Director for [facility] Pulmonary Rehabilitation.

## 2023-09-30 ENCOUNTER — Encounter: Payer: No Typology Code available for payment source | Admitting: *Deleted

## 2023-09-30 DIAGNOSIS — J449 Chronic obstructive pulmonary disease, unspecified: Secondary | ICD-10-CM | POA: Diagnosis not present

## 2023-09-30 NOTE — Progress Notes (Signed)
Daily Session Note  Patient Details  Name: Robert Combs MRN: [id number] Date of Birth: 12/22/1945 Referring Provider:   Flowsheet Row Pulmonary Rehab from 08/26/2023 in [facility] Cardiac and Pulmonary Rehab  Referring Provider Dr. Carlisle Cater       Encounter Date: 09/30/2023  Check In:  Session Check In - 09/30/23 1704       Check-In   Supervising physician immediately available to respond to emergencies See telemetry face sheet for immediately available ER MD    Location [facility]-Cardiac & Pulmonary Rehab    Staff Present Maxon Conetta BS, Exercise Physiologist;Veida Spira Jewel Baize RN,BSN;Joseph Hood RCP,RRT,BSRT    Virtual Visit No    Medication changes reported     No    Fall or balance concerns reported    No    Tobacco Cessation Use Increase    Current number of cigarettes/nicotine per day     2    Warm-up and Cool-down Performed on first and last piece of equipment    Resistance Training Performed Yes    VAD Patient? No    PAD/SET Patient? No      Pain Assessment   Currently in Pain? No/denies                Social History   Tobacco Use  Smoking Status Former   Average packs/day: 1 pack/day for 25.0 years (25.0 ttl pk-yrs)   Types: Cigarettes   Start date: 07/12/1995   Quit date: 07/11/2020   Years since quitting: 3.2   Passive exposure: Current  Smokeless Tobacco Never  Tobacco Comments   02/03/21 currently at 1 ppd, [facility] and [facility] is [facility] it    Goals Met:  Independence with exercise equipment Exercise tolerated well No report of concerns or symptoms today Strength training completed today  Goals Unmet:  Not Applicable  Comments: Pt able to follow exercise prescription today without complaint.  Will continue to monitor for progression.    Dr. Bethann Punches is Medical Director for [facility] Cardiac Rehabilitation.  Dr. Vida Rigger is Medical Director for [facility] Pulmonary Rehabilitation.

## 2023-10-04 ENCOUNTER — Encounter: Payer: No Typology Code available for payment source | Admitting: *Deleted

## 2023-10-04 DIAGNOSIS — J449 Chronic obstructive pulmonary disease, unspecified: Secondary | ICD-10-CM | POA: Diagnosis not present

## 2023-10-04 NOTE — Progress Notes (Signed)
Daily Session Note  Patient Details  Name: Robert Combs MRN: [id number] Date of Birth: October 05, 1945 Referring Provider:   Flowsheet Row Pulmonary Rehab from 08/26/2023 in [facility] Cardiac and Pulmonary Rehab  Referring Provider Dr. Carlisle Cater       Encounter Date: 10/04/2023  Check In:  Session Check In - 10/04/23 1709       Check-In   Supervising physician immediately available to respond to emergencies See telemetry face sheet for immediately available ER MD    Location [facility]-Cardiac & Pulmonary Rehab    Staff Present Susann Givens RN,BSN;Joseph [facility] [place] BS, ACSM CEP, Exercise Physiologist    Virtual Visit No    Medication changes reported     Yes    Comments added ozempic    Fall or balance concerns reported    No    Tobacco Cessation Use Decreased    Current number of cigarettes/nicotine per day     0    Warm-up and Cool-down Performed on first and last piece of equipment    Resistance Training Performed Yes    VAD Patient? No    PAD/SET Patient? No      Pain Assessment   Currently in Pain? No/denies                Social History   Tobacco Use  Smoking Status Former   Average packs/day: 1 pack/day for 25.0 years (25.0 ttl pk-yrs)   Types: Cigarettes   Start date: 07/12/1995   Quit date: 07/11/2020   Years since quitting: 3.2   Passive exposure: Current  Smokeless Tobacco Never  Tobacco Comments   02/03/21 currently at 1 ppd, [facility] is [facility]    Goals Met:  Independence with exercise equipment Exercise tolerated well Personal goals reviewed No report of concerns or symptoms today Strength training completed today  Goals Unmet:  Not Applicable  Comments: Pt able to follow exercise prescription today without complaint.  Will continue to monitor for progression.  Pulmonary:  Reviewed home exercise with pt today from 5:25 pm to 5:37 pm.  Pt plans to use his recumbent elliptical at home for  exercise.  Reviewed THR, pulse, RPE, sign and symptoms, pulse oximetery and when to call 911 or MD.  Also discussed weather considerations and indoor options.  Pt voiced understanding.    Dr. Bethann Punches is Medical Director for [facility] Cardiac Rehabilitation.  Dr. Vida Rigger is Medical Director for [facility] Pulmonary Rehabilitation.

## 2023-10-11 ENCOUNTER — Encounter: Payer: No Typology Code available for payment source | Admitting: *Deleted

## 2023-10-11 DIAGNOSIS — J449 Chronic obstructive pulmonary disease, unspecified: Secondary | ICD-10-CM | POA: Diagnosis not present

## 2023-10-11 NOTE — Progress Notes (Signed)
 Daily Session Note  Patient Details  Name: Robert Combs MRN: [id number] Date of Birth: December 10, 1945 Referring Provider:   Flowsheet Row Pulmonary Rehab from 08/26/2023 in [facility] Cardiac and Pulmonary Rehab  Referring Provider Dr. Carlisle Cater       Encounter Date: 10/11/2023  Check In:  Session Check In - 10/11/23 1656       Check-In   Supervising physician immediately available to respond to emergencies See telemetry face sheet for immediately available ER MD    Location [facility]-Cardiac & Pulmonary Rehab    Staff Present Susann Givens RN,BSN;Joseph [facility] [place] BS, ACSM CEP, Exercise Physiologist    Virtual Visit No    Medication changes reported     No    Fall or balance concerns reported    No    Tobacco Cessation No Change    Warm-up and Cool-down Performed on first and last piece of equipment    Resistance Training Performed Yes    VAD Patient? No    PAD/SET Patient? No      Pain Assessment   Currently in Pain? No/denies                Social History   Tobacco Use  Smoking Status Former   Average packs/day: 1 pack/day for 25.0 years (25.0 ttl pk-yrs)   Types: Cigarettes   Start date: 07/12/1995   Quit date: 07/11/2020   Years since quitting: 3.2   Passive exposure: Current  Smokeless Tobacco Never  Tobacco Comments   02/03/21 currently at 1 ppd, [facility] and [facility] is [facility] it    Goals Met:  Independence with exercise equipment Exercise tolerated well No report of concerns or symptoms today  Goals Unmet:  Not Applicable  Comments: Pt able to follow exercise prescription today without complaint.  Will continue to monitor for progression.    Dr. Bethann Punches is Medical Director for [facility] Cardiac Rehabilitation.  Dr. Vida Rigger is Medical Director for [facility] Pulmonary Rehabilitation.

## 2023-10-13 ENCOUNTER — Encounter: Payer: No Typology Code available for payment source | Admitting: *Deleted

## 2023-10-13 DIAGNOSIS — J449 Chronic obstructive pulmonary disease, unspecified: Secondary | ICD-10-CM | POA: Diagnosis not present

## 2023-10-13 NOTE — Progress Notes (Signed)
 Daily Session Note  Patient Details  Name: Robert Combs MRN: [id number] Date of Birth: 1946-01-22 Referring Provider:   Flowsheet Row Pulmonary Rehab from 08/26/2023 in [facility] Cardiac and Pulmonary Rehab  Referring Provider Dr. Carlisle Cater       Encounter Date: 10/13/2023  Check In:  Session Check In - 10/13/23 1716       Check-In   Supervising physician immediately available to respond to emergencies See telemetry face sheet for immediately available ER MD    Location [facility]-Cardiac & Pulmonary Rehab    Staff Present Susann Givens RN,BSN;Joseph [facility] BS, ACSM CEP, Exercise Physiologist    Virtual Visit No    Medication changes reported     No    Fall or balance concerns reported    No    Warm-up and Cool-down Performed on first and last piece of equipment    Resistance Training Performed Yes    VAD Patient? No    PAD/SET Patient? No      Pain Assessment   Currently in Pain? No/denies                Social History   Tobacco Use  Smoking Status Former   Average packs/day: 1 pack/day for 25.0 years (25.0 ttl pk-yrs)   Types: Cigarettes   Start date: 07/12/1995   Quit date: 07/11/2020   Years since quitting: 3.2   Passive exposure: Current  Smokeless Tobacco Never  Tobacco Comments   02/03/21 currently at 1 ppd, [facility] and [facility] is [facility] it    Goals Met:  Independence with exercise equipment Exercise tolerated well No report of concerns or symptoms today Strength training completed today  Goals Unmet:  Not Applicable  Comments: Pt able to follow exercise prescription today without complaint.  Will continue to monitor for progression.    Dr. Bethann Punches is Medical Director for [facility] Cardiac Rehabilitation.  Dr. Vida Rigger is Medical Director for [facility] Pulmonary Rehabilitation.

## 2023-10-18 DIAGNOSIS — J449 Chronic obstructive pulmonary disease, unspecified: Secondary | ICD-10-CM | POA: Insufficient documentation

## 2023-10-20 ENCOUNTER — Telehealth: Payer: Self-pay

## 2023-10-20 DIAGNOSIS — J449 Chronic obstructive pulmonary disease, unspecified: Secondary | ICD-10-CM

## 2023-10-20 NOTE — Progress Notes (Signed)
 Patient states he has a bulging disc and has had an MRI. His doctor suggested he waits to resume rehab until he has talked to a Careers adviser. Informed patient that he will be put on a medical hold and to call back when he is able to speak with the surgeon.

## 2023-10-20 NOTE — Telephone Encounter (Signed)
 Patient states he has a bulging disc and has had an MRI. His doctor suggested he waits to resume rehab until he has talked to a Careers adviser. Informed patient that he will be put on a medical hold and to call back when he is able to speak with the surgeon.

## 2023-10-22 ENCOUNTER — Inpatient Hospital Stay
Admission: RE | Admit: 2023-10-22 | Discharge: 2023-10-22 | Disposition: A | Discharge status: Home / Self Care | Payer: Self-pay | Source: Ambulatory Visit | Attending: Neurosurgery | Admitting: Neurosurgery

## 2023-10-22 ENCOUNTER — Other Ambulatory Visit: Payer: Self-pay

## 2023-10-22 DIAGNOSIS — Z049 Encounter for examination and observation for unspecified reason: Secondary | ICD-10-CM

## 2023-10-27 DIAGNOSIS — J449 Chronic obstructive pulmonary disease, unspecified: Secondary | ICD-10-CM

## 2023-10-27 NOTE — Progress Notes (Signed)
 Pulmonary Individual Treatment Plan  Patient Details  Name: Robert Combs MRN: [id number] Date of Birth: 1946-01-03 Referring Provider:   Flowsheet Row Pulmonary Rehab from 08/26/2023 in [facility] Cardiac and Pulmonary Rehab  Referring Provider Dr. Carlisle Cater       Initial Encounter Date:  Flowsheet Row Pulmonary Rehab from 08/26/2023 in [facility] Cardiac and Pulmonary Rehab  Date 08/26/23       Visit Diagnosis: Chronic obstructive pulmonary disease, unspecified COPD type (HCC)  Patient's Home Medications on Admission:  Current Outpatient Medications:    albuterol (PROVENTIL HFA;VENTOLIN HFA) 108 (90 Base) MCG/ACT inhaler, Inhale 1 puff into the lungs every 6 (six) hours as needed for wheezing or shortness of breath., Disp: , Rfl:    albuterol (VENTOLIN HFA) 108 (90 Base) MCG/ACT inhaler, Inhale into the lungs., Disp: , Rfl:    Alogliptin Benzoate 25 MG TABS, Take by mouth., Disp: , Rfl:    Ascorbic Acid (VITAMIN C) 100 MG tablet, Take 100 mg by mouth daily., Disp: , Rfl:    aspirin EC 81 MG tablet, Take 81 mg by mouth daily.  , Disp: , Rfl:    atorvastatin (LIPITOR) 40 MG tablet, Take 40 mg by mouth daily., Disp: , Rfl:    azelastine (ASTELIN) 0.1 % nasal spray, Place 2 sprays into both nostrils 2 (two) times daily as needed for rhinitis. Use in each nostril as directed, Disp: 30 mL, Rfl: 5   budesonide-formoterol (SYMBICORT) 160-4.5 MCG/ACT inhaler, Inhale into the lungs. (Patient not taking: Reported on 08/25/2023), Disp: , Rfl:    cholecalciferol (VITAMIN D) 1000 UNITS tablet, Take 2,000 Units by mouth daily., Disp: , Rfl:    fluticasone (FLONASE) 50 MCG/ACT nasal spray, Place 2 sprays into both nostrils daily. (Patient not taking: Reported on 08/25/2023), Disp: 16 g, Rfl: 5   folic acid (FOLVITE) 1 MG tablet, Take 1 mg by mouth daily., Disp: , Rfl:    gabapentin (NEURONTIN) 300 MG capsule, Take 300 mg by mouth.  TAKE ONE CAPSULE BY MOUTH EVERY MORNING AND TAKE ONE CAPSULE AT NOON AND TAKE  TWO CAPSULES EVERY EVENING FOR NERVE PAIN/BURNING, Disp: , Rfl:    hydrochlorothiazide (HYDRODIURIL) 25 MG tablet, Take 1 tablet by mouth every morning., Disp: , Rfl:    losartan (COZAAR) 100 MG tablet, Take 100 mg by mouth daily., Disp: , Rfl:    Magnesium Oxide 420 MG TABS, Take 420 mg by mouth daily., Disp: , Rfl:    Meclizine HCl 25 MG CHEW, Chew by mouth.  CHEW 1-2 TABLETS BY MOUTH 1 HOUR BEFORE BEDTIME FOR DIZZINESS AND SLEEP; NO DRIVING FOR 6 HOURS AFTER TAKING, Disp: , Rfl:    metFORMIN (GLUCOPHAGE-XR) 750 MG 24 hr tablet, Take 750 mg by mouth daily with breakfast., Disp: , Rfl:    mometasone (ASMANEX) 220 MCG/INH inhaler, Take by mouth., Disp: , Rfl:    Olodaterol HCl 2.5 MCG/ACT AERS, Take by mouth., Disp: , Rfl:    omeprazole (PRILOSEC OTC) 20 MG tablet, Take 20 mg by mouth daily. (Patient not taking: Reported on 08/25/2023), Disp: , Rfl:    predniSONE (DELTASONE) 10 MG tablet, 6-day taper; 6 pills day one, 5 pills day 2, 4 pills day 3, 3 pills on day 4, 2 pills on day 5, 1 pill on day 6. Follow package instructions., Disp: , Rfl:    rOPINIRole (REQUIP) 0.5 MG tablet, Take by mouth., Disp: , Rfl:    rosuvastatin (CRESTOR) 20 MG tablet, Take 1 tablet (20 mg total) by mouth  daily., Disp: 90 tablet, Rfl: 3   tamsulosin (FLOMAX) 0.4 MG CAPS capsule, Take 1 capsule by mouth daily., Disp: , Rfl:    traMADol (ULTRAM) 50 MG tablet, Take 1 tablet (50 mg total) by mouth every 6 (six) hours as needed., Disp: 10 tablet, Rfl: 0   vardenafil (LEVITRA) 20 MG tablet, Take 10 mg by mouth., Disp: , Rfl:   Past Medical History: Past Medical History:  Diagnosis Date   Chronic kidney disease    COPD (chronic obstructive pulmonary disease) (HCC)    Coronary artery disease    DDD (degenerative disc disease)    Diabetes mellitus without complication (HCC)    Hyperlipidemia    Hypertension     Tobacco Use: Social History   Tobacco Use  Smoking Status Former   Average packs/day: 1 pack/day for 25.0  years (25.0 ttl pk-yrs)   Types: Cigarettes   Start date: 07/12/1995   Quit date: 07/11/2020   Years since quitting: 3.2   Passive exposure: Current  Smokeless Tobacco Never  Tobacco Comments   02/03/21 currently at 1 ppd, [facility] is [facility]    Labs: Review Flowsheet       Latest Ref Rng & Units 02/03/2010 11/04/2015  Labs for ITP Cardiac and Pulmonary Rehab  Bicarbonate 20.0 - 24.0 mEq/L - 26.3   TCO2 0 - 100 mmol/L 31  28   O2 Saturation % - 75.0      Pulmonary Assessment Scores:  Pulmonary Assessment Scores     Row Name 08/26/23 1105         ADL UCSD   ADL Phase Entry     SOB Score total 55     Rest 0     Walk 0     Stairs 5     Bath 0     Dress 0     Shop 0       CAT Score   CAT Score 26       mMRC Score   mMRC Score 1              UCSD: Self-administered rating of dyspnea associated with activities of daily living (ADLs) 6-point scale (0 = "not at all" to 5 = "maximal or unable to do because of breathlessness")  Scoring Scores range from 0 to 120.  Minimally important difference is 5 units  CAT: CAT can identify the health impairment of COPD patients and is better correlated with disease progression.  CAT has a scoring range of zero to 40. The CAT score is classified into four groups of low (less than 10), medium (10 - 20), high (21-30) and very high (31-40) based on the impact level of disease on health status. A CAT score over 10 suggests significant symptoms.  A worsening CAT score could be explained by an exacerbation, poor medication adherence, poor inhaler technique, or progression of COPD or comorbid conditions.  CAT MCID is 2 points  mMRC: mMRC (Modified Medical Research Council) Dyspnea Scale is used to assess the degree of baseline functional disability in patients of respiratory disease due to dyspnea. No minimal important difference is established. A decrease in score of 1 point or greater is considered a positive  change.   Pulmonary Function Assessment:   Exercise Target Goals: Exercise Program Goal: Individual exercise prescription set using results from initial 6 min walk test and THRR while considering  patient's activity barriers and safety.   Exercise Prescription Goal: Initial  exercise prescription builds to 30-45 minutes a day of aerobic activity, 2-3 days per week.  Home exercise guidelines will be given to patient during program as part of exercise prescription that the participant will acknowledge.  Education: Aerobic Exercise: - Group verbal and visual presentation on the components of exercise prescription. Introduces F.I.T.T principle from ACSM for exercise prescriptions.  Reviews F.I.T.T. principles of aerobic exercise including progression. Written material given at graduation. Flowsheet Row Pulmonary Rehab from 04/17/2021 in [facility] Cardiac and Pulmonary Rehab  Date 02/06/21  Educator [facility]  Instruction Review Code 1- Verbalizes Understanding       Education: Resistance Exercise: - Group verbal and visual presentation on the components of exercise prescription. Introduces F.I.T.T principle from ACSM for exercise prescriptions  Reviews F.I.T.T. principles of resistance exercise including progression. Written material given at graduation. Flowsheet Row Pulmonary Rehab from 04/17/2021 in [facility] Cardiac and Pulmonary Rehab  Date 04/17/21  Educator [facility]  Instruction Review Code 1- Verbalizes Understanding        Education: Exercise & Equipment Safety: - Individual verbal instruction and demonstration of equipment use and safety with use of the equipment. Flowsheet Row Pulmonary Rehab from 08/26/2023 in [facility] Cardiac and Pulmonary Rehab  Date 08/26/23  Educator [facility]  Instruction Review Code 1- Verbalizes Understanding       Education: Exercise Physiology & General Exercise Guidelines: - Group verbal and written instruction with models to review the exercise physiology of the cardiovascular  system and associated critical values. Provides general exercise guidelines with specific guidelines to those with heart or lung disease.    Education: Flexibility, Balance, Mind/Body Relaxation: - Group verbal and visual presentation with interactive activity on the components of exercise prescription. Introduces F.I.T.T principle from ACSM for exercise prescriptions. Reviews F.I.T.T. principles of flexibility and balance exercise training including progression. Also discusses the mind body connection.  Reviews various relaxation techniques to help reduce and manage stress (i.e. Deep breathing, progressive muscle relaxation, and visualization). Balance handout provided to take home. Written material given at graduation. Flowsheet Row Pulmonary Rehab from 04/17/2021 in [facility] Cardiac and Pulmonary Rehab  Date 02/20/21  Educator [facility]  Instruction Review Code 1- Verbalizes Understanding       Activity Barriers & Risk Stratification:  Activity Barriers & Cardiac Risk Stratification - 08/25/23 1135       Activity Barriers & Cardiac Risk Stratification   Activity Barriers Shortness of Breath;Deconditioning;Muscular Weakness;Balance Concerns   neuropathy (right leg worse)            6 Minute Walk:  6 Minute Walk     Row Name 08/26/23 0908         6 Minute Walk   Phase Initial     Distance 1340 feet     Walk Time 6 minutes     # of Rest Breaks 0     MPH 2.54     METS 2.49     RPE 13     Perceived Dyspnea  3     VO2 Peak 8.71     Symptoms Yes (comment)     Comments leg weakness     Resting HR 58 bpm     Resting BP 124/62     Resting Oxygen Saturation  94 %     Exercise Oxygen Saturation  during 6 min walk 88 %     Max Ex. HR 91 bpm     Max Ex. BP 136/64     2 Minute Post BP 126/58  Interval HR   1 Minute HR 63     2 Minute HR 73     3 Minute HR 70     4 Minute HR 78     5 Minute HR 81     6 Minute HR 91     2 Minute Post HR 63     Interval Heart Rate? Yes        Interval Oxygen   Interval Oxygen? Yes     Baseline Oxygen Saturation % 94 %     1 Minute Oxygen Saturation % 92 %     1 Minute Liters of Oxygen 0 L  RA     2 Minute Oxygen Saturation % 90 %     2 Minute Liters of Oxygen 0 L     3 Minute Oxygen Saturation % 88 %     3 Minute Liters of Oxygen 0 L     4 Minute Oxygen Saturation % 90 %     4 Minute Liters of Oxygen 0 L     5 Minute Oxygen Saturation % 89 %     5 Minute Liters of Oxygen 0 L     6 Minute Oxygen Saturation % 88 %     6 Minute Liters of Oxygen 0 L     2 Minute Post Oxygen Saturation % 96 %     2 Minute Post Liters of Oxygen 0 L             Oxygen Initial Assessment:  Oxygen Initial Assessment - 08/25/23 1148       Home Oxygen   Home Oxygen Device None    Sleep Oxygen Prescription None    Home Exercise Oxygen Prescription None    Home Resting Oxygen Prescription None    Compliance with Home Oxygen Use Yes      Intervention   Short Term Goals To learn and understand importance of monitoring SPO2 with pulse oximeter and demonstrate accurate use of the pulse oximeter.;To learn and understand importance of maintaining oxygen saturations>88%;To learn and demonstrate proper pursed lip breathing techniques or other breathing techniques. ;To learn and demonstrate proper use of respiratory medications    Long  Term Goals Verbalizes importance of monitoring SPO2 with pulse oximeter and return demonstration;Maintenance of O2 saturations>88%;Exhibits proper breathing techniques, such as pursed lip breathing or other method taught during program session;Compliance with respiratory medication             Oxygen Re-Evaluation:  Oxygen Re-Evaluation     Row Name 08/30/23 1729 09/06/23 1712 10/04/23 1739         Program Oxygen Prescription   Program Oxygen Prescription -- None None       Home Oxygen   Home Oxygen Device -- None None     Sleep Oxygen Prescription -- None None     Home Exercise Oxygen Prescription --  None None     Home Resting Oxygen Prescription -- None None     Compliance with Home Oxygen Use -- -- Yes       Goals/Expected Outcomes   Short Term Goals -- To learn and demonstrate proper pursed lip breathing techniques or other breathing techniques.  To learn and demonstrate proper pursed lip breathing techniques or other breathing techniques. ;To learn and understand importance of maintaining oxygen saturations>88%;To learn and understand importance of monitoring SPO2 with pulse oximeter and demonstrate accurate use of the pulse oximeter.     Long  Term Goals -- Exhibits  proper breathing techniques, such as pursed lip breathing or other method taught during program session Maintenance of O2 saturations>88%;Verbalizes importance of monitoring SPO2 with pulse oximeter and return demonstration;Exhibits proper breathing techniques, such as pursed lip breathing or other method taught during program session     Comments Reviewed PLB technique with pt.  Talked about how it works and it's importance in maintaining their exercise saturations. Informed patient how to perform the Pursed Lipped breathing technique. Told patient to Inhale through the nose and out the mouth with pursed lips to keep their airways open, help oxygenate them better, practice when at rest or doing strenuous activity. Patient Verbalizes understanding of technique and will work on and be reiterated during [facility]. Misha does not currently own a pulse oxymeter. He was encouraged to get one to be able to monitor his heart rate and oxygen saturations at home, specifically during home exercise. [facility] one. He does state that he uses PLB techniques to help control SOB and improve SaO2 numbers.     Goals/Expected Outcomes Short: Become more profiecient at using PLB.   Long: Become independent at using PLB. Short: use PLB with exertion. Long: use PLB on exertion proficiently and  independently. Short: talk to [place] about getting a pulse oxymeter. Long: monitor SaO2 and use PLB independently to control SOB              Oxygen Discharge (Final Oxygen Re-Evaluation):  Oxygen Re-Evaluation - 10/04/23 1739       Program Oxygen Prescription   Program Oxygen Prescription None      Home Oxygen   Home Oxygen Device None    Sleep Oxygen Prescription None    Home Exercise Oxygen Prescription None    Home Resting Oxygen Prescription None    Compliance with Home Oxygen Use Yes      Goals/Expected Outcomes   Short Term Goals To learn and demonstrate proper pursed lip breathing techniques or other breathing techniques. ;To learn and understand importance of maintaining oxygen saturations>88%;To learn and understand importance of monitoring SPO2 with pulse oximeter and demonstrate accurate use of the pulse oximeter.    Long  Term Goals Maintenance of O2 saturations>88%;Verbalizes importance of monitoring SPO2 with pulse oximeter and return demonstration;Exhibits proper breathing techniques, such as pursed lip breathing or other method taught during program session    Comments Dolan does not currently own a pulse oxymeter. He was encouraged to get one to be able to monitor his heart rate and oxygen saturations at home, specifically during home exercise. [facility] stated [facility] one. He does state that he uses PLB techniques to help control SOB and improve SaO2 numbers.    Goals/Expected Outcomes Short: talk to [place] about getting a pulse oxymeter. Long: monitor SaO2 and use PLB independently to control SOB             Initial Exercise Prescription:  Initial Exercise Prescription - 08/26/23 1100       Date of Initial Exercise RX and Referring Provider   Date 08/26/23    Referring Provider Dr. Carlisle Cater      Oxygen   Maintain Oxygen Saturation 88% or higher      Treadmill   MPH 2    Grade 0    Minutes 15    METs 2.53       NuStep   Level 2  SPM 80    Minutes 15    METs 2.49      REL-XR   Level 2    Speed 50    Minutes 15    METs 2.49      Prescription Details   Frequency (times per week) 3    Duration Progress to 30 minutes of continuous aerobic without signs/symptoms of physical distress      Intensity   THRR 40-80% of Max Heartrate 92-127    Ratings of Perceived Exertion 11-13    Perceived Dyspnea 0-4      Progression   Progression Continue to progress workloads to maintain intensity without signs/symptoms of physical distress.      Resistance Training   Training Prescription Yes    Weight 7 lb    Reps 10-15             Perform Capillary Blood Glucose checks as needed.  Exercise Prescription Changes:   Exercise Prescription Changes     Row Name 08/26/23 1100 09/16/23 1200 10/04/23 1700 10/14/23 1500       Response to Exercise   Blood Pressure (Admit) 124/62 142/70 -- 148/87    Blood Pressure (Exercise) 136/64 156/82 -- 152/82    Blood Pressure (Exit) 126/58 138/70 -- 118/70    Heart Rate (Admit) 58 bpm 63 bpm -- 68 bpm    Heart Rate (Exercise) 91 bpm 103 bpm -- 103 bpm    Heart Rate (Exit) 63 bpm 70 bpm -- 74 bpm    Oxygen Saturation (Admit) 94 % 93 % -- 91 %    Oxygen Saturation (Exercise) 88 % 90 % -- 88 %    Oxygen Saturation (Exit) 96 % 94 % -- 91 %    Rating of Perceived Exertion (Exercise) 13 13 -- 13    Perceived Dyspnea (Exercise) 3 2 -- 1    Symptoms leg weakness none -- none    Comments Results First 2 weeks of exercise -- --    Duration -- Progress to 30 minutes of  aerobic without signs/symptoms of physical distress Progress to 30 minutes of  aerobic without signs/symptoms of physical distress Continue with 30 min of aerobic exercise without signs/symptoms of physical distress.    Intensity -- THRR unchanged THRR unchanged THRR unchanged      Progression   Progression -- Continue to progress workloads to maintain intensity without signs/symptoms of  physical distress. Continue to progress workloads to maintain intensity without signs/symptoms of physical distress. Continue to progress workloads to maintain intensity without signs/symptoms of physical distress.    Average METs -- 2.76 2.76 2.67      Resistance Training   Training Prescription -- Yes Yes Yes    Weight -- 7lb 7lb 7 lb    Reps -- 10-15 10-15 10-15      Interval Training   Interval Training -- No No No      Treadmill   MPH -- 3.5 3.5 2.5    Grade -- 0 0 0    Minutes -- 15 15 15     METs -- 3.68 3.68 2.91      NuStep   Level -- 5 5 5     Minutes -- 15 15 15     METs -- 2.5 2.5 2      REL-XR   Level -- 2 2 2     Minutes -- 15 15 15     METs -- 3 3 3.5      Home Exercise Plan  Plans to continue exercise at -- -- Home (comment)  Rec. Elliptical Home (comment)  Rec. Elliptical    Frequency -- -- Add 2 additional days to program exercise sessions. Add 2 additional days to program exercise sessions.    Initial Home Exercises Provided -- -- 10/04/23 10/04/23      Oxygen   Maintain Oxygen Saturation -- 88% or higher 88% or higher 88% or higher             Exercise Comments:   Exercise Comments     Row Name 08/30/23 1728           Exercise Comments First full day of exercise!  Patient was oriented to gym and equipment including functions, settings, policies, and procedures.  Patient's individual exercise prescription and treatment plan were reviewed.  All starting workloads were established based on the results of the 6 minute walk test done at initial orientation visit.  The plan for exercise progression was also introduced and progression will be customized based on patient's performance and goals.                Exercise Goals and Review:   Exercise Goals     Row Name 08/26/23 0910             Exercise Goals   Increase Physical Activity Yes       Intervention Provide advice, education, support and counseling about physical activity/exercise  needs.;Develop an individualized exercise prescription for aerobic and resistive training based on initial evaluation findings, risk stratification, comorbidities and participant's personal goals.       Expected Outcomes Short Term: Attend rehab on a regular basis to increase amount of physical activity.;Long Term: Add in home exercise to make exercise part of routine and to increase amount of physical activity.;Long Term: Exercising regularly at least 3-5 days a week.       Increase Strength and Stamina Yes       Intervention Provide advice, education, support and counseling about physical activity/exercise needs.;Develop an individualized exercise prescription for aerobic and resistive training based on initial evaluation findings, risk stratification, comorbidities and participant's personal goals.       Expected Outcomes Short Term: Increase workloads from initial exercise prescription for resistance, speed, and METs.;Short Term: Perform resistance training exercises routinely during rehab and add in resistance training at home;Long Term: Improve cardiorespiratory fitness, muscular endurance and strength as measured by increased METs and functional capacity ( )       Able to understand and use rate of perceived exertion (RPE) scale Yes       Intervention Provide education and explanation on how to use RPE scale       Expected Outcomes Short Term: Able to use RPE daily in rehab to express subjective intensity level;Long Term:  Able to use RPE to guide intensity level when exercising independently       Able to understand and use Dyspnea scale Yes       Intervention Provide education and explanation on how to use Dyspnea scale       Expected Outcomes Short Term: Able to use Dyspnea scale daily in rehab to express subjective sense of shortness of breath during exertion;Long Term: Able to use Dyspnea scale to guide intensity level when exercising independently       Knowledge and understanding of  Target Heart Rate Range (THRR) Yes       Intervention Provide education and explanation of THRR including how the numbers were predicted and where  they are located for reference       Expected Outcomes Short Term: Able to use daily as guideline for intensity in rehab;Short Term: Able to state/look up THRR;Long Term: Able to use THRR to govern intensity when exercising independently       Able to check pulse independently Yes       Intervention Provide education and demonstration on how to check pulse in carotid and radial arteries.;Review the importance of being able to check your own pulse for safety during independent exercise       Expected Outcomes Short Term: Able to explain why pulse checking is important during independent exercise;Long Term: Able to check pulse independently and accurately       Understanding of Exercise Prescription Yes       Intervention Provide education, explanation, and written materials on patient's individual exercise prescription       Expected Outcomes Short Term: Able to explain program exercise prescription;Long Term: Able to explain home exercise prescription to exercise independently                Exercise Goals Re-Evaluation :  Exercise Goals Re-Evaluation     Row Name 08/30/23 1728 09/16/23 1246 10/04/23 1737 10/14/23 1503       Exercise Goal Re-Evaluation   Exercise Goals Review Able to understand and use rate of perceived exertion (RPE) scale;Increase Physical Activity;Knowledge and understanding of Target Heart Rate Range (THRR);Understanding of Exercise Prescription;Increase Strength and Stamina;Able to check pulse independently Increase Physical Activity;Increase Strength and Stamina;Understanding of Exercise Prescription Increase Strength and Stamina;Increase Physical Activity;Able to understand and use rate of perceived exertion (RPE) scale;Able to understand and use Dyspnea scale;Knowledge and understanding of Target Heart Rate Range (THRR);Able  to check pulse independently;Understanding of Exercise Prescription Increase Physical Activity;Increase Strength and Stamina;Understanding of Exercise Prescription    Comments Reviewed RPE and dyspnea scale, THR and program prescription with pt today.  Pt voiced understanding and was given a copy of goals to take home. Cadence is off to a good start in the program. He was able to attend his first few sessions of rehab during this review. During his sessions he was able to increase his treadmill workload from his prescribed workload of to 3.55mph. He was also able to increase his level on the T4 nustep from his prescribed level 2 to a level 5. We will continue to monitor his improvements and progress in the program. Reviewed home exercise with pt today from 5:25 pm to 5:37 pm.  Pt plans to use his recumbent elliptical at home for exercise.  Reviewed THR, pulse, RPE, sign and symptoms, pulse oximetery and when to call 911 or MD.  Also discussed weather considerations and indoor options.  Pt voiced understanding. Gerold is doing well in rehab. He consistently works at level 5 on the T4 nustep and level 2 on the XR. He did see a decrease in his treadmill workload with hi speed decreasing to 2.5 mph with no incline. We will continue to monitor his progress in the program.    Expected Outcomes Short: Use RPE daily to regulate intensity.  Long: Follow program prescription in THR. Short: COntinue to increase treadmill workload. Long: Continue exercise to increase strength and stamina. Short: add 1-2 days a week of exercise at home on off days of pulmonary rehab. Long: maintain independent exercise routine. Short: Increase treadmill workload back up to previous level. Long: Continue exercise to improve strength and stamina.  Discharge Exercise Prescription (Final Exercise Prescription Changes):  Exercise Prescription Changes - 10/14/23 1500       Response to Exercise   Blood Pressure (Admit)  148/87    Blood Pressure (Exercise) 152/82    Blood Pressure (Exit) 118/70    Heart Rate (Admit) 68 bpm    Heart Rate (Exercise) 103 bpm    Heart Rate (Exit) 74 bpm    Oxygen Saturation (Admit) 91 %    Oxygen Saturation (Exercise) 88 %    Oxygen Saturation (Exit) 91 %    Rating of Perceived Exertion (Exercise) 13    Perceived Dyspnea (Exercise) 1    Symptoms none    Duration Continue with 30 min of aerobic exercise without signs/symptoms of physical distress.    Intensity THRR unchanged      Progression   Progression Continue to progress workloads to maintain intensity without signs/symptoms of physical distress.    Average METs 2.67      Resistance Training   Training Prescription Yes    Weight 7 lb    Reps 10-15      Interval Training   Interval Training No      Treadmill   MPH 2.5    Grade 0    Minutes 15    METs 2.91      NuStep   Level 5    Minutes 15    METs 2      REL-XR   Level 2    Minutes 15    METs 3.5      Home Exercise Plan   Plans to continue exercise at Home (comment)   Rec. Elliptical   Frequency Add 2 additional days to program exercise sessions.    Initial Home Exercises Provided 10/04/23      Oxygen   Maintain Oxygen Saturation 88% or higher             Nutrition:  Target Goals: Understanding of nutrition guidelines, daily intake of sodium 1500mg , cholesterol 200mg , calories 30% from fat and 7% or less from saturated fats, daily to have 5 or more servings of fruits and vegetables.  Education: All About Nutrition: -Group instruction provided by verbal, written material, interactive activities, discussions, models, and posters to present general guidelines for heart healthy nutrition including fat, fiber, MyPlate, the role of sodium in heart healthy nutrition, utilization of the nutrition label, and utilization of this knowledge for meal planning. Follow up email sent as well. Written material given at graduation.   Biometrics:  Pre  Biometrics - 08/26/23 0911       Pre Biometrics   Height 5\' 10"  (1.778 m)    Weight 213 lb 11.2 oz (96.9 kg)    Waist Circumference 44 inches    Hip Circumference 42 inches    Waist to Hip Ratio 1.05 %    BMI (Calculated) 30.66    Single Leg Stand 8.9 seconds              Nutrition Therapy Plan and Nutrition Goals:  Nutrition Therapy & Goals - 08/26/23 1106       Nutrition Therapy   RD appointment deferred Yes      Intervention Plan   Intervention Prescribe, educate and counsel regarding individualized specific dietary modifications aiming towards targeted core components such as weight, hypertension, lipid management, diabetes, heart failure and other comorbidities.    Expected Outcomes Short Term Goal: Understand basic principles of dietary content, such as calories, fat, sodium, cholesterol and nutrients.;Short Term Goal:  A plan has been developed with personal nutrition goals set during dietitian appointment.;Long Term Goal: Adherence to prescribed nutrition plan.             Nutrition Assessments:  MEDIFICTS Score Key: >=70 Need to make dietary changes  40-70 Heart Healthy Diet <= 40 Therapeutic Level Cholesterol Diet  Flowsheet Row Pulmonary Rehab from 08/26/2023 in [facility] Cardiac and Pulmonary Rehab  Picture Your Plate Total Score on Admission 52      Picture Your Plate Scores: <16 Unhealthy dietary pattern with much room for improvement. 41-50 Dietary pattern unlikely to meet recommendations for good health and room for improvement. 51-60 More healthful dietary pattern, with some room for improvement.  >60 Healthy dietary pattern, although there may be some specific behaviors that could be improved.   Nutrition Goals Re-Evaluation:  Nutrition Goals Re-Evaluation     Row Name 09/06/23 1720 10/04/23 1743           Goals   Nutrition Goal -- Patient continues to defer RD apt. He already meets with an RD at the [place].      Comment Patient was informed on  why it is important to maintain a balanced diet when dealing with Respiratory issues. Explained that it takes a lot of energy to breath and when they are short of breath often they will need to have a good diet to help keep up with the calories they are expending for breathing. --      Expected Outcome Short: Choose and plan snacks accordingly to patients caloric intake to improve breathing. Long: Maintain a diet independently that meets their caloric intake to aid in daily shortness of breath. --               Nutrition Goals Discharge (Final Nutrition Goals Re-Evaluation):  Nutrition Goals Re-Evaluation - 10/04/23 1743       Goals   Nutrition Goal Patient continues to defer RD apt. He already meets with an RD at the [place].             Psychosocial: Target Goals: Acknowledge presence or absence of significant depression and/or stress, maximize coping skills, provide positive support system. Participant is able to verbalize types and ability to use techniques and skills needed for reducing stress and depression.   Education: Stress, Anxiety, and Depression - Group verbal and visual presentation to define topics covered.  Reviews how body is impacted by stress, anxiety, and depression.  Also discusses healthy ways to reduce stress and to treat/manage anxiety and depression.  Written material given at graduation.   Education: Sleep Hygiene -Provides group verbal and written instruction about how sleep can affect your health.  Define sleep hygiene, discuss sleep cycles and impact of sleep habits. Review good sleep hygiene tips.    Initial Review & Psychosocial Screening:  Initial Psych Review & Screening - 08/25/23 1148       Initial Review   Current issues with Current Stress Concerns;Current Depression;Current Sleep Concerns    Source of Stress Concerns Chronic Illness      Family Dynamics   Good Support System? Yes   local family and friends     Barriers   Psychosocial  barriers to participate in program There are no identifiable barriers or psychosocial needs.;The patient should benefit from training in stress management and relaxation.      Screening Interventions   Interventions Provide feedback about the scores to participant;To provide support and resources with identified psychosocial needs;Encouraged to exercise  Expected Outcomes Short Term goal: Utilizing psychosocial counselor, staff and physician to assist with identification of specific Stressors or current issues interfering with healing process. Setting desired goal for each stressor or current issue identified.;Long Term Goal: Stressors or current issues are controlled or eliminated.;Short Term goal: Identification and review with participant of any Quality of Life or Depression concerns found by scoring the questionnaire.;Long Term goal: The participant improves quality of Life and PHQ9 Scores as seen by post scores and/or verbalization of changes             Quality of Life Scores:  Scores of 19 and below usually indicate a poorer quality of life in these areas.  A difference of  2-3 points is a clinically meaningful difference.  A difference of 2-3 points in the total score of the Quality of Life Index has been associated with significant improvement in overall quality of life, self-image, physical symptoms, and general health in studies assessing change in quality of life.  PHQ-9: Review Flowsheet  More data exists      10/04/2023 08/26/2023 07/01/2021 06/03/2021 04/29/2021  Depression screen PHQ 2/9  Decreased Interest 0 2 1 2 2   Down, Depressed, Hopeless 1 2 0 1 2  PHQ - 2 Score 1 4 1 3 4   Altered sleeping 3 3 3 3 3   Tired, decreased energy 2 3 3 1 2   Change in appetite 1 2 1 2 2   Feeling bad or failure about yourself  0 1 0 0 0  Trouble concentrating 1 2 1 2 2   Moving slowly or fidgety/restless 0 0 0 0 0  Suicidal thoughts 0 2 0 0 0  PHQ-9 Score 8 17 9 11 13   Difficult doing  work/chores Somewhat difficult Somewhat difficult Somewhat difficult Somewhat difficult Not difficult at all   Interpretation of Total Score  Total Score Depression Severity:  1-4 = Minimal depression, 5-9 = Mild depression, 10-14 = Moderate depression, 15-19 = Moderately severe depression, 20-27 = Severe depression   Psychosocial Evaluation and Intervention:  Psychosocial Evaluation - 08/25/23 1150       Psychosocial Evaluation & Interventions   Interventions Encouraged to exercise with the program and follow exercise prescription;Stress management education;Relaxation education    Comments Gilles is returning to pulmonary rehab. He did the program a couple years ago and really enjoyed it, so he is looking forward to attending again. His breathing has gotten worse recently, which is altering his difficult sleep patterns even more. He has had sleep concerns for years and notes he is waking more frequently. He states he recently had a sleep study and was told he needs a cpap, but nothing has been given to him. He sees the [place] next week and plans to ask about a cpap. He has a history of PTSD which he states he feels is being managed throught [place], support from his family and friends, and setting personal goals. He admits to restarting smoking after 18 months of abstaining and thinks it was his depression that led him to restarting. However, in November he made the decision to quit again and really wants to stick to staying away from smoking. He wants to come to the program to work on his stamina, balance, and breathing    Expected Outcomes Short: attend pulmonary rehab for education and exercise. Long: develop and maintain positive self care habits.    Continue Psychosocial Services  Follow up required by staff  Psychosocial Re-Evaluation:  Psychosocial Re-Evaluation     Row Name 10/04/23 1751 10/04/23 1752           Psychosocial Re-Evaluation   Current issues with Current  Depression;Current Sleep Concerns;History of Depression;Current Stress Concerns;Current Psychotropic Meds Current Depression;Current Sleep Concerns;History of Depression;Current Stress Concerns;Current Psychotropic Meds      Comments -- Reyden's PHQ9 was reevaluated and his score went down from 17 to 8. He states he is on treatment for depression and it is managed.      Expected Outcomes -- Short: continue to monitor mental health and notify doctor is there are any changes. Long: maintain good mental health habits.      Interventions -- Encouraged to attend Pulmonary Rehabilitation for the exercise      Continue Psychosocial Services  -- Follow up required by staff               Psychosocial Discharge (Final Psychosocial Re-Evaluation):  Psychosocial Re-Evaluation - 10/04/23 1752       Psychosocial Re-Evaluation   Current issues with Current Depression;Current Sleep Concerns;History of Depression;Current Stress Concerns;Current Psychotropic Meds    Comments Rajinder's PHQ9 was reevaluated and his score went down from 17 to 8. He states he is on treatment for depression and it is managed.    Expected Outcomes Short: continue to monitor mental health and notify doctor is there are any changes. Long: maintain good mental health habits.    Interventions Encouraged to attend Pulmonary Rehabilitation for the exercise    Continue Psychosocial Services  Follow up required by staff             Education: Education Goals: Education classes will be provided on a weekly basis, covering required topics. Participant will state understanding/return demonstration of topics presented.  Learning Barriers/Preferences:  Learning Barriers/Preferences - 08/25/23 1148       Learning Barriers/Preferences   Learning Barriers None    Learning Preferences None             General Pulmonary Education Topics:  Infection Prevention: - Provides verbal and written material to individual with  discussion of infection control including proper hand washing and proper equipment cleaning during exercise session. Flowsheet Row Pulmonary Rehab from 08/26/2023 in [facility] Cardiac and Pulmonary Rehab  Date 08/26/23  Educator NT  Instruction Review Code 1- Verbalizes Understanding       Falls Prevention: - Provides verbal and written material to individual with discussion of falls prevention and safety. Flowsheet Row Pulmonary Rehab from 08/26/2023 in [facility] Cardiac and Pulmonary Rehab  Date 08/26/23  Educator NT  Instruction Review Code 1- Verbalizes Understanding       Chronic Lung Disease Review: - Group verbal instruction with posters, models, PowerPoint presentations and videos,  to review new updates, new respiratory medications, new advancements in procedures and treatments. Providing information on websites and "800" numbers for continued self-education. Includes information about supplement oxygen, available portable oxygen systems, continuous and intermittent flow rates, oxygen safety, concentrators, and Medicare reimbursement for oxygen. Explanation of Pulmonary Drugs, including class, frequency, complications, importance of spacers, rinsing mouth after steroid MDI's, and proper cleaning methods for nebulizers. Review of basic lung anatomy and physiology related to function, structure, and complications of lung disease. Review of risk factors. Discussion about methods for diagnosing sleep apnea and types of masks and machines for OSA. Includes a review of the use of types of environmental controls: home humidity, furnaces, filters, dust mite/pet prevention, HEPA vacuums. Discussion about weather changes, air quality and  the benefits of nasal washing. Instruction on Warning signs, infection symptoms, calling MD promptly, preventive modes, and value of vaccinations. Review of effective airway clearance, coughing and/or vibration techniques. Emphasizing that all should Create an Action Plan.  Written material given at graduation. Flowsheet Row Pulmonary Rehab from 08/26/2023 in [facility] Cardiac and Pulmonary Rehab  Education need identified 08/26/23       AED/CPR: - Group verbal and written instruction with the use of models to demonstrate the basic use of the AED with the basic ABC's of resuscitation.    Anatomy and Cardiac Procedures: - Group verbal and visual presentation and models provide information about basic cardiac anatomy and function. Reviews the testing methods done to diagnose heart disease and the outcomes of the test results. Describes the treatment choices: Medical Management, Angioplasty, or Coronary Bypass Surgery for treating various heart conditions including Myocardial Infarction, Angina, Valve Disease, and Cardiac Arrhythmias.  Written material given at graduation. Flowsheet Row Pulmonary Rehab from 04/17/2021 in [facility] Cardiac and Pulmonary Rehab  Date 04/17/21  Educator KB  Instruction Review Code 1- Verbalizes Understanding       Medication Safety: - Group verbal and visual instruction to review commonly prescribed medications for heart and lung disease. Reviews the medication, class of the drug, and side effects. Includes the steps to properly store meds and maintain the prescription regimen.  Written material given at graduation. Flowsheet Row Pulmonary Rehab from 08/26/2023 in [facility] Cardiac and Pulmonary Rehab  Education need identified 08/26/23       Other: -Provides group and verbal instruction on various topics (see comments)   Knowledge Questionnaire Score:  Knowledge Questionnaire Score - 08/26/23 1104       Knowledge Questionnaire Score   Pre Score 15/18              Core Components/Risk Factors/Patient Goals at Admission:  Personal Goals and Risk Factors at Admission - 08/25/23 1138       Core Components/Risk Factors/Patient Goals on Admission   Tobacco Cessation Yes   Quit November 2024   Intervention Offer Tour manager, assist with locating and accessing local/national Quit Smoking programs, and support quit date choice.;Assist the participant in steps to quit. Provide individualized education and counseling about committing to Tobacco Cessation, relapse prevention, and pharmacological support that can be provided by physician.    Expected Outcomes Short Term: Will quit all tobacco product use, adhering to prevention of relapse plan.;Long Term: Complete abstinence from all tobacco products for at least 12 months from quit date.    Improve shortness of breath with ADL's Yes    Intervention Provide education, individualized exercise plan and daily activity instruction to help decrease symptoms of SOB with activities of daily living.    Expected Outcomes Short Term: Improve cardiorespiratory fitness to achieve a reduction of symptoms when performing ADLs;Long Term: Be able to perform more ADLs without symptoms or delay the onset of symptoms    Diabetes Yes    Intervention Provide education about signs/symptoms and action to take for hypo/hyperglycemia.;Provide education about proper nutrition, including hydration, and aerobic/resistive exercise prescription along with prescribed medications to achieve blood glucose in normal ranges: Fasting glucose 65-99 mg/dL    Expected Outcomes Short Term: Participant verbalizes understanding of the signs/symptoms and immediate care of hyper/hypoglycemia, proper foot care and importance of medication, aerobic/resistive exercise and nutrition plan for blood glucose control.;Long Term: Attainment of HbA1C < 7%.    Hypertension Yes    Intervention Provide education on lifestyle modifcations including  regular physical activity/exercise, weight management, moderate sodium restriction and increased consumption of fresh fruit, vegetables, and low fat dairy, alcohol moderation, and smoking cessation.;Monitor prescription use compliance.    Expected Outcomes Short Term: Continued  assessment and intervention until BP is < 140/45mm HG in hypertensive participants. < 130/25mm HG in hypertensive participants with diabetes, heart failure or chronic kidney disease.;Long Term: Maintenance of blood pressure at goal levels.    Lipids Yes    Intervention Provide education and support for participant on nutrition & aerobic/resistive exercise along with prescribed medications to achieve LDL 70mg , HDL >40mg .    Expected Outcomes Short Term: Participant states understanding of desired cholesterol values and is compliant with medications prescribed. Participant is following exercise prescription and nutrition guidelines.;Long Term: Cholesterol controlled with medications as prescribed, with individualized exercise RX and with personalized nutrition plan. Value goals: LDL < 70mg , HDL > 40 mg.             Education:Diabetes - Individual verbal and written instruction to review signs/symptoms of diabetes, desired ranges of glucose level fasting, after meals and with exercise. Acknowledge that pre and post exercise glucose checks will be done for 3 sessions at entry of program. Flowsheet Row Pulmonary Rehab from 08/26/2023 in [facility] Cardiac and Pulmonary Rehab  Date 08/25/23  Educator [facility]  Instruction Review Code 1- Verbalizes Understanding       Know Your Numbers and Heart Failure: - Group verbal and visual instruction to discuss disease risk factors for cardiac and pulmonary disease and treatment options.  Reviews associated critical values for Overweight/Obesity, Hypertension, Cholesterol, and Diabetes.  Discusses basics of heart failure: signs/symptoms and treatments.  Introduces Heart Failure Zone chart for action plan for heart failure.  Written material given at graduation.   Core Components/Risk Factors/Patient Goals Review:   Goals and Risk Factor Review     Row Name 09/06/23 1720 10/04/23 1748           Core Components/Risk Factors/Patient Goals Review   Personal Goals  Review Improve shortness of breath with ADL's Hypertension;Diabetes;Improve shortness of breath with ADL's      Review Spoke to patient about their shortness of breath and what they can do to improve. Patient has been informed of breathing techniques when starting the program. Patient is informed to tell staff if they have had any med changes and that certain meds they are taking or not taking can be causing shortness of breath. Patient reports he is not dealing with much SOB anymore and is doing well with that. He does occationally check his BP at home and does own a cuff. He was encouraged to become more routine in monitoring is BP at home. He stated he has all the supplies he needs to check his blood sugar but needs to get in a better habit of monitoring it more regularly. He plans to start checking it once a day.      Expected Outcomes Short: Attend [facility] regularly to improve shortness of breath with ADL's. Long: maintain independence with ADL's Short: start checking blood sugar every day. Long: independently monitor BP and blood sugar regularly and consistently.               Core Components/Risk Factors/Patient Goals at Discharge (Final Review):   Goals and Risk Factor Review - 10/04/23 1748       Core Components/Risk Factors/Patient Goals Review   Personal Goals Review Hypertension;Diabetes;Improve shortness of breath with ADL's    Review Patient reports he is not dealing  with much SOB anymore and is doing well with that. He does occationally check his BP at home and does own a cuff. He was encouraged to become more routine in monitoring is BP at home. He stated he has all the supplies he needs to check his blood sugar but needs to get in a better habit of monitoring it more regularly. He plans to start checking it once a day.    Expected Outcomes Short: start checking blood sugar every day. Long: independently monitor BP and blood sugar regularly and consistently.             ITP  Comments:  ITP Comments     Row Name 08/25/23 1144 08/26/23 0908 08/30/23 1728 09/01/23 1316 09/29/23 0838   ITP Comments Initial phone call completed. Diagnosis can be found in [facility] 11/20. EP Orientation scheduled for Thursday 1/9 at 8am. Completed and gym orientation. Initial ITP created and sent for review to Dr. Jinny Sanders, Medical Director. First full day of exercise!  Patient was oriented to gym and equipment including functions, settings, policies, and procedures.  Patient's individual exercise prescription and treatment plan were reviewed.  All starting workloads were established based on the results of the 6 minute walk test done at initial orientation visit.  The plan for exercise progression was also introduced and progression will be customized based on patient's performance and goals. 30 Day review completed. Medical Director ITP review done, changes made as directed, and signed approval by Medical Director. 30 Day review completed. Medical Director ITP review done, changes made as directed, and signed approval by Medical Director.    Row Name 10/20/23 1623 10/27/23 0847         ITP Comments Patient states he has a bulging disc and has had an MRI. His doctor suggested he waits to resume rehab until he has talked to a Careers adviser. Informed patient that he will be put on a medical hold and to call back when he is able to speak with the surgeon. 30 Day review completed. Medical Director ITP review done, changes made as directed, and signed approval by Medical Director.               Comments: 30 Day review completed. Medical Director ITP review done, changes made as directed, and signed approval by Medical Director.

## 2023-11-03 NOTE — Progress Notes (Unsigned)
 Referring Physician:  Pearlie Oyster, MD [address] [place],  [place] [address]  Primary Physician:  Anson Fret, MD  History of Present Illness: 11/03/2023 [name] is here today with a chief complaint of ***  Back and leg pain/numbness  Duration: *** Location: *** Quality: *** Severity: ***  Precipitating: aggravated by *** Modifying factors: made better by *** Weakness: none Timing: *** Bowel/Bladder Dysfunction: none  Conservative measures:  Physical therapy: *** has not participated in? Multimodal medical therapy including regular antiinflammatories: *** gabapentin, tylenol, prednisone, medrol dosepak Injections: *** no epidural steroid injections?  Past Surgery: ***no spinal surgeries?  TAJAE RYBICKI has ***no symptoms of cervical myelopathy.  The symptoms are causing a significant impact on the patient's life.   I have utilized the care everywhere function in epic to review the outside records available from external health systems.  Review of Systems:  A 10 point review of systems is negative, except for the pertinent positives and negatives detailed in the HPI.  Past Medical History: Past Medical History:  Diagnosis Date   Chronic kidney disease    COPD (chronic obstructive pulmonary disease) (HCC)    Coronary artery disease    DDD (degenerative disc disease)    Diabetes mellitus without complication (HCC)    Hyperlipidemia    Hypertension     Past Surgical History: No past surgical history on file.  Allergies: Allergies as of 11/04/2023 - Review Complete 08/25/2023  Allergen Reaction Noted   Sitagliptin Other (See Comments) 07/01/2023    Medications:  Current Outpatient Medications:    albuterol (PROVENTIL HFA;VENTOLIN HFA) 108 (90 Base) MCG/ACT inhaler, Inhale 1 puff into the lungs every 6 (six) hours as needed for wheezing or shortness of breath., Disp: , Rfl:    albuterol (VENTOLIN HFA) 108 (90 Base) MCG/ACT  inhaler, Inhale into the lungs., Disp: , Rfl:    Alogliptin Benzoate 25 MG TABS, Take by mouth., Disp: , Rfl:    Ascorbic Acid (VITAMIN C) 100 MG tablet, Take 100 mg by mouth daily., Disp: , Rfl:    aspirin EC 81 MG tablet, Take 81 mg by mouth daily.  , Disp: , Rfl:    atorvastatin (LIPITOR) 40 MG tablet, Take 40 mg by mouth daily., Disp: , Rfl:    azelastine (ASTELIN) 0.1 % nasal spray, Place 2 sprays into both nostrils 2 (two) times daily as needed for rhinitis. Use in each nostril as directed, Disp: 30 mL, Rfl: 5   budesonide-formoterol (SYMBICORT) 160-4.5 MCG/ACT inhaler, Inhale into the lungs. (Patient not taking: Reported on 08/25/2023), Disp: , Rfl:    cholecalciferol (VITAMIN D) 1000 UNITS tablet, Take 2,000 Units by mouth daily., Disp: , Rfl:    fluticasone (FLONASE) 50 MCG/ACT nasal spray, Place 2 sprays into both nostrils daily. (Patient not taking: Reported on 08/25/2023), Disp: 16 g, Rfl: 5   folic acid (FOLVITE) 1 MG tablet, Take 1 mg by mouth daily., Disp: , Rfl:    gabapentin (NEURONTIN) 300 MG capsule, Take 300 mg by mouth.  TAKE ONE CAPSULE BY MOUTH EVERY MORNING AND TAKE ONE CAPSULE AT NOON AND TAKE TWO CAPSULES EVERY EVENING FOR NERVE PAIN/BURNING, Disp: , Rfl:    hydrochlorothiazide (HYDRODIURIL) 25 MG tablet, Take 1 tablet by mouth every morning., Disp: , Rfl:    losartan (COZAAR) 100 MG tablet, Take 100 mg by mouth daily., Disp: , Rfl:    Magnesium Oxide 420 MG TABS, Take 420 mg by mouth daily., Disp: , Rfl:  Meclizine HCl 25 MG CHEW, Chew by mouth.  CHEW 1-2 TABLETS BY MOUTH 1 HOUR BEFORE BEDTIME FOR DIZZINESS AND SLEEP; NO DRIVING FOR 6 HOURS AFTER TAKING, Disp: , Rfl:    metFORMIN (GLUCOPHAGE-XR) 750 MG 24 hr tablet, Take 750 mg by mouth daily with breakfast., Disp: , Rfl:    mometasone (ASMANEX) 220 MCG/INH inhaler, Take by mouth., Disp: , Rfl:    Olodaterol HCl 2.5 MCG/ACT AERS, Take by mouth., Disp: , Rfl:    omeprazole (PRILOSEC OTC) 20 MG tablet, Take 20 mg by mouth  daily. (Patient not taking: Reported on 08/25/2023), Disp: , Rfl:    predniSONE (DELTASONE) 10 MG tablet, 6-day taper; 6 pills day one, 5 pills day 2, 4 pills day 3, 3 pills on day 4, 2 pills on day 5, 1 pill on day 6. Follow package instructions., Disp: , Rfl:    rOPINIRole (REQUIP) 0.5 MG tablet, Take by mouth., Disp: , Rfl:    rosuvastatin (CRESTOR) 20 MG tablet, Take 1 tablet (20 mg total) by mouth daily., Disp: 90 tablet, Rfl: 3   tamsulosin (FLOMAX) 0.4 MG CAPS capsule, Take 1 capsule by mouth daily., Disp: , Rfl:    traMADol (ULTRAM) 50 MG tablet, Take 1 tablet (50 mg total) by mouth every 6 (six) hours as needed., Disp: 10 tablet, Rfl: 0   vardenafil (LEVITRA) 20 MG tablet, Take 10 mg by mouth., Disp: , Rfl:   Social History: Social History   Tobacco Use   Smoking status: Former    Average packs/day: 1 pack/day for 25.0 years (25.0 ttl pk-yrs)    Types: Cigarettes    Start date: 07/12/1995    Quit date: 07/11/2020    Years since quitting: 3.3    Passive exposure: Current   Smokeless tobacco: Never   Tobacco comments:    02/03/21 currently at 1 ppd, [facility] and [facility] is considering it  Vaping Use   Vaping status: Never Used  Substance Use Topics   Alcohol use: Yes    Alcohol/week: 2.0 - 3.0 standard drinks of alcohol    Types: 2 - 3 Shots of liquor per week   Drug use: No    Family Medical History: Family History  Problem Relation Age of Onset   Heart disease Mother    Coronary artery disease Mother        CABG   Diabetes type II Mother    Cancer Father        "brain tumors"   Coronary artery disease Sister     Physical Examination: There were no vitals filed for this visit.  General: Patient is in no apparent distress. Attention to examination is appropriate.  Neck:   Supple.  Full range of motion.  Respiratory: Patient is breathing without any difficulty.   NEUROLOGICAL:     Awake, alert, oriented to person, place, and time.  Speech is clear and  fluent.   Cranial Nerves: Pupils equal round and reactive to light.  Facial tone is symmetric.  Facial sensation is symmetric. Shoulder shrug is symmetric. Tongue protrusion is midline.  There is no pronator drift.  Strength: Side Biceps Triceps Deltoid Interossei Grip Wrist Ext. Wrist Flex.  R 5 5 5 5 5 5 5   L 5 5 5 5 5 5 5    Side Iliopsoas Quads Hamstring PF DF EHL  R 5 5 5 5 5 5   L 5 5 5 5 5 5    Reflexes are ***2+ and symmetric at  the biceps, triceps, brachioradialis, patella and achilles.   Hoffman's is absent.   Bilateral upper and lower extremity sensation is intact to light touch.    No evidence of dysmetria noted.  Gait is normal.     Medical Decision Making  Imaging: ***  I have personally reviewed the images and agree with the above interpretation.  Assessment and Plan: Mr. Sheeley is a pleasant 78 y.o. male with ***    Thank you for involving me in the care of this patient.      Alaena Strader K. Myer Haff MD, [facility]

## 2023-11-04 ENCOUNTER — Ambulatory Visit (INDEPENDENT_AMBULATORY_CARE_PROVIDER_SITE_OTHER): Admitting: Neurosurgery

## 2023-11-04 ENCOUNTER — Encounter: Payer: Self-pay | Admitting: Neurosurgery

## 2023-11-04 VITALS — BP 118/78 | Ht 70.0 in | Wt 210.0 lb

## 2023-11-04 DIAGNOSIS — M48062 Spinal stenosis, lumbar region with neurogenic claudication: Secondary | ICD-10-CM | POA: Diagnosis not present

## 2023-11-05 NOTE — Addendum Note (Signed)
 Addended by: Ernie Hew on: 11/05/2023 11:33 AM   Modules accepted: Orders

## 2023-11-09 ENCOUNTER — Telehealth: Payer: Self-pay

## 2023-11-09 NOTE — Telephone Encounter (Signed)
 We called pt today to check in on him as he has been out on medical hold since 10/13/2023 due to back pain. His doctor has referred him to physical therapy for his back. He would like to be kept on medical hold at this time and will update [place] on his return to the program once he finishes PT.

## 2023-11-24 ENCOUNTER — Encounter: Payer: Self-pay | Admitting: *Deleted

## 2023-11-24 DIAGNOSIS — J449 Chronic obstructive pulmonary disease, unspecified: Secondary | ICD-10-CM

## 2023-11-24 NOTE — Progress Notes (Signed)
 Pulmonary Individual Treatment Plan  Patient Details  Name: Robert Combs MRN: [id number] Date of Birth: 1946-07-10 Referring Provider:   Flowsheet Row Pulmonary Rehab from 08/26/2023 in [facility] Cardiac and Pulmonary Rehab  Referring Provider Dr. Carlisle Cater       Initial Encounter Date:  Flowsheet Row Pulmonary Rehab from 08/26/2023 in [facility] Cardiac and Pulmonary Rehab  Date 08/26/23       Visit Diagnosis: Chronic obstructive pulmonary disease, unspecified COPD type (HCC)  Patient's Home Medications on Admission:  Current Outpatient Medications:    albuterol (PROVENTIL HFA;VENTOLIN HFA) 108 (90 Base) MCG/ACT inhaler, Inhale 1 puff into the lungs every 6 (six) hours as needed for wheezing or shortness of breath., Disp: , Rfl:    albuterol (VENTOLIN HFA) 108 (90 Base) MCG/ACT inhaler, Inhale into the lungs., Disp: , Rfl:    Alogliptin Benzoate 25 MG TABS, Take by mouth., Disp: , Rfl:    aspirin EC 81 MG tablet, Take 81 mg by mouth daily.  , Disp: , Rfl:    atorvastatin (LIPITOR) 40 MG tablet, Take 40 mg by mouth daily., Disp: , Rfl:    azelastine (ASTELIN) 0.1 % nasal spray, Place 2 sprays into both nostrils 2 (two) times daily as needed for rhinitis. Use in each nostril as directed, Disp: 30 mL, Rfl: 5   cholecalciferol (VITAMIN D) 1000 UNITS tablet, Take 2,000 Units by mouth daily., Disp: , Rfl:    fluticasone (FLONASE) 50 MCG/ACT nasal spray, Place 2 sprays into both nostrils daily., Disp: 16 g, Rfl: 5   folic acid (FOLVITE) 1 MG tablet, Take 1 mg by mouth daily., Disp: , Rfl:    gabapentin (NEURONTIN) 300 MG capsule, Take 300 mg by mouth.  TAKE ONE CAPSULE BY MOUTH EVERY MORNING AND TAKE ONE CAPSULE AT NOON AND TAKE TWO CAPSULES EVERY EVENING FOR NERVE PAIN/BURNING, Disp: , Rfl:    hydrochlorothiazide (HYDRODIURIL) 25 MG tablet, Take 1 tablet by mouth every morning., Disp: , Rfl:    losartan (COZAAR) 100 MG tablet, Take 100 mg by mouth daily., Disp: , Rfl:    Magnesium Oxide 420 MG  TABS, Take 420 mg by mouth daily., Disp: , Rfl:    vardenafil (LEVITRA) 20 MG tablet, Take 10 mg by mouth., Disp: , Rfl:   Past Medical History: Past Medical History:  Diagnosis Date   Chronic kidney disease    COPD (chronic obstructive pulmonary disease) (HCC)    Coronary artery disease    DDD (degenerative disc disease)    Diabetes mellitus without complication (HCC)    Hyperlipidemia    Hypertension     Tobacco Use: Social History   Tobacco Use  Smoking Status Every Day   Types: Cigarettes   Passive exposure: Current  Smokeless Tobacco Never  Tobacco Comments   Started back in January 2025    Labs: Review Flowsheet       Latest Ref Rng & Units 02/03/2010 11/04/2015  Labs for [facility] Cardiac and Pulmonary Rehab  Bicarbonate 20.0 - 24.0 mEq/L - 26.3   TCO2 0 - 100 mmol/L 31  28   O2 Saturation % - 75.0      Pulmonary Assessment Scores:  Pulmonary Assessment Scores     Row Name 08/26/23 1105         ADL UCSD   ADL Phase Entry     SOB Score total 55     Rest 0     Walk 0     Stairs 5  Bath 0     Dress 0     Shop 0       CAT Score   CAT Score 26       mMRC Score   mMRC Score 1              UCSD: Self-administered rating of dyspnea associated with activities of daily living (ADLs) 6-point scale (0 = "Combs at all" to 5 = "maximal or unable to do because of breathlessness")  Scoring Scores range from 0 to 120.  Minimally important difference is 5 units  CAT: CAT can identify the health impairment of COPD patients and is better correlated with disease progression.  CAT has a scoring range of zero to 40. The CAT score is classified into four groups of low (less than 10), medium (10 - 20), high (21-30) and very high (31-40) based on the impact level of disease on health status. A CAT score over 10 suggests significant symptoms.  A worsening CAT score could be explained by an exacerbation, poor medication adherence, poor inhaler technique, or progression  of COPD or comorbid conditions.  CAT MCID is 2 points  mMRC: mMRC (Modified Medical Research Council) Dyspnea Scale is used to assess the degree of baseline functional disability in patients of respiratory disease due to dyspnea. No minimal important difference is established. A decrease in score of 1 point or greater is considered a positive change.   Pulmonary Function Assessment:   Exercise Target Goals: Exercise Program Goal: Individual exercise prescription set using results from initial 6 min walk test and THRR while considering  patient's activity barriers and safety.   Exercise Prescription Goal: Initial exercise prescription builds to 30-45 minutes a day of aerobic activity, 2-3 days per week.  Home exercise guidelines will be given to patient during program as part of exercise prescription that the participant will acknowledge.  Education: Aerobic Exercise: - Group verbal and visual presentation on the components of exercise prescription. Introduces F.I.T.T principle from ACSM for exercise prescriptions.  Reviews F.I.T.T. principles of aerobic exercise including progression. Written material given at graduation. Flowsheet Row Pulmonary Rehab from 04/17/2021 in [facility] Cardiac and Pulmonary Rehab  Date 02/06/21  Educator [facility]  Instruction Review Code 1- Verbalizes Understanding       Education: Resistance Exercise: - Group verbal and visual presentation on the components of exercise prescription. Introduces F.I.T.T principle from ACSM for exercise prescriptions  Reviews F.I.T.T. principles of resistance exercise including progression. Written material given at graduation. Flowsheet Row Pulmonary Rehab from 04/17/2021 in [facility] Cardiac and Pulmonary Rehab  Date 04/17/21  Educator [facility]  Instruction Review Code 1- Verbalizes Understanding        Education: Exercise & Equipment Safety: - Individual verbal instruction and demonstration of equipment use and safety with use of the  equipment. Flowsheet Row Pulmonary Rehab from 08/26/2023 in [facility] Cardiac and Pulmonary Rehab  Date 08/26/23  Educator [facility]  Instruction Review Code 1- Verbalizes Understanding       Education: Exercise Physiology & General Exercise Guidelines: - Group verbal and written instruction with models to review the exercise physiology of the cardiovascular system and associated critical values. Provides general exercise guidelines with specific guidelines to those with heart or lung disease.    Education: Flexibility, Balance, Mind/Body Relaxation: - Group verbal and visual presentation with interactive activity on the components of exercise prescription. Introduces F.I.T.T principle from ACSM for exercise prescriptions. Reviews F.I.T.T. principles of flexibility and balance exercise training including progression. Also discusses the  mind body connection.  Reviews various relaxation techniques to help reduce and manage stress (i.e. Deep breathing, progressive muscle relaxation, and visualization). Balance handout provided to take home. Written material given at graduation. Flowsheet Row Pulmonary Rehab from 04/17/2021 in [facility] Cardiac and Pulmonary Rehab  Date 02/20/21  Educator [facility]  Instruction Review Code 1- Verbalizes Understanding       Activity Barriers & Risk Stratification:  Activity Barriers & Cardiac Risk Stratification - 08/25/23 1135       Activity Barriers & Cardiac Risk Stratification   Activity Barriers Shortness of Breath;Deconditioning;Muscular Weakness;Balance Concerns   neuropathy (right leg worse)            6 Minute Walk:  6 Minute Walk     Row Name 08/26/23 0908         6 Minute Walk   Phase Initial     Distance 1340 feet     Walk Time 6 minutes     # of Rest Breaks 0     MPH 2.54     METS 2.49     RPE 13     Perceived Dyspnea  3     VO2 Peak 8.71     Symptoms Yes (comment)     Comments leg weakness     Resting HR 58 bpm     Resting BP 124/62     Resting  Oxygen Saturation  94 %     Exercise Oxygen Saturation  during 6 min walk 88 %     Max Ex. HR 91 bpm     Max Ex. BP 136/64     2 Minute Post BP 126/58       Interval HR   1 Minute HR 63     2 Minute HR 73     3 Minute HR 70     4 Minute HR 78     5 Minute HR 81     6 Minute HR 91     2 Minute Post HR 63     Interval Heart Rate? Yes       Interval Oxygen   Interval Oxygen? Yes     Baseline Oxygen Saturation % 94 %     1 Minute Oxygen Saturation % 92 %     1 Minute Liters of Oxygen 0 L  RA     2 Minute Oxygen Saturation % 90 %     2 Minute Liters of Oxygen 0 L     3 Minute Oxygen Saturation % 88 %     3 Minute Liters of Oxygen 0 L     4 Minute Oxygen Saturation % 90 %     4 Minute Liters of Oxygen 0 L     5 Minute Oxygen Saturation % 89 %     5 Minute Liters of Oxygen 0 L     6 Minute Oxygen Saturation % 88 %     6 Minute Liters of Oxygen 0 L     2 Minute Post Oxygen Saturation % 96 %     2 Minute Post Liters of Oxygen 0 L             Oxygen Initial Assessment:  Oxygen Initial Assessment - 08/25/23 1148       Home Oxygen   Home Oxygen Device None    Sleep Oxygen Prescription None    Home Exercise Oxygen Prescription None    Home Resting Oxygen Prescription None    Compliance with  Home Oxygen Use Yes      Intervention   Short Term Goals To learn and understand importance of monitoring SPO2 with pulse oximeter and demonstrate accurate use of the pulse oximeter.;To learn and understand importance of maintaining oxygen saturations>88%;To learn and demonstrate proper pursed lip breathing techniques or other breathing techniques. ;To learn and demonstrate proper use of respiratory medications    Long  Term Goals Verbalizes importance of monitoring SPO2 with pulse oximeter and return demonstration;Maintenance of O2 saturations>88%;Exhibits proper breathing techniques, such as pursed lip breathing or other method taught during program session;Compliance with respiratory  medication             Oxygen Re-Evaluation:  Oxygen Re-Evaluation     Row Name 08/30/23 1729 09/06/23 1712 10/04/23 1739         Program Oxygen Prescription   Program Oxygen Prescription -- None None       Home Oxygen   Home Oxygen Device -- None None     Sleep Oxygen Prescription -- None None     Home Exercise Oxygen Prescription -- None None     Home Resting Oxygen Prescription -- None None     Compliance with Home Oxygen Use -- -- Yes       Goals/Expected Outcomes   Short Term Goals -- To learn and demonstrate proper pursed lip breathing techniques or other breathing techniques.  To learn and demonstrate proper pursed lip breathing techniques or other breathing techniques. ;To learn and understand importance of maintaining oxygen saturations>88%;To learn and understand importance of monitoring SPO2 with pulse oximeter and demonstrate accurate use of the pulse oximeter.     Long  Term Goals -- Exhibits proper breathing techniques, such as pursed lip breathing or other method taught during program session Maintenance of O2 saturations>88%;Verbalizes importance of monitoring SPO2 with pulse oximeter and return demonstration;Exhibits proper breathing techniques, such as pursed lip breathing or other method taught during program session     Comments Reviewed PLB technique with pt.  Talked about how it works and it's importance in maintaining their exercise saturations. Informed patient how to perform the Pursed Lipped breathing technique. Told patient to Inhale through the nose and out the mouth with pursed lips to keep their airways open, help oxygenate them better, practice when at rest or doing strenuous activity. Patient Verbalizes understanding of technique and will work on and be reiterated during [facility]. Robert Combs Combs Combs currently own a pulse oxymeter. He was encouraged to get one to be able to monitor his heart rate and oxygen saturations at home, specifically during home  exercise. [facility]. He Combs state that he uses PLB techniques to help control SOB and improve SaO2 numbers.     Goals/Expected Outcomes Short: Become more profiecient at using PLB.   Long: Become independent at using PLB. Short: use PLB with exertion. Long: use PLB on exertion proficiently and independently. Short: talk to [place] about getting a pulse oxymeter. Long: monitor SaO2 and use PLB independently to control SOB              Oxygen Discharge (Final Oxygen Re-Evaluation):  Oxygen Re-Evaluation - 10/04/23 1739       Program Oxygen Prescription   Program Oxygen Prescription None      Home Oxygen   Home Oxygen Device None    Sleep Oxygen Prescription None    Home Exercise Oxygen Prescription None    Home Resting Oxygen  Prescription None    Compliance with Home Oxygen Use Yes      Goals/Expected Outcomes   Short Term Goals To learn and demonstrate proper pursed lip breathing techniques or other breathing techniques. ;To learn and understand importance of maintaining oxygen saturations>88%;To learn and understand importance of monitoring SPO2 with pulse oximeter and demonstrate accurate use of the pulse oximeter.    Long  Term Goals Maintenance of O2 saturations>88%;Verbalizes importance of monitoring SPO2 with pulse oximeter and return demonstration;Exhibits proper breathing techniques, such as pursed lip breathing or other method taught during program session    Comments Robert Combs currently own a pulse oxymeter. He was encouraged to get one to be able to monitor his heart rate and oxygen saturations at home, specifically during home exercise. [facility]. He Combs state that he uses PLB techniques to help control SOB and improve SaO2 numbers.    Goals/Expected Outcomes Short: talk to [place] about getting a pulse oxymeter. Long: monitor SaO2 and use PLB independently to control SOB              Initial Exercise Prescription:  Initial Exercise Prescription - 08/26/23 1100       Date of Initial Exercise RX and Referring Provider   Date 08/26/23    Referring Provider Dr. Carlisle Cater      Oxygen   Maintain Oxygen Saturation 88% or higher      Treadmill   MPH 2    Grade 0    Minutes 15    METs 2.53      NuStep   Level 2    SPM 80    Minutes 15    METs 2.49      REL-XR   Level 2    Speed 50    Minutes 15    METs 2.49      Prescription Details   Frequency (times per week) 3    Duration Progress to 30 minutes of continuous aerobic without signs/symptoms of physical distress      Intensity   THRR 40-80% of Max Heartrate 92-127    Ratings of Perceived Exertion 11-13    Perceived Dyspnea 0-4      Progression   Progression Continue to progress workloads to maintain intensity without signs/symptoms of physical distress.      Resistance Training   Training Prescription Yes    Weight 7 lb    Reps 10-15             Perform Capillary Blood Glucose checks as needed.  Exercise Prescription Changes:   Exercise Prescription Changes     Row Name 08/26/23 1100 09/16/23 1200 10/04/23 1700 10/14/23 1500 10/28/23 1100     Response to Exercise   Blood Pressure (Admit) 124/62 142/70 -- 148/87 122/60   Blood Pressure (Exercise) 136/64 156/82 -- 152/82 --   Blood Pressure (Exit) 126/58 138/70 -- 118/70 136/82   Heart Rate (Admit) 58 bpm 63 bpm -- 68 bpm 67 bpm   Heart Rate (Exercise) 91 bpm 103 bpm -- 103 bpm 100 bpm   Heart Rate (Exit) 63 bpm 70 bpm -- 74 bpm 78 bpm   Oxygen Saturation (Admit) 94 % 93 % -- 91 % 90 %   Oxygen Saturation (Exercise) 88 % 90 % -- 88 % 88 %   Oxygen Saturation (Exit) 96 % 94 % -- 91 % 92 %   Rating of Perceived Exertion (Exercise)  13 13 -- 13 13   Perceived Dyspnea (Exercise) 3 2 -- 1 1   Symptoms leg weakness none -- none none   Comments Results First 2 weeks of exercise -- -- --   Duration -- Progress to 30  minutes of  aerobic without signs/symptoms of physical distress Progress to 30 minutes of  aerobic without signs/symptoms of physical distress Continue with 30 min of aerobic exercise without signs/symptoms of physical distress. Continue with 30 min of aerobic exercise without signs/symptoms of physical distress.   Intensity -- THRR unchanged THRR unchanged THRR unchanged THRR unchanged     Progression   Progression -- Continue to progress workloads to maintain intensity without signs/symptoms of physical distress. Continue to progress workloads to maintain intensity without signs/symptoms of physical distress. Continue to progress workloads to maintain intensity without signs/symptoms of physical distress. Continue to progress workloads to maintain intensity without signs/symptoms of physical distress.   Average METs -- 2.76 2.76 2.67 2.53     Resistance Training   Training Prescription -- Yes Yes Yes Yes   Weight -- 7lb 7lb 7 lb 7 lb   Reps -- 10-15 10-15 10-15 10-15     Interval Training   Interval Training -- No No No No     Treadmill   MPH -- 3.5 3.5 2.5 1.7   Grade -- 0 0 0 0   Minutes -- 15 15 15 15    METs -- 3.68 3.68 2.91 2.3     NuStep   Level -- 5 5 5 5    Minutes -- 15 15 15 15    METs -- 2.5 2.5 2 2      REL-XR   Level -- 2 2 2 2    Minutes -- 15 15 15 15    METs -- 3 3 3.5 3.5     Home Exercise Plan   Plans to continue exercise at -- -- Home (comment)  Rec. Elliptical Home (comment)  Rec. Elliptical Home (comment)  Rec. Elliptical   Frequency -- -- Add 2 additional days to program exercise sessions. Add 2 additional days to program exercise sessions. Add 2 additional days to program exercise sessions.   Initial Home Exercises Provided -- -- 10/04/23 10/04/23 10/04/23     Oxygen   Maintain Oxygen Saturation -- 88% or higher 88% or higher 88% or higher 88% or higher            Exercise Comments:   Exercise Comments     Row Name 08/30/23 1728            Exercise Comments First full day of exercise!  Patient was oriented to gym and equipment including functions, settings, policies, and procedures.  Patient's individual exercise prescription and treatment plan were reviewed.  All starting workloads were established based on the results of the 6 minute walk test done at initial orientation visit.  The plan for exercise progression was also introduced and progression will be customized based on patient's performance and goals.                Exercise Goals and Review:   Exercise Goals     Row Name 08/26/23 0910             Exercise Goals   Increase Physical Activity Yes       Intervention Provide advice, education, support and counseling about physical activity/exercise needs.;Develop an individualized exercise prescription for aerobic and resistive training based on initial evaluation findings, risk stratification, comorbidities and participant's  personal goals.       Expected Outcomes Short Term: Attend rehab on a regular basis to increase amount of physical activity.;Long Term: Add in home exercise to make exercise part of routine and to increase amount of physical activity.;Long Term: Exercising regularly at least 3-5 days a week.       Increase Strength and Stamina Yes       Intervention Provide advice, education, support and counseling about physical activity/exercise needs.;Develop an individualized exercise prescription for aerobic and resistive training based on initial evaluation findings, risk stratification, comorbidities and participant's personal goals.       Expected Outcomes Short Term: Increase workloads from initial exercise prescription for resistance, speed, and METs.;Short Term: Perform resistance training exercises routinely during rehab and add in resistance training at home;Long Term: Improve cardiorespiratory fitness, muscular endurance and strength as measured by increased METs and functional capacity ( )       Able  to understand and use rate of perceived exertion (RPE) scale Yes       Intervention Provide education and explanation on how to use RPE scale       Expected Outcomes Short Term: Able to use RPE daily in rehab to express subjective intensity level;Long Term:  Able to use RPE to guide intensity level when exercising independently       Able to understand and use Dyspnea scale Yes       Intervention Provide education and explanation on how to use Dyspnea scale       Expected Outcomes Short Term: Able to use Dyspnea scale daily in rehab to express subjective sense of shortness of breath during exertion;Long Term: Able to use Dyspnea scale to guide intensity level when exercising independently       Knowledge and understanding of Target Heart Rate Range (THRR) Yes       Intervention Provide education and explanation of THRR including how the numbers were predicted and where they are located for reference       Expected Outcomes Short Term: Able to use daily as guideline for intensity in rehab;Short Term: Able to state/look up THRR;Long Term: Able to use THRR to govern intensity when exercising independently       Able to check pulse independently Yes       Intervention Provide education and demonstration on how to check pulse in carotid and radial arteries.;Review the importance of being able to check your own pulse for safety during independent exercise       Expected Outcomes Short Term: Able to explain why pulse checking is important during independent exercise;Long Term: Able to check pulse independently and accurately       Understanding of Exercise Prescription Yes       Intervention Provide education, explanation, and written materials on patient's individual exercise prescription       Expected Outcomes Short Term: Able to explain program exercise prescription;Long Term: Able to explain home exercise prescription to exercise independently                Exercise Goals Re-Evaluation :   Exercise Goals Re-Evaluation     Row Name 08/30/23 1728 09/16/23 1246 10/04/23 1737 10/14/23 1503 10/28/23 1147     Exercise Goal Re-Evaluation   Exercise Goals Review Able to understand and use rate of perceived exertion (RPE) scale;Increase Physical Activity;Knowledge and understanding of Target Heart Rate Range (THRR);Understanding of Exercise Prescription;Increase Strength and Stamina;Able to check pulse independently Increase Physical Activity;Increase Strength and Stamina;Understanding of Exercise Prescription Increase  Strength and Stamina;Increase Physical Activity;Able to understand and use rate of perceived exertion (RPE) scale;Able to understand and use Dyspnea scale;Knowledge and understanding of Target Heart Rate Range (THRR);Able to check pulse independently;Understanding of Exercise Prescription Increase Physical Activity;Increase Strength and Stamina;Understanding of Exercise Prescription Increase Physical Activity;Increase Strength and Stamina;Understanding of Exercise Prescription   Comments Reviewed RPE and dyspnea scale, THR and program prescription with pt today.  Pt voiced understanding and was given a copy of goals to take home. Robert Combs is off to a good start in the program. He was able to attend his first few sessions of rehab during this review. During his sessions he was able to increase his treadmill workload from his prescribed workload of to 3.60mph. He was also able to increase his level on the T4 nustep from his prescribed level 2 to a level 5. We will continue to monitor his improvements and progress in the program. Reviewed home exercise with pt today from 5:25 pm to 5:37 pm.  Pt plans to use his recumbent elliptical at home for exercise.  Reviewed THR, pulse, RPE, sign and symptoms, pulse oximetery and when to call 911 or MD.  Also discussed weather considerations and indoor options.  Pt voiced understanding. Robert Combs is doing well in rehab. He consistently works at level 5 on  the T4 nustep and level 2 on the XR. He did see a decrease in his treadmill workload with hi speed decreasing to 2.5 mph with no incline. We will continue to monitor his progress in the program. Robert Combs has only attended two sessions of rehab since the last review. He decreased his treadmill speed from 2.4 mph to 1.7 mph with no incline. He has continued to work at level 2 on the XR and level 5 on the T4 nustep. We will continue to monitor his progress in the program.   Expected Outcomes Short: Use RPE daily to regulate intensity.  Long: Follow program prescription in THR. Short: COntinue to increase treadmill workload. Long: Continue exercise to increase strength and stamina. Short: add 1-2 days a week of exercise at home on off days of pulmonary rehab. Long: maintain independent exercise routine. Short: Increase treadmill workload back up to previous level. Long: Continue exercise to improve strength and stamina. Short: Attend rehab more consistently, Increase treadmill workload back up to previous level. Long: Continue exercise to improve strength and stamina.    Row Name 11/09/23 1403 11/23/23 1351           Exercise Goal Re-Evaluation   Exercise Goals Review Increase Physical Activity;Increase Strength and Stamina;Understanding of Exercise Prescription Increase Physical Activity;Increase Strength and Stamina;Understanding of Exercise Prescription      Comments Robert Combs continues to be out on medical hold due to his back pain. He will be starting physical therapy for his back soon and will let us know when he plans to return to pulmonary rehab. We will continue to monitor his progress when he returns. Robert Combs continues to be out on medical hold due to his back pain. He will be starting physical therapy for his back soon and will let us know when he plans to return to pulmonary rehab. We will continue to monitor his progress when he returns.      Expected Outcomes Short: Return to rehab when appropriate.  Long: Graduate from the program. Short: Return to rehab when appropriate. Long: Graduate from the program.               Discharge Exercise Prescription (Final  Exercise Prescription Changes):  Exercise Prescription Changes - 10/28/23 1100       Response to Exercise   Blood Pressure (Admit) 122/60    Blood Pressure (Exit) 136/82    Heart Rate (Admit) 67 bpm    Heart Rate (Exercise) 100 bpm    Heart Rate (Exit) 78 bpm    Oxygen Saturation (Admit) 90 %    Oxygen Saturation (Exercise) 88 %    Oxygen Saturation (Exit) 92 %    Rating of Perceived Exertion (Exercise) 13    Perceived Dyspnea (Exercise) 1    Symptoms none    Duration Continue with 30 min of aerobic exercise without signs/symptoms of physical distress.    Intensity THRR unchanged      Progression   Progression Continue to progress workloads to maintain intensity without signs/symptoms of physical distress.    Average METs 2.53      Resistance Training   Training Prescription Yes    Weight 7 lb    Reps 10-15      Interval Training   Interval Training No      Treadmill   MPH 1.7    Grade 0    Minutes 15    METs 2.3      NuStep   Level 5    Minutes 15    METs 2      REL-XR   Level 2    Minutes 15    METs 3.5      Home Exercise Plan   Plans to continue exercise at Home (comment)   Rec. Elliptical   Frequency Add 2 additional days to program exercise sessions.    Initial Home Exercises Provided 10/04/23      Oxygen   Maintain Oxygen Saturation 88% or higher             Nutrition:  Target Goals: Understanding of nutrition guidelines, daily intake of sodium 1500mg , cholesterol 200mg , calories 30% from fat and 7% or less from saturated fats, daily to have 5 or more servings of fruits and vegetables.  Education: All About Nutrition: -Group instruction provided by verbal, written material, interactive activities, discussions, models, and posters to present general guidelines for heart healthy  nutrition including fat, fiber, MyPlate, the role of sodium in heart healthy nutrition, utilization of the nutrition label, and utilization of this knowledge for meal planning. Follow up email sent as well. Written material given at graduation.   Biometrics:  Pre Biometrics - 08/26/23 0911       Pre Biometrics   Height 5\' 10"  (1.778 m)    Weight 213 lb 11.2 oz (96.9 kg)    Waist Circumference 44 inches    Hip Circumference 42 inches    Waist to Hip Ratio 1.05 %    BMI (Calculated) 30.66    Single Leg Stand 8.9 seconds              Nutrition Therapy Plan and Nutrition Goals:  Nutrition Therapy & Goals - 08/26/23 1106       Nutrition Therapy   RD appointment deferred Yes      Intervention Plan   Intervention Prescribe, educate and counsel regarding individualized specific dietary modifications aiming towards targeted core components such as weight, hypertension, lipid management, diabetes, heart failure and other comorbidities.    Expected Outcomes Short Term Goal: Understand basic principles of dietary content, such as calories, fat, sodium, cholesterol and nutrients.;Short Term Goal: A plan has been developed with personal nutrition goals set during dietitian  appointment.;Long Term Goal: Adherence to prescribed nutrition plan.             Nutrition Assessments:  MEDIFICTS Score Key: >=70 Need to make dietary changes  40-70 Heart Healthy Diet <= 40 Therapeutic Level Cholesterol Diet  Flowsheet Row Pulmonary Rehab from 08/26/2023 in [facility]  Picture Your Plate Total Score on Admission 52      Picture Your Plate Scores: <16 Unhealthy dietary pattern with much room for improvement. 41-50 Dietary pattern unlikely to meet recommendations for good health and room for improvement. 51-60 More healthful dietary pattern, with some room for improvement.  >60 Healthy dietary pattern, although there may be some specific behaviors that could be  improved.   Nutrition Goals Re-Evaluation:  Nutrition Goals Re-Evaluation     Row Name 09/06/23 1720 10/04/23 1743           Goals   Nutrition Goal -- Patient continues to defer RD apt. He already meets with an RD at the [place].      Comment Patient was informed on why it is important to maintain a balanced diet when dealing with Respiratory issues. Explained that it takes a lot of energy to breath and when they are short of breath often they will need to have a good diet to help keep up with the calories they are expending for breathing. --      Expected Outcome Short: Choose and plan snacks accordingly to patients caloric intake to improve breathing. Long: Maintain a diet independently that meets their caloric intake to aid in daily shortness of breath. --               Nutrition Goals Discharge (Final Nutrition Goals Re-Evaluation):  Nutrition Goals Re-Evaluation - 10/04/23 1743       Goals   Nutrition Goal Patient continues to defer RD apt. He already meets with an RD at the [place].             Psychosocial: Target Goals: Acknowledge presence or absence of significant depression and/or stress, maximize coping skills, provide positive support system. Participant is able to verbalize types and ability to use techniques and skills needed for reducing stress and depression.   Education: Stress, Anxiety, and Depression - Group verbal and visual presentation to define topics covered.  Reviews how body is impacted by stress, anxiety, and depression.  Also discusses healthy ways to reduce stress and to treat/manage anxiety and depression.  Written material given at graduation.   Education: Sleep Hygiene -Provides group verbal and written instruction about how sleep can affect your health.  Define sleep hygiene, discuss sleep cycles and impact of sleep habits. Review good sleep hygiene tips.    Initial Review & Psychosocial Screening:  Initial Psych Review & Screening - 08/25/23 1148        Initial Review   Current issues with Current Stress Concerns;Current Depression;Current Sleep Concerns    Source of Stress Concerns Chronic Illness      Family Dynamics   Good Support System? Yes   local family and friends     Barriers   Psychosocial barriers to participate in program There are no identifiable barriers or psychosocial needs.;The patient should benefit from training in stress management and relaxation.      Screening Interventions   Interventions Provide feedback about the scores to participant;To provide support and resources with identified psychosocial needs;Encouraged to exercise    Expected Outcomes Short Term goal: Utilizing psychosocial counselor, staff and physician to  assist with identification of specific Stressors or current issues interfering with healing process. Setting desired goal for each stressor or current issue identified.;Long Term Goal: Stressors or current issues are controlled or eliminated.;Short Term goal: Identification and review with participant of any Quality of Life or Depression concerns found by scoring the questionnaire.;Long Term goal: The participant improves quality of Life and PHQ9 Scores as seen by post scores and/or verbalization of changes             Quality of Life Scores:  Scores of 19 and below usually indicate a poorer quality of life in these areas.  A difference of  2-3 points is a clinically meaningful difference.  A difference of 2-3 points in the total score of the Quality of Life Index has been associated with significant improvement in overall quality of life, self-image, physical symptoms, and general health in studies assessing change in quality of life.  PHQ-9: Review Flowsheet  More data exists      10/04/2023 08/26/2023 07/01/2021 06/03/2021 04/29/2021  Depression screen PHQ 2/9  Decreased Interest 0 2 1 2 2   Down, Depressed, Hopeless 1 2 0 1 2  PHQ - 2 Score 1 4 1 3 4   Altered sleeping 3 3 3 3 3   Tired,  decreased energy 2 3 3 1 2   Change in appetite 1 2 1 2 2   Feeling bad or failure about yourself  0 1 0 0 0  Trouble concentrating 1 2 1 2 2   Moving slowly or fidgety/restless 0 0 0 0 0  Suicidal thoughts 0 2 0 0 0  PHQ-9 Score 8 17 9 11 13   Difficult doing work/chores Somewhat difficult Somewhat difficult Somewhat difficult Somewhat difficult Combs difficult at all   Interpretation of Total Score  Total Score Depression Severity:  1-4 = Minimal depression, 5-9 = Mild depression, 10-14 = Moderate depression, 15-19 = Moderately severe depression, 20-27 = Severe depression   Psychosocial Evaluation and Intervention:  Psychosocial Evaluation - 08/25/23 1150       Psychosocial Evaluation & Interventions   Interventions Encouraged to exercise with the program and follow exercise prescription;Stress management education;Relaxation education    Comments Chrishon is returning to pulmonary rehab. He did the program a couple years ago and really enjoyed it, so he is looking forward to attending again. His breathing has gotten worse recently, which is altering his difficult sleep patterns even more. He has had sleep concerns for years and notes he is waking more frequently. He states he recently had a sleep study and was told he needs a cpap, but nothing has been given to him. He sees the [place] next week and plans to ask about a cpap. He has a history of PTSD which he states he feels is being managed throught [place], support from his family and friends, and setting personal goals. He admits to restarting smoking after 18 months of abstaining and thinks it was his depression that led him to restarting. However, in November he made the decision to quit again and really wants to stick to staying away from smoking. He wants to come to the program to work on his stamina, balance, and breathing    Expected Outcomes Short: attend pulmonary rehab for education and exercise. Long: develop and maintain positive self care habits.     Continue Psychosocial Services  Follow up required by staff             Psychosocial Re-Evaluation:  Psychosocial Re-Evaluation  Row Name 10/04/23 1751 10/04/23 1752           Psychosocial Re-Evaluation   Current issues with Current Depression;Current Sleep Concerns;History of Depression;Current Stress Concerns;Current Psychotropic Meds Current Depression;Current Sleep Concerns;History of Depression;Current Stress Concerns;Current Psychotropic Meds      Comments -- Robert Combs's PHQ9 was reevaluated and his score went down from 17 to 8. He states he is on treatment for depression and it is managed.      Expected Outcomes -- Short: continue to monitor mental health and notify doctor is there are any changes. Long: maintain good mental health habits.      Interventions -- Encouraged to attend Pulmonary Rehabilitation for the exercise      Continue Psychosocial Services  -- Follow up required by staff               Psychosocial Discharge (Final Psychosocial Re-Evaluation):  Psychosocial Re-Evaluation - 10/04/23 1752       Psychosocial Re-Evaluation   Current issues with Current Depression;Current Sleep Concerns;History of Depression;Current Stress Concerns;Current Psychotropic Meds    Comments Robert Combs's PHQ9 was reevaluated and his score went down from 17 to 8. He states he is on treatment for depression and it is managed.    Expected Outcomes Short: continue to monitor mental health and notify doctor is there are any changes. Long: maintain good mental health habits.    Interventions Encouraged to attend Pulmonary Rehabilitation for the exercise    Continue Psychosocial Services  Follow up required by staff             Education: Education Goals: Education classes will be provided on a weekly basis, covering required topics. Participant will state understanding/return demonstration of topics presented.  Learning Barriers/Preferences:  Learning Barriers/Preferences -  08/25/23 1148       Learning Barriers/Preferences   Learning Barriers None    Learning Preferences None             General Pulmonary Education Topics:  Infection Prevention: - Provides verbal and written material to individual with discussion of infection control including proper hand washing and proper equipment cleaning during exercise session. Flowsheet Row Pulmonary Rehab from 08/26/2023 in [facility] and Pulmonary Rehab  Date 08/26/23  Educator NT  Instruction Review Code 1- Verbalizes Understanding       Falls Prevention: - Provides verbal and written material to individual with discussion of falls prevention and safety. Flowsheet Row Pulmonary Rehab from 08/26/2023 in [facility] Re[facility]  Date 08/26/23  Educator NT  Instruction Review Code 1- Verbalizes Understanding       Chronic Lung Disease Review: - Group verbal instruction with posters, models, PowerPoint presentations and videos,  to review new updates, new respiratory medications, new advancements in procedures and treatments. Providing information on websites and "800" numbers for continued self-education. Includes information about supplement oxygen, available portable oxygen systems, continuous and intermittent flow rates, oxygen safety, concentrators, and Medicare reimbursement for oxygen. Explanation of Pulmonary Drugs, including class, frequency, complications, importance of spacers, rinsing mouth after steroid MDI's, and proper cleaning methods for nebulizers. Review of basic lung anatomy and physiology related to function, structure, and complications of lung disease. Review of risk factors. Discussion about methods for diagnosing sleep apnea and types of masks and machines for OSA. Includes a review of the use of types of environmental controls: home humidity, furnaces, filters, dust mite/pet prevention, HEPA vacuums. Discussion about weather changes, air quality and the benefits of nasal  washing. Instruction on Warning signs,  infection symptoms, calling MD promptly, preventive modes, and value of vaccinations. Review of effective airway clearance, coughing and/or vibration techniques. Emphasizing that all should Create an Action Plan. Written material given at graduation. Flowsheet Row Pulmonary Rehab from 08/26/2023 in [facility]  Education need identified 08/26/23       AED/CPR: - Group verbal and written instruction with the use of models to demonstrate the basic use of the AED with the basic ABC's of resuscitation.    Anatomy and Cardiac Procedures: - Group verbal and visual presentation and models provide information about basic cardiac anatomy and function. Reviews the testing methods done to diagnose heart disease and the outcomes of the test results. Describes the treatment choices: Medical Management, Angioplasty, or Coronary Bypass Surgery for treating various heart conditions including Myocardial Infarction, Angina, Valve Disease, and Cardiac Arrhythmias.  Written material given at graduation. Flowsheet Row Pulmonary Rehab from 04/17/2021 in [facility] Card[facility] and Pulmonary Rehab  Date 04/17/21  Educator KB  Instruction Review Code 1- Verbalizes Understanding       Medication Safety: - Group verbal and visual instruction to review commonly prescribed medications for heart and lung disease. Reviews the medication, class of the drug, and side effects. Includes the steps to properly store meds and maintain the prescription regimen.  Written material given at graduation. Flowsheet Row Pulmonary Rehab from 08/26/2023 in [facility] Card[facility] Pulmonary Rehab  Education need identified 08/26/23       Other: -Provides group and verbal instruction on various topics (see comments)   Knowledge Questionnaire Score:  Knowledge Questionnaire Score - 08/26/23 1104       Knowledge Questionnaire Score   Pre Score 15/18              Core Components/Risk  Factors/Patient Goals at Admission:  Personal Goals and Risk Factors at Admission - 08/25/23 1138       Core Components/Risk Factors/Patient Goals on Admission   Tobacco Cessation Yes   Quit November 2024   Intervention Offer Photographer, assist with locating and accessing local/national Quit Smoking programs, and support quit date choice.;Assist the participant in steps to quit. Provide individualized education and counseling about committing to Tobacco Cessation, relapse prevention, and pharmacological support that can be provided by physician.    Expected Outcomes Short Term: Will quit all tobacco product use, adhering to prevention of relapse plan.;Long Term: Complete abstinence from all tobacco products for at least 12 months from quit date.    Improve shortness of breath with ADL's Yes    Intervention Provide education, individualized exercise plan and daily activity instruction to help decrease symptoms of SOB with activities of daily living.    Expected Outcomes Short Term: Improve cardiorespiratory fitness to achieve a reduction of symptoms when performing ADLs;Long Term: Be able to perform more ADLs without symptoms or delay the onset of symptoms    Diabetes Yes    Intervention Provide education about signs/symptoms and action to take for hypo/hyperglycemia.;Provide education about proper nutrition, including hydration, and aerobic/resistive exercise prescription along with prescribed medications to achieve blood glucose in normal ranges: Fasting glucose 65-99 mg/dL    Expected Outcomes Short Term: Participant verbalizes understanding of the signs/symptoms and immediate care of hyper/hypoglycemia, proper foot care and importance of medication, aerobic/resistive exercise and nutrition plan for blood glucose control.;Long Term: Attainment of HbA1C < 7%.    Hypertension Yes    Intervention Provide education on lifestyle modifcations including regular physical activity/exercise,  weight management, moderate sodium restriction and  increased consumption of fresh fruit, vegetables, and low fat dairy, alcohol moderation, and smoking cessation.;Monitor prescription use compliance.    Expected Outcomes Short Term: Continued assessment and intervention until BP is < 140/62mm HG in hypertensive participants. < 130/32mm HG in hypertensive participants with diabetes, heart failure or chronic kidney disease.;Long Term: Maintenance of blood pressure at goal levels.    Lipids Yes    Intervention Provide education and support for participant on nutrition & aerobic/resistive exercise along with prescribed medications to achieve LDL 70mg , HDL >40mg .    Expected Outcomes Short Term: Participant states understanding of desired cholesterol values and is compliant with medications prescribed. Participant is following exercise prescription and nutrition guidelines.;Long Term: Cholesterol controlled with medications as prescribed, with individualized exercise RX and with personalized nutrition plan. Value goals: LDL < 70mg , HDL > 40 mg.             Education:Diabetes - Individual verbal and written instruction to review signs/symptoms of diabetes, desired ranges of glucose level fasting, after meals and with exercise. Acknowledge that pre and post exercise glucose checks will be done for 3 sessions at entry of program. Flowsheet Row Pulmonary Rehab from 08/26/2023 in [facility] Pulmonary Re[facility]  Date 08/25/23  Educator [facility]  Instruction Review Code 1- Verbalizes Understanding       Know Your Numbers and Heart Failure: - Group verbal and visual instruction to discuss disease risk factors for cardiac and pulmonary disease and treatment options.  Reviews associated critical values for Overweight/Obesity, Hypertension, Cholesterol, and Diabetes.  Discusses basics of heart failure: signs/symptoms and treatments.  Introduces Heart Failure Zone chart for action plan for heart failure.  Written  material given at graduation.   Core Components/Risk Factors/Patient Goals Review:   Goals and Risk Factor Review     Row Name 09/06/23 1720 10/04/23 1748           Core Components/Risk Factors/Patient Goals Review   Personal Goals Review Improve shortness of breath with ADL's Hypertension;Diabetes;Improve shortness of breath with ADL's      Review Spoke to patient about their shortness of breath and what they can do to improve. Patient has been informed of breathing techniques when starting the program. Patient is informed to tell staff if they have had any med changes and that certain meds they are taking or Combs taking can be causing shortness of breath. Patient reports he is Combs dealing with much SOB anymore and is doing well with that. He Combs occationally check his BP at home and Combs own a cuff. He was encouraged to become more routine in monitoring is BP at home. He stated he has all the supplies he needs to check his blood sugar but needs to get in a better habit of monitoring it more regularly. He plans to start checking it once a day.      Expected Outcomes Short: Attend [facility] regularly to improve shortness of breath with ADL's. Long: maintain independence with ADL's Short: start checking blood sugar every day. Long: independently monitor BP and blood sugar regularly and consistently.               Core Components/Risk Factors/Patient Goals at Discharge (Final Review):   Goals and Risk Factor Review - 10/04/23 1748       Core Components/Risk Factors/Patient Goals Review   Personal Goals Review Hypertension;Diabetes;Improve shortness of breath with ADL's    Review Patient reports he is Combs dealing with much SOB anymore and is doing well with that.  He Combs occationally check his BP at home and Combs own a cuff. He was encouraged to become more routine in monitoring is BP at home. He stated he has all the supplies he needs to check his blood sugar but needs to get in a better  habit of monitoring it more regularly. He plans to start checking it once a day.    Expected Outcomes Short: start checking blood sugar every day. Long: independently monitor BP and blood sugar regularly and consistently.             ITP Comments:  ITP Comments     Row Name 08/25/23 1144 08/26/23 0908 08/30/23 1728 09/01/23 1316 09/29/23 0838   ITP Comments Initial phone call completed. Diagnosis can be found in [facility] 11/20. EP Orientation scheduled for Thursday 1/9 at 8am. Completed and gym orientation. Initial ITP created and sent for review to Dr. Jinny Sanders, Medical Director. First full day of exercise!  Patient was oriented to gym and equipment including functions, settings, policies, and procedures.  Patient's individual exercise prescription and treatment plan were reviewed.  All starting workloads were established based on the results of the 6 minute walk test done at initial orientation visit.  The plan for exercise progression was also introduced and progression will be customized based on patient's performance and goals. 30 Day review completed. Medical Director ITP review done, changes made as directed, and signed approval by Medical Director. 30 Day review completed. Medical Director ITP review done, changes made as directed, and signed approval by Medical Director.    Row Name 10/20/23 1623 10/27/23 0847 11/24/23 1128       ITP Comments Patient states he has a bulging disc and has had an MRI. His doctor suggested he waits to resume rehab until he has talked to a Careers adviser. Informed patient that he will be put on a medical hold and to call back when he is able to speak with the surgeon. 30 Day review completed. Medical Director ITP review done, changes made as directed, and signed approval by Medical Director. 30 Day review completed. Medical Director ITP review done, changes made as directed, and signed approval by Medical Director.              Comments:

## 2023-12-22 DIAGNOSIS — J449 Chronic obstructive pulmonary disease, unspecified: Secondary | ICD-10-CM

## 2023-12-22 NOTE — Progress Notes (Signed)
 30 Day review completed. Medical Director ITP review done, changes made as directed, and signed approval by Medical Director. ? ?

## 2023-12-22 NOTE — Progress Notes (Signed)
 Pulmonary Individual Treatment Plan  Patient Details  Name: ROANAN BODLEY MRN: [id number] Date of Birth: [birth year] Referring Provider:   Flowsheet Row Pulmonary Rehab from 08/26/2023 in [facility] Cardiac and Pulmonary Rehab  Referring Provider Dr. Artie Laster       Initial Encounter Date:  Flowsheet Row Pulmonary Rehab from 08/26/2023 in [facility] Cardiac and Pulmonary Rehab  Date 08/26/23       Visit Diagnosis: Chronic obstructive pulmonary disease, unspecified COPD type (HCC)  Patient's Home Medications on Admission:  Current Outpatient Medications:    albuterol (PROVENTIL HFA;VENTOLIN HFA) 108 (90 Base) MCG/ACT inhaler, Inhale 1 puff into the lungs every 6 (six) hours as needed for wheezing or shortness of breath., Disp: , Rfl:    albuterol (VENTOLIN HFA) 108 (90 Base) MCG/ACT inhaler, Inhale into the lungs., Disp: , Rfl:    Alogliptin Benzoate 25 MG TABS, Take by mouth., Disp: , Rfl:    aspirin EC 81 MG tablet, Take 81 mg by mouth daily.  , Disp: , Rfl:    atorvastatin (LIPITOR) 40 MG tablet, Take 40 mg by mouth daily., Disp: , Rfl:    azelastine  (ASTELIN ) 0.1 % nasal spray, Place 2 sprays into both nostrils 2 (two) times daily as needed for rhinitis. Use in each nostril as directed, Disp: 30 mL, Rfl: 5   cholecalciferol (VITAMIN D) 1000 UNITS tablet, Take 2,000 Units by mouth daily., Disp: , Rfl:    fluticasone  (FLONASE ) 50 MCG/ACT nasal spray, Place 2 sprays into both nostrils daily., Disp: 16 g, Rfl: 5   folic acid (FOLVITE) 1 MG tablet, Take 1 mg by mouth daily., Disp: , Rfl:    gabapentin (NEURONTIN) 300 MG capsule, Take 300 mg by mouth.  TAKE ONE CAPSULE BY MOUTH EVERY MORNING AND TAKE ONE CAPSULE AT NOON AND TAKE TWO CAPSULES EVERY EVENING FOR NERVE PAIN/BURNING, Disp: , Rfl:    hydrochlorothiazide (HYDRODIURIL) 25 MG tablet, Take 1 tablet by mouth every morning., Disp: , Rfl:    losartan (COZAAR) 100 MG tablet, Take 100 mg by mouth daily., Disp: , Rfl:    Magnesium Oxide 420 MG  TABS, Take 420 mg by mouth daily., Disp: , Rfl:    vardenafil (LEVITRA) 20 MG tablet, Take 10 mg by mouth., Disp: , Rfl:   Past Medical History: Past Medical History:  Diagnosis Date   Chronic kidney disease    COPD (chronic obstructive pulmonary disease) (HCC)    Coronary artery disease    DDD (degenerative disc disease)    Diabetes mellitus without complication (HCC)    Hyperlipidemia    Hypertension     Tobacco Use: Social History   Tobacco Use  Smoking Status Every Day   Types: Cigarettes   Passive exposure: Current  Smokeless Tobacco Never  Tobacco Comments   Started back in January 2025    Labs: Review Flowsheet       Latest Ref Rng & Units 02/03/2010 11/04/2015  Labs for ITP Cardiac and Pulmonary Rehab  Bicarbonate 20.0 - 24.0 mEq/L - 26.3   TCO2 0 - 100 mmol/L 31  28   O2 Saturation % - 75.0      Pulmonary Assessment Scores:  Pulmonary Assessment Scores     Row Name 08/26/23 1105         ADL UCSD   ADL Phase Entry     SOB Score total 55     Rest 0     Walk 0     Stairs 5  Bath 0     Dress 0     Shop 0       CAT Score   CAT Score 26       mMRC Score   mMRC Score 1              UCSD: Self-administered rating of dyspnea associated with activities of daily living (ADLs) 6-point scale (0 = "not at all" to 5 = "maximal or unable to do because of breathlessness")  Scoring Scores range from 0 to 120.  Minimally important difference is 5 units  CAT: CAT can identify the health impairment of COPD patients and is better correlated with disease progression.  CAT has a scoring range of zero to 40. The CAT score is classified into four groups of low (less than 10), medium (10 - 20), high (21-30) and very high (31-40) based on the impact level of disease on health status. A CAT score over 10 suggests significant symptoms.  A worsening CAT score could be explained by an exacerbation, poor medication adherence, poor inhaler technique, or progression  of COPD or comorbid conditions.  CAT MCID is 2 points  mMRC: mMRC (Modified Medical Research Council) Dyspnea Scale is used to assess the degree of baseline functional disability in patients of respiratory disease due to dyspnea. No minimal important difference is established. A decrease in score of 1 point or greater is considered a positive change.   Pulmonary Function Assessment:   Exercise Target Goals: Exercise Program Goal: Individual exercise prescription set using results from initial 6 min walk test and THRR while considering  patient's activity barriers and safety.   Exercise Prescription Goal: Initial exercise prescription builds to 30-45 minutes a day of aerobic activity, 2-3 days per week.  Home exercise guidelines will be given to patient during program as part of exercise prescription that the participant will acknowledge.  Education: Aerobic Exercise: - Group verbal and visual presentation on the components of exercise prescription. Introduces F.I.T.T principle from ACSM for exercise prescriptions.  Reviews F.I.T.T. principles of aerobic exercise including progression. Written material given at graduation. Flowsheet Row Pulmonary Rehab from 04/17/2021 in [facility] Cardiac and Pulmonary Rehab  Date 02/06/21  Educator [facility]  Instruction Review Code 1- Verbalizes Understanding       Education: Resistance Exercise: - Group verbal and visual presentation on the components of exercise prescription. Introduces F.I.T.T principle from ACSM for exercise prescriptions  Reviews F.I.T.T. principles of resistance exercise including progression. Written material given at graduation. Flowsheet Row Pulmonary Rehab from 04/17/2021 in [facility] Cardiac and Pulmonary Rehab  Date 04/17/21  Educator [facility]  Instruction Review Code 1- Verbalizes Understanding        Education: Exercise & Equipment Safety: - Individual verbal instruction and demonstration of equipment use and safety with use of the  equipment. Flowsheet Row Pulmonary Rehab from 08/26/2023 in [facility] Cardiac and Pulmonary Rehab  Date 08/26/23  Educator NT  Instruction Review Code 1- Verbalizes Understanding       Education: Exercise Physiology & General Exercise Guidelines: - Group verbal and written instruction with models to review the exercise physiology of the cardiovascular system and associated critical values. Provides general exercise guidelines with specific guidelines to those with heart or lung disease.    Education: Flexibility, Balance, Mind/Body Relaxation: - Group verbal and visual presentation with interactive activity on the components of exercise prescription. Introduces F.I.T.T principle from ACSM for exercise prescriptions. Reviews F.I.T.T. principles of flexibility and balance exercise training including progression. Also discusses the  mind body connection.  Reviews various relaxation techniques to help reduce and manage stress (i.e. Deep breathing, progressive muscle relaxation, and visualization). Balance handout provided to take home. Written material given at graduation. Flowsheet Row Pulmonary Rehab from 04/17/2021 in [facility] Cardiac and Pulmonary Rehab  Date 02/20/21  Educator [facility]  Instruction Review Code 1- Verbalizes Understanding       Activity Barriers & Risk Stratification:  Activity Barriers & Cardiac Risk Stratification - 08/25/23 1135       Activity Barriers & Cardiac Risk Stratification   Activity Barriers Shortness of Breath;Deconditioning;Muscular Weakness;Balance Concerns   neuropathy (right leg worse)            6 Minute Walk:  6 Minute Walk     Row Name 08/26/23 0908         6 Minute Walk   Phase Initial     Distance 1340 feet     Walk Time 6 minutes     # of Rest Breaks 0     MPH 2.54     METS 2.49     RPE 13     Perceived Dyspnea  3     VO2 Peak 8.71     Symptoms Yes (comment)     Comments leg weakness     Resting HR 58 bpm     Resting BP 124/62     Resting  Oxygen Saturation  94 %     Exercise Oxygen Saturation  during 6 min walk 88 %     Max Ex. HR 91 bpm     Max Ex. BP 136/64     2 Minute Post BP 126/58       Interval HR   1 Minute HR 63     2 Minute HR 73     3 Minute HR 70     4 Minute HR 78     5 Minute HR 81     6 Minute HR 91     2 Minute Post HR 63     Interval Heart Rate? Yes       Interval Oxygen   Interval Oxygen? Yes     Baseline Oxygen Saturation % 94 %     1 Minute Oxygen Saturation % 92 %     1 Minute Liters of Oxygen 0 L  RA     2 Minute Oxygen Saturation % 90 %     2 Minute Liters of Oxygen 0 L     3 Minute Oxygen Saturation % 88 %     3 Minute Liters of Oxygen 0 L     4 Minute Oxygen Saturation % 90 %     4 Minute Liters of Oxygen 0 L     5 Minute Oxygen Saturation % 89 %     5 Minute Liters of Oxygen 0 L     6 Minute Oxygen Saturation % 88 %     6 Minute Liters of Oxygen 0 L     2 Minute Post Oxygen Saturation % 96 %     2 Minute Post Liters of Oxygen 0 L             Oxygen Initial Assessment:  Oxygen Initial Assessment - 08/25/23 1148       Home Oxygen   Home Oxygen Device None    Sleep Oxygen Prescription None    Home Exercise Oxygen Prescription None    Home Resting Oxygen Prescription None    Compliance with  Home Oxygen Use Yes      Intervention   Short Term Goals To learn and understand importance of monitoring SPO2 with pulse oximeter and demonstrate accurate use of the pulse oximeter.;To learn and understand importance of maintaining oxygen saturations>88%;To learn and demonstrate proper pursed lip breathing techniques or other breathing techniques. ;To learn and demonstrate proper use of respiratory medications    Long  Term Goals Verbalizes importance of monitoring SPO2 with pulse oximeter and return demonstration;Maintenance of O2 saturations>88%;Exhibits proper breathing techniques, such as pursed lip breathing or other method taught during program session;Compliance with respiratory  medication             Oxygen Re-Evaluation:  Oxygen Re-Evaluation     Row Name 08/30/23 1729 09/06/23 1712 10/04/23 1739         Program Oxygen Prescription   Program Oxygen Prescription -- None None       Home Oxygen   Home Oxygen Device -- None None     Sleep Oxygen Prescription -- None None     Home Exercise Oxygen Prescription -- None None     Home Resting Oxygen Prescription -- None None     Compliance with Home Oxygen Use -- -- Yes       Goals/Expected Outcomes   Short Term Goals -- To learn and demonstrate proper pursed lip breathing techniques or other breathing techniques.  To learn and demonstrate proper pursed lip breathing techniques or other breathing techniques. ;To learn and understand importance of maintaining oxygen saturations>88%;To learn and understand importance of monitoring SPO2 with pulse oximeter and demonstrate accurate use of the pulse oximeter.     Long  Term Goals -- Exhibits proper breathing techniques, such as pursed lip breathing or other method taught during program session Maintenance of O2 saturations>88%;Verbalizes importance of monitoring SPO2 with pulse oximeter and return demonstration;Exhibits proper breathing techniques, such as pursed lip breathing or other method taught during program session     Comments Reviewed PLB technique with pt.  Talked about how it works and it's importance in maintaining their exercise saturations. Informed patient how to perform the Pursed Lipped breathing technique. Told patient to Inhale through the nose and out the mouth with pursed lips to keep their airways open, help oxygenate them better, practice when at rest or doing strenuous activity. Patient Verbalizes understanding of technique and will work on and be reiterated during [facility]. Selmer does not currently own a pulse oxymeter. He was encouraged to get one to be able to monitor his heart rate and oxygen saturations at home, specifically during home  exercise. [facility] one. He does state that he uses PLB techniques to help control SOB and improve SaO2 numbers.     Goals/Expected Outcomes Short: Become more profiecient at using PLB.   Long: Become independent at using PLB. Short: use PLB with exertion. Long: use PLB on exertion proficiently and independently. Short: talk to [place] about getting a pulse oxymeter. Long: monitor SaO2 and use PLB independently to control SOB              Oxygen Discharge (Final Oxygen Re-Evaluation):  Oxygen Re-Evaluation - 10/04/23 1739       Program Oxygen Prescription   Program Oxygen Prescription None      Home Oxygen   Home Oxygen Device None    Sleep Oxygen Prescription None    Home Exercise Oxygen Prescription None    Home Resting Oxygen  Prescription None    Compliance with Home Oxygen Use Yes      Goals/Expected Outcomes   Short Term Goals To learn and demonstrate proper pursed lip breathing techniques or other breathing techniques. ;To learn and understand importance of maintaining oxygen saturations>88%;To learn and understand importance of monitoring SPO2 with pulse oximeter and demonstrate accurate use of the pulse oximeter.    Long  Term Goals Maintenance of O2 saturations>88%;Verbalizes importance of monitoring SPO2 with pulse oximeter and return demonstration;Exhibits proper breathing techniques, such as pursed lip breathing or other method taught during program session    Comments Ygnacio does not currently own a pulse oxymeter. He was encouraged to get one to be able to monitor his heart rate and oxygen saturations at home, specifically during home exercise. [facility] one. He does state that he uses PLB techniques to help control SOB and improve SaO2 numbers.    Goals/Expected Outcomes Short: talk to [place] about getting a pulse oxymeter. Long: monitor SaO2 and use PLB independently to control SOB              Initial Exercise Prescription:  Initial Exercise Prescription - 08/26/23 1100       Date of Initial Exercise RX and Referring Provider   Date 08/26/23    Referring Provider Dr. Artie Laster      Oxygen   Maintain Oxygen Saturation 88% or higher      Treadmill   MPH 2    Grade 0    Minutes 15    METs 2.53      NuStep   Level 2    SPM 80    Minutes 15    METs 2.49      REL-XR   Level 2    Speed 50    Minutes 15    METs 2.49      Prescription Details   Frequency (times per week) 3    Duration Progress to 30 minutes of continuous aerobic without signs/symptoms of physical distress      Intensity   THRR 40-80% of Max Heartrate 92-127    Ratings of Perceived Exertion 11-13    Perceived Dyspnea 0-4      Progression   Progression Continue to progress workloads to maintain intensity without signs/symptoms of physical distress.      Resistance Training   Training Prescription Yes    Weight 7 lb    Reps 10-15             Perform Capillary Blood Glucose checks as needed.  Exercise Prescription Changes:   Exercise Prescription Changes     Row Name 08/26/23 1100 09/16/23 1200 10/04/23 1700 10/14/23 1500 10/28/23 1100     Response to Exercise   Blood Pressure (Admit) 124/62 142/70 -- 148/87 122/60   Blood Pressure (Exercise) 136/64 156/82 -- 152/82 --   Blood Pressure (Exit) 126/58 138/70 -- 118/70 136/82   Heart Rate (Admit) 58 bpm 63 bpm -- 68 bpm 67 bpm   Heart Rate (Exercise) 91 bpm 103 bpm -- 103 bpm 100 bpm   Heart Rate (Exit) 63 bpm 70 bpm -- 74 bpm 78 bpm   Oxygen Saturation (Admit) 94 % 93 % -- 91 % 90 %   Oxygen Saturation (Exercise) 88 % 90 % -- 88 % 88 %   Oxygen Saturation (Exit) 96 % 94 % -- 91 % 92 %   Rating of Perceived Exertion (Exercise)  13 13 -- 13 13   Perceived Dyspnea (Exercise) 3 2 -- 1 1   Symptoms leg weakness none -- none none   Comments Results First 2 weeks of exercise -- -- --   Duration -- Progress to 30  minutes of  aerobic without signs/symptoms of physical distress Progress to 30 minutes of  aerobic without signs/symptoms of physical distress Continue with 30 min of aerobic exercise without signs/symptoms of physical distress. Continue with 30 min of aerobic exercise without signs/symptoms of physical distress.   Intensity -- THRR unchanged THRR unchanged THRR unchanged THRR unchanged     Progression   Progression -- Continue to progress workloads to maintain intensity without signs/symptoms of physical distress. Continue to progress workloads to maintain intensity without signs/symptoms of physical distress. Continue to progress workloads to maintain intensity without signs/symptoms of physical distress. Continue to progress workloads to maintain intensity without signs/symptoms of physical distress.   Average METs -- 2.76 2.76 2.67 2.53     Resistance Training   Training Prescription -- Yes Yes Yes Yes   Weight -- 7lb 7lb 7 lb 7 lb   Reps -- 10-15 10-15 10-15 10-15     Interval Training   Interval Training -- No No No No     Treadmill   MPH -- 3.5 3.5 2.5 1.7   Grade -- 0 0 0 0   Minutes -- 15 15 15 15    METs -- 3.68 3.68 2.91 2.3     NuStep   Level -- 5 5 5 5    Minutes -- 15 15 15 15    METs -- 2.5 2.5 2 2      REL-XR   Level -- 2 2 2 2    Minutes -- 15 15 15 15    METs -- 3 3 3.5 3.5     Home Exercise Plan   Plans to continue exercise at -- -- Home (comment)  Rec. Elliptical Home (comment)  Rec. Elliptical Home (comment)  Rec. Elliptical   Frequency -- -- Add 2 additional days to program exercise sessions. Add 2 additional days to program exercise sessions. Add 2 additional days to program exercise sessions.   Initial Home Exercises Provided -- -- 10/04/23 10/04/23 10/04/23     Oxygen   Maintain Oxygen Saturation -- 88% or higher 88% or higher 88% or higher 88% or higher            Exercise Comments:   Exercise Comments     Row Name 08/30/23 1728            Exercise Comments First full day of exercise!  Patient was oriented to gym and equipment including functions, settings, policies, and procedures.  Patient's individual exercise prescription and treatment plan were reviewed.  All starting workloads were established based on the results of the 6 minute walk test done at initial orientation visit.  The plan for exercise progression was also introduced and progression will be customized based on patient's performance and goals.                Exercise Goals and Review:   Exercise Goals     Row Name 08/26/23 0910             Exercise Goals   Increase Physical Activity Yes       Intervention Provide advice, education, support and counseling about physical activity/exercise needs.;Develop an individualized exercise prescription for aerobic and resistive training based on initial evaluation findings, risk stratification, comorbidities and participant's  personal goals.       Expected Outcomes Short Term: Attend rehab on a regular basis to increase amount of physical activity.;Long Term: Add in home exercise to make exercise part of routine and to increase amount of physical activity.;Long Term: Exercising regularly at least 3-5 days a week.       Increase Strength and Stamina Yes       Intervention Provide advice, education, support and counseling about physical activity/exercise needs.;Develop an individualized exercise prescription for aerobic and resistive training based on initial evaluation findings, risk stratification, comorbidities and participant's personal goals.       Expected Outcomes Short Term: Increase workloads from initial exercise prescription for resistance, speed, and METs.;Short Term: Perform resistance training exercises routinely during rehab and add in resistance training at home;Long Term: Improve cardiorespiratory fitness, muscular endurance and strength as measured by increased METs and functional capacity ( )       Able  to understand and use rate of perceived exertion (RPE) scale Yes       Intervention Provide education and explanation on how to use RPE scale       Expected Outcomes Short Term: Able to use RPE daily in rehab to express subjective intensity level;Long Term:  Able to use RPE to guide intensity level when exercising independently       Able to understand and use Dyspnea scale Yes       Intervention Provide education and explanation on how to use Dyspnea scale       Expected Outcomes Short Term: Able to use Dyspnea scale daily in rehab to express subjective sense of shortness of breath during exertion;Long Term: Able to use Dyspnea scale to guide intensity level when exercising independently       Knowledge and understanding of Target Heart Rate Range (THRR) Yes       Intervention Provide education and explanation of THRR including how the numbers were predicted and where they are located for reference       Expected Outcomes Short Term: Able to use daily as guideline for intensity in rehab;Short Term: Able to state/look up THRR;Long Term: Able to use THRR to govern intensity when exercising independently       Able to check pulse independently Yes       Intervention Provide education and demonstration on how to check pulse in carotid and radial arteries.;Review the importance of being able to check your own pulse for safety during independent exercise       Expected Outcomes Short Term: Able to explain why pulse checking is important during independent exercise;Long Term: Able to check pulse independently and accurately       Understanding of Exercise Prescription Yes       Intervention Provide education, explanation, and written materials on patient's individual exercise prescription       Expected Outcomes Short Term: Able to explain program exercise prescription;Long Term: Able to explain home exercise prescription to exercise independently                Exercise Goals Re-Evaluation :   Exercise Goals Re-Evaluation     Row Name 08/30/23 1728 09/16/23 1246 10/04/23 1737 10/14/23 1503 10/28/23 1147     Exercise Goal Re-Evaluation   Exercise Goals Review Able to understand and use rate of perceived exertion (RPE) scale;Increase Physical Activity;Knowledge and understanding of Target Heart Rate Range (THRR);Understanding of Exercise Prescription;Increase Strength and Stamina;Able to check pulse independently Increase Physical Activity;Increase Strength and Stamina;Understanding of Exercise Prescription Increase  Strength and Stamina;Increase Physical Activity;Able to understand and use rate of perceived exertion (RPE) scale;Able to understand and use Dyspnea scale;Knowledge and understanding of Target Heart Rate Range (THRR);Able to check pulse independently;Understanding of Exercise Prescription Increase Physical Activity;Increase Strength and Stamina;Understanding of Exercise Prescription Increase Physical Activity;Increase Strength and Stamina;Understanding of Exercise Prescription   Comments Reviewed RPE and dyspnea scale, THR and program prescription with pt today.  Pt voiced understanding and was given a copy of goals to take home. Heinz is off to a good start in the program. He was able to attend his first few sessions of rehab during this review. During his sessions he was able to increase his treadmill workload from his prescribed workload of to 3.107mph. He was also able to increase his level on the T4 nustep from his prescribed level 2 to a level 5. We will continue to monitor his improvements and progress in the program. Reviewed home exercise with pt today from 5:25 pm to 5:37 pm.  Pt plans to use his recumbent elliptical at home for exercise.  Reviewed THR, pulse, RPE, sign and symptoms, pulse oximetery and when to call 911 or MD.  Also discussed weather considerations and indoor options.  Pt voiced understanding. Caelum is doing well in rehab. He consistently works at level 5 on  the T4 nustep and level 2 on the XR. He did see a decrease in his treadmill workload with hi speed decreasing to 2.5 mph with no incline. We will continue to monitor his progress in the program. [place] has only attended two sessions of rehab since the last review. He decreased his treadmill speed from 2.4 mph to 1.7 mph with no incline. He has continued to work at level 2 on the XR and level 5 on the T4 nustep. We will continue to monitor his progress in the program.   Expected Outcomes Short: Use RPE daily to regulate intensity.  Long: Follow program prescription in THR. Short: COntinue to increase treadmill workload. Long: Continue exercise to increase strength and stamina. Short: add 1-2 days a week of exercise at home on off days of pulmonary rehab. Long: maintain independent exercise routine. Short: Increase treadmill workload back up to previous level. Long: Continue exercise to improve strength and stamina. Short: Attend rehab more consistently, Increase treadmill workload back up to previous level. Long: Continue exercise to improve strength and stamina.    Row Name 11/09/23 1403 11/23/23 1351 12/09/23 1724         Exercise Goal Re-Evaluation   Exercise Goals Review Increase Physical Activity;Increase Strength and Stamina;Understanding of Exercise Prescription Increase Physical Activity;Increase Strength and Stamina;Understanding of Exercise Prescription --     Comments Chistopher continues to be out on medical hold due to his back pain. He will be starting physical therapy for his back soon and will let us  know when he plans to return to pulmonary rehab. We will continue to monitor his progress when he returns. Jhamal continues to be out on medical hold due to his back pain. He will be starting physical therapy for his back soon and will let us  know when he plans to return to pulmonary rehab. We will continue to monitor his progress when he returns. Clester continues to be out on medical hold due to his  back pain. He has physical therapy for his back and will let us  know when he plans to return to pulmonary rehab. We will continue to monitor his progress when he returns.  Expected Outcomes Short: Return to rehab when appropriate. Long: Graduate from the program. Short: Return to rehab when appropriate. Long: Graduate from the program. Short: Return to rehab when appropriate. Long: Graduate from the program.              Discharge Exercise Prescription (Final Exercise Prescription Changes):  Exercise Prescription Changes - 10/28/23 1100       Response to Exercise   Blood Pressure (Admit) 122/60    Blood Pressure (Exit) 136/82    Heart Rate (Admit) 67 bpm    Heart Rate (Exercise) 100 bpm    Heart Rate (Exit) 78 bpm    Oxygen Saturation (Admit) 90 %    Oxygen Saturation (Exercise) 88 %    Oxygen Saturation (Exit) 92 %    Rating of Perceived Exertion (Exercise) 13    Perceived Dyspnea (Exercise) 1    Symptoms none    Duration Continue with 30 min of aerobic exercise without signs/symptoms of physical distress.    Intensity THRR unchanged      Progression   Progression Continue to progress workloads to maintain intensity without signs/symptoms of physical distress.    Average METs 2.53      Resistance Training   Training Prescription Yes    Weight 7 lb    Reps 10-15      Interval Training   Interval Training No      Treadmill   MPH 1.7    Grade 0    Minutes 15    METs 2.3      NuStep   Level 5    Minutes 15    METs 2      REL-XR   Level 2    Minutes 15    METs 3.5      Home Exercise Plan   Plans to continue exercise at Home (comment)   Rec. Elliptical   Frequency Add 2 additional days to program exercise sessions.    Initial Home Exercises Provided 10/04/23      Oxygen   Maintain Oxygen Saturation 88% or higher             Nutrition:  Target Goals: Understanding of nutrition guidelines, daily intake of sodium 1500mg , cholesterol 200mg , calories  30% from fat and 7% or less from saturated fats, daily to have 5 or more servings of fruits and vegetables.  Education: All About Nutrition: -Group instruction provided by verbal, written material, interactive activities, discussions, models, and posters to present general guidelines for heart healthy nutrition including fat, fiber, MyPlate, the role of sodium in heart healthy nutrition, utilization of the nutrition label, and utilization of this knowledge for meal planning. Follow up email sent as well. Written material given at graduation.   Biometrics:  Pre Biometrics - 08/26/23 0911       Pre Biometrics   Height 5\' 10"  (1.778 m)    Weight 213 lb 11.2 oz (96.9 kg)    Waist Circumference 44 inches    Hip Circumference 42 inches    Waist to Hip Ratio 1.05 %    BMI (Calculated) 30.66    Single Leg Stand 8.9 seconds              Nutrition Therapy Plan and Nutrition Goals:  Nutrition Therapy & Goals - 08/26/23 1106       Nutrition Therapy   RD appointment deferred Yes      Intervention Plan   Intervention Prescribe, educate and counsel regarding individualized specific dietary modifications aiming  towards targeted core components such as weight, hypertension, lipid management, diabetes, heart failure and other comorbidities.    Expected Outcomes Short Term Goal: Understand basic principles of dietary content, such as calories, fat, sodium, cholesterol and nutrients.;Short Term Goal: A plan has been developed with personal nutrition goals set during dietitian appointment.;Long Term Goal: Adherence to prescribed nutrition plan.             Nutrition Assessments:  MEDIFICTS Score Key: >=70 Need to make dietary changes  40-70 Heart Healthy Diet <= 40 Therapeutic Level Cholesterol Diet  Flowsheet Row Pulmonary Rehab from 08/26/2023 in [facility]  Picture Your Plate Total Score on Admission 52      Picture Your Plate Scores: <40 Unhealthy dietary  pattern with much room for improvement. 41-50 Dietary pattern unlikely to meet recommendations for good health and room for improvement. 51-60 More healthful dietary pattern, with some room for improvement.  >60 Healthy dietary pattern, although there may be some specific behaviors that could be improved.   Nutrition Goals Re-Evaluation:  Nutrition Goals Re-Evaluation     Row Name 09/06/23 1720 10/04/23 1743           Goals   Nutrition Goal -- Patient continues to defer RD apt. He already meets with an RD at the [place].      Comment Patient was informed on why it is important to maintain a balanced diet when dealing with Respiratory issues. Explained that it takes a lot of energy to breath and when they are short of breath often they will need to have a good diet to help keep up with the calories they are expending for breathing. --      Expected Outcome Short: Choose and plan snacks accordingly to patients caloric intake to improve breathing. Long: Maintain a diet independently that meets their caloric intake to aid in daily shortness of breath. --               Nutrition Goals Discharge (Final Nutrition Goals Re-Evaluation):  Nutrition Goals Re-Evaluation - 10/04/23 1743       Goals   Nutrition Goal Patient continues to defer RD apt. He already meets with an RD at the [place].             Psychosocial: Target Goals: Acknowledge presence or absence of significant depression and/or stress, maximize coping skills, provide positive support system. Participant is able to verbalize types and ability to use techniques and skills needed for reducing stress and depression.   Education: Stress, Anxiety, and Depression - Group verbal and visual presentation to define topics covered.  Reviews how body is impacted by stress, anxiety, and depression.  Also discusses healthy ways to reduce stress and to treat/manage anxiety and depression.  Written material given at graduation.   Education:  Sleep Hygiene -Provides group verbal and written instruction about how sleep can affect your health.  Define sleep hygiene, discuss sleep cycles and impact of sleep habits. Review good sleep hygiene tips.    Initial Review & Psychosocial Screening:  Initial Psych Review & Screening - 08/25/23 1148       Initial Review   Current issues with Current Stress Concerns;Current Depression;Current Sleep Concerns    Source of Stress Concerns Chronic Illness      Family Dynamics   Good Support System? Yes   local family and friends     Barriers   Psychosocial barriers to participate in program There are no identifiable barriers or psychosocial needs.;The  patient should benefit from training in stress management and relaxation.      Screening Interventions   Interventions Provide feedback about the scores to participant;To provide support and resources with identified psychosocial needs;Encouraged to exercise    Expected Outcomes Short Term goal: Utilizing psychosocial counselor, staff and physician to assist with identification of specific Stressors or current issues interfering with healing process. Setting desired goal for each stressor or current issue identified.;Long Term Goal: Stressors or current issues are controlled or eliminated.;Short Term goal: Identification and review with participant of any Quality of Life or Depression concerns found by scoring the questionnaire.;Long Term goal: The participant improves quality of Life and PHQ9 Scores as seen by post scores and/or verbalization of changes             Quality of Life Scores:  Scores of 19 and below usually indicate a poorer quality of life in these areas.  A difference of  2-3 points is a clinically meaningful difference.  A difference of 2-3 points in the total score of the Quality of Life Index has been associated with significant improvement in overall quality of life, self-image, physical symptoms, and general health in studies  assessing change in quality of life.  PHQ-9: Review Flowsheet  More data exists      10/04/2023 08/26/2023 07/01/2021 06/03/2021 04/29/2021  Depression screen PHQ 2/9  Decreased Interest 0 2 1 2 2   Down, Depressed, Hopeless 1 2 0 1 2  PHQ - 2 Score 1 4 1 3 4   Altered sleeping 3 3 3 3 3   Tired, decreased energy 2 3 3 1 2   Change in appetite 1 2 1 2 2   Feeling bad or failure about yourself  0 1 0 0 0  Trouble concentrating 1 2 1 2 2   Moving slowly or fidgety/restless 0 0 0 0 0  Suicidal thoughts 0 2 0 0 0  PHQ-9 Score 8 17 9 11 13   Difficult doing work/chores Somewhat difficult Somewhat difficult Somewhat difficult Somewhat difficult Not difficult at all   Interpretation of Total Score  Total Score Depression Severity:  1-4 = Minimal depression, 5-9 = Mild depression, 10-14 = Moderate depression, 15-19 = Moderately severe depression, 20-27 = Severe depression   Psychosocial Evaluation and Intervention:  Psychosocial Evaluation - 08/25/23 1150       Psychosocial Evaluation & Interventions   Interventions Encouraged to exercise with the program and follow exercise prescription;Stress management education;Relaxation education    Comments Christiano is returning to pulmonary rehab. He did the program a couple years ago and really enjoyed it, so he is looking forward to attending again. His breathing has gotten worse recently, which is altering his difficult sleep patterns even more. He has had sleep concerns for years and notes he is waking more frequently. He states he recently had a sleep study and was told he needs a cpap, but nothing has been given to him. He sees the [place] next week and plans to ask about a cpap. He has a history of PTSD which he states he feels is being managed throught [place], support from his family and friends, and setting personal goals. He admits to restarting smoking after 18 months of abstaining and thinks it was his depression that led him to restarting. However, in November  he made the decision to quit again and really wants to stick to staying away from smoking. He wants to come to the program to work on his stamina, balance, and breathing  Expected Outcomes Short: attend pulmonary rehab for education and exercise. Long: develop and maintain positive self care habits.    Continue Psychosocial Services  Follow up required by staff             Psychosocial Re-Evaluation:  Psychosocial Re-Evaluation     Row Name 10/04/23 1751 10/04/23 1752           Psychosocial Re-Evaluation   Current issues with Current Depression;Current Sleep Concerns;History of Depression;Current Stress Concerns;Current Psychotropic Meds Current Depression;Current Sleep Concerns;History of Depression;Current Stress Concerns;Current Psychotropic Meds      Comments -- Mahmood's PHQ9 was reevaluated and his score went down from 17 to 8. He states he is on treatment for depression and it is managed.      Expected Outcomes -- Short: continue to monitor mental health and notify doctor is there are any changes. Long: maintain good mental health habits.      Interventions -- Encouraged to attend Pulmonary Rehabilitation for the exercise      Continue Psychosocial Services  -- Follow up required by staff               Psychosocial Discharge (Final Psychosocial Re-Evaluation):  Psychosocial Re-Evaluation - 10/04/23 1752       Psychosocial Re-Evaluation   Current issues with Current Depression;Current Sleep Concerns;History of Depression;Current Stress Concerns;Current Psychotropic Meds    Comments Josean's PHQ9 was reevaluated and his score went down from 17 to 8. He states he is on treatment for depression and it is managed.    Expected Outcomes Short: continue to monitor mental health and notify doctor is there are any changes. Long: maintain good mental health habits.    Interventions Encouraged to attend Pulmonary Rehabilitation for the exercise    Continue Psychosocial Services   Follow up required by staff             Education: Education Goals: Education classes will be provided on a weekly basis, covering required topics. Participant will state understanding/return demonstration of topics presented.  Learning Barriers/Preferences:  Learning Barriers/Preferences - 08/25/23 1148       Learning Barriers/Preferences   Learning Barriers None    Learning Preferences None             General Pulmonary Education Topics:  Infection Prevention: - Provides verbal and written material to individual with discussion of infection control including proper hand washing and proper equipment cleaning during exercise session. Flowsheet Row Pulmonary Rehab from 08/26/2023 in [facility] Cardiac and Pulmonary Rehab  Date 08/26/23  Educator NT  Instruction Review Code 1- Verbalizes Understanding       Falls Prevention: - Provides verbal and written material to individual with discussion of falls prevention and safety. Flowsheet Row Pulmonary Rehab from 08/26/2023 in [facility] Cardiac and Pulmonary Rehab  Date 08/26/23  Educator NT  Instruction Review Code 1- Verbalizes Understanding       Chronic Lung Disease Review: - Group verbal instruction with posters, models, PowerPoint presentations and videos,  to review new updates, new respiratory medications, new advancements in procedures and treatments. Providing information on websites and "800" numbers for continued self-education. Includes information about supplement oxygen, available portable oxygen systems, continuous and intermittent flow rates, oxygen safety, concentrators, and Medicare reimbursement for oxygen. Explanation of Pulmonary Drugs, including class, frequency, complications, importance of spacers, rinsing mouth after steroid MDI's, and proper cleaning methods for nebulizers. Review of basic lung anatomy and physiology related to function, structure, and complications of lung disease. Review of risk factors.  Discussion about methods for diagnosing sleep apnea and types of masks and machines for OSA. Includes a review of the use of types of environmental controls: home humidity, furnaces, filters, dust mite/pet prevention, HEPA vacuums. Discussion about weather changes, air quality and the benefits of nasal washing. Instruction on Warning signs, infection symptoms, calling MD promptly, preventive modes, and value of vaccinations. Review of effective airway clearance, coughing and/or vibration techniques. Emphasizing that all should Create an Action Plan. Written material given at graduation. Flowsheet Row Pulmonary Rehab from 08/26/2023 in [facility] Cardiac and Pulmonary Rehab  Education need identified 08/26/23       AED/CPR: - Group verbal and written instruction with the use of models to demonstrate the basic use of the AED with the basic ABC's of resuscitation.    Anatomy and Cardiac Procedures: - Group verbal and visual presentation and models provide information about basic cardiac anatomy and function. Reviews the testing methods done to diagnose heart disease and the outcomes of the test results. Describes the treatment choices: Medical Management, Angioplasty, or Coronary Bypass Surgery for treating various heart conditions including Myocardial Infarction, Angina, Valve Disease, and Cardiac Arrhythmias.  Written material given at graduation. Flowsheet Row Pulmonary Rehab from 04/17/2021 in [facility] Cardiac and Pulmonary Rehab  Date 04/17/21  Educator KB  Instruction Review Code 1- Verbalizes Understanding       Medication Safety: - Group verbal and visual instruction to review commonly prescribed medications for heart and lung disease. Reviews the medication, class of the drug, and side effects. Includes the steps to properly store meds and maintain the prescription regimen.  Written material given at graduation. Flowsheet Row Pulmonary Rehab from 08/26/2023 in [facility] Cardiac and Pulmonary Rehab   Education need identified 08/26/23       Other: -Provides group and verbal instruction on various topics (see comments)   Knowledge Questionnaire Score:  Knowledge Questionnaire Score - 08/26/23 1104       Knowledge Questionnaire Score   Pre Score 15/18              Core Components/Risk Factors/Patient Goals at Admission:  Personal Goals and Risk Factors at Admission - 08/25/23 1138       Core Components/Risk Factors/Patient Goals on Admission   Tobacco Cessation Yes   Quit November 2024   Intervention Offer Photographer, assist with locating and accessing local/national Quit Smoking programs, and support quit date choice.;Assist the participant in steps to quit. Provide individualized education and counseling about committing to Tobacco Cessation, relapse prevention, and pharmacological support that can be provided by physician.    Expected Outcomes Short Term: Will quit all tobacco product use, adhering to prevention of relapse plan.;Long Term: Complete abstinence from all tobacco products for at least 12 months from quit date.    Improve shortness of breath with ADL's Yes    Intervention Provide education, individualized exercise plan and daily activity instruction to help decrease symptoms of SOB with activities of daily living.    Expected Outcomes Short Term: Improve cardiorespiratory fitness to achieve a reduction of symptoms when performing ADLs;Long Term: Be able to perform more ADLs without symptoms or delay the onset of symptoms    Diabetes Yes    Intervention Provide education about signs/symptoms and action to take for hypo/hyperglycemia.;Provide education about proper nutrition, including hydration, and aerobic/resistive exercise prescription along with prescribed medications to achieve blood glucose in normal ranges: Fasting glucose 65-99 mg/dL    Expected Outcomes Short Term: Participant verbalizes understanding of the signs/symptoms and  immediate care  of hyper/hypoglycemia, proper foot care and importance of medication, aerobic/resistive exercise and nutrition plan for blood glucose control.;Long Term: Attainment of HbA1C < 7%.    Hypertension Yes    Intervention Provide education on lifestyle modifcations including regular physical activity/exercise, weight management, moderate sodium restriction and increased consumption of fresh fruit, vegetables, and low fat dairy, alcohol moderation, and smoking cessation.;Monitor prescription use compliance.    Expected Outcomes Short Term: Continued assessment and intervention until BP is < 140/25mm HG in hypertensive participants. < 130/62mm HG in hypertensive participants with diabetes, heart failure or chronic kidney disease.;Long Term: Maintenance of blood pressure at goal levels.    Lipids Yes    Intervention Provide education and support for participant on nutrition & aerobic/resistive exercise along with prescribed medications to achieve LDL 70mg , HDL >40mg .    Expected Outcomes Short Term: Participant states understanding of desired cholesterol values and is compliant with medications prescribed. Participant is following exercise prescription and nutrition guidelines.;Long Term: Cholesterol controlled with medications as prescribed, with individualized exercise RX and with personalized nutrition plan. Value goals: LDL < 70mg , HDL > 40 mg.             Education:Diabetes - Individual verbal and written instruction to review signs/symptoms of diabetes, desired ranges of glucose level fasting, after meals and with exercise. Acknowledge that pre and post exercise glucose checks will be done for 3 sessions at entry of program. Flowsheet Row Pulmonary Rehab from 08/26/2023 in [facility] Cardiac and Pulmonary Rehab  Date 08/25/23  Educator [facility]  Instruction Review Code 1- Verbalizes Understanding       Know Your Numbers and Heart Failure: - Group verbal and visual instruction to discuss disease risk factors  for cardiac and pulmonary disease and treatment options.  Reviews associated critical values for Overweight/Obesity, Hypertension, Cholesterol, and Diabetes.  Discusses basics of heart failure: signs/symptoms and treatments.  Introduces Heart Failure Zone chart for action plan for heart failure.  Written material given at graduation.   Core Components/Risk Factors/Patient Goals Review:   Goals and Risk Factor Review     Row Name 09/06/23 1720 10/04/23 1748           Core Components/Risk Factors/Patient Goals Review   Personal Goals Review Improve shortness of breath with ADL's Hypertension;Diabetes;Improve shortness of breath with ADL's      Review Spoke to patient about their shortness of breath and what they can do to improve. Patient has been informed of breathing techniques when starting the program. Patient is informed to tell staff if they have had any med changes and that certain meds they are taking or not taking can be causing shortness of breath. Patient reports he is not dealing with much SOB anymore and is doing well with that. He does occationally check his BP at home and does own a cuff. He was encouraged to become more routine in monitoring is BP at home. He stated he has all the supplies he needs to check his blood sugar but needs to get in a better habit of monitoring it more regularly. He plans to start checking it once a day.      Expected Outcomes Short: Attend [facility] regularly to improve shortness of breath with ADL's. Long: maintain independence with ADL's Short: start checking blood sugar every day. Long: independently monitor BP and blood sugar regularly and consistently.               Core Components/Risk Factors/Patient Goals at Discharge (Final Review):  Goals and Risk Factor Review - 10/04/23 1748       Core Components/Risk Factors/Patient Goals Review   Personal Goals Review Hypertension;Diabetes;Improve shortness of breath with ADL's    Review Patient  reports he is not dealing with much SOB anymore and is doing well with that. He does occationally check his BP at home and does own a cuff. He was encouraged to become more routine in monitoring is BP at home. He stated he has all the supplies he needs to check his blood sugar but needs to get in a better habit of monitoring it more regularly. He plans to start checking it once a day.    Expected Outcomes Short: start checking blood sugar every day. Long: independently monitor BP and blood sugar regularly and consistently.             ITP Comments:  ITP Comments     Row Name 08/25/23 1144 08/26/23 0908 08/30/23 1728 09/01/23 1316 09/29/23 0838   ITP Comments Initial phone call completed. Diagnosis can be found in [facility] 11/20. EP Orientation scheduled for Thursday 1/9 at 8am. Completed and gym orientation. Initial ITP created and sent for review to Dr. Faud Aleskerov, Medical Director. First full day of exercise!  Patient was oriented to gym and equipment including functions, settings, policies, and procedures.  Patient's individual exercise prescription and treatment plan were reviewed.  All starting workloads were established based on the results of the 6 minute walk test done at initial orientation visit.  The plan for exercise progression was also introduced and progression will be customized based on patient's performance and goals. 30 Day review completed. Medical Director ITP review done, changes made as directed, and signed approval by Medical Director. 30 Day review completed. Medical Director ITP review done, changes made as directed, and signed approval by Medical Director.    Row Name 10/20/23 1623 10/27/23 0847 11/24/23 1128 12/22/23 1306     ITP Comments Patient states he has a bulging disc and has had an MRI. His doctor suggested he waits to resume rehab until he has talked to a Careers adviser. Informed patient that he will be put on a medical hold and to call back when he is able to speak  with the surgeon. 30 Day review completed. Medical Director ITP review done, changes made as directed, and signed approval by Medical Director. 30 Day review completed. Medical Director ITP review done, changes made as directed, and signed approval by Medical Director. 30 Day review completed. Medical Director ITP review done, changes made as directed, and signed approval by Medical Director.             Comments: 30 day review

## 2024-01-04 ENCOUNTER — Telehealth: Payer: Self-pay

## 2024-01-04 NOTE — Telephone Encounter (Signed)
 We called pt today as he has not attended rehab since 10/13/2023 due to back pain. He stated that he was seeing a neurosurgeon and was incapable of exercising in rehab at this time. Pt agreed to discharging from the program.

## 2024-01-05 ENCOUNTER — Encounter: Payer: Self-pay | Admitting: *Deleted

## 2024-01-05 DIAGNOSIS — J449 Chronic obstructive pulmonary disease, unspecified: Secondary | ICD-10-CM

## 2024-01-05 NOTE — Progress Notes (Signed)
 Early Discharge Summary   [name] DOB: [birth year]  We called [name] today as he has not attended rehab since 10/13/2023 due to back pain. He stated that he was seeing a neurosurgeon and was incapable of exercising in rehab at this time. He agreed to discharging from the program. He completed 13/36 sessions.    6 Minute Walk     Row Name 08/26/23 0908         6 Minute Walk   Phase Initial     Distance 1340 feet     Walk Time 6 minutes     # of Rest Breaks 0     MPH 2.54     METS 2.49     RPE 13     Perceived Dyspnea  3     VO2 Peak 8.71     Symptoms Yes (comment)     Comments leg weakness     Resting HR 58 bpm     Resting BP 124/62     Resting Oxygen Saturation  94 %     Exercise Oxygen Saturation  during 6 min walk 88 %     Max Ex. HR 91 bpm     Max Ex. BP 136/64     2 Minute Post BP 126/58       Interval HR   1 Minute HR 63     2 Minute HR 73     3 Minute HR 70     4 Minute HR 78     5 Minute HR 81     6 Minute HR 91     2 Minute Post HR 63     Interval Heart Rate? Yes       Interval Oxygen   Interval Oxygen? Yes     Baseline Oxygen Saturation % 94 %     1 Minute Oxygen Saturation % 92 %     1 Minute Liters of Oxygen 0 L  RA     2 Minute Oxygen Saturation % 90 %     2 Minute Liters of Oxygen 0 L     3 Minute Oxygen Saturation % 88 %     3 Minute Liters of Oxygen 0 L     4 Minute Oxygen Saturation % 90 %     4 Minute Liters of Oxygen 0 L     5 Minute Oxygen Saturation % 89 %     5 Minute Liters of Oxygen 0 L     6 Minute Oxygen Saturation % 88 %     6 Minute Liters of Oxygen 0 L     2 Minute Post Oxygen Saturation % 96 %     2 Minute Post Liters of Oxygen 0 L

## 2024-01-05 NOTE — Progress Notes (Signed)
 Pulmonary Individual Treatment Plan  Patient Details  Name: Robert Combs MRN: [id number] Date of Birth: May 03, 1946 Referring Provider:   Flowsheet Row Pulmonary Rehab from 08/26/2023 in [facility] Cardiac and Pulmonary Rehab  Referring Provider Dr. Artie Laster       Initial Encounter Date:  Flowsheet Row Pulmonary Rehab from 08/26/2023 in [facility] Cardiac and Pulmonary Rehab  Date 08/26/23       Visit Diagnosis: Chronic obstructive pulmonary disease, unspecified COPD type (HCC)  Patient's Home Medications on Admission:  Current Outpatient Medications:    albuterol (PROVENTIL HFA;VENTOLIN HFA) 108 (90 Base) MCG/ACT inhaler, Inhale 1 puff into the lungs every 6 (six) hours as needed for wheezing or shortness of breath., Disp: , Rfl:    albuterol (VENTOLIN HFA) 108 (90 Base) MCG/ACT inhaler, Inhale into the lungs., Disp: , Rfl:    Alogliptin Benzoate 25 MG TABS, Take by mouth., Disp: , Rfl:    aspirin EC 81 MG tablet, Take 81 mg by mouth daily.  , Disp: , Rfl:    atorvastatin (LIPITOR) 40 MG tablet, Take 40 mg by mouth daily., Disp: , Rfl:    azelastine  (ASTELIN ) 0.1 % nasal spray, Place 2 sprays into both nostrils 2 (two) times daily as needed for rhinitis. Use in each nostril as directed, Disp: 30 mL, Rfl: 5   cholecalciferol (VITAMIN D) 1000 UNITS tablet, Take 2,000 Units by mouth daily., Disp: , Rfl:    fluticasone  (FLONASE ) 50 MCG/ACT nasal spray, Place 2 sprays into both nostrils daily., Disp: 16 g, Rfl: 5   folic acid (FOLVITE) 1 MG tablet, Take 1 mg by mouth daily., Disp: , Rfl:    gabapentin (NEURONTIN) 300 MG capsule, Take 300 mg by mouth.  TAKE ONE CAPSULE BY MOUTH EVERY MORNING AND TAKE ONE CAPSULE AT NOON AND TAKE TWO CAPSULES EVERY EVENING FOR NERVE PAIN/BURNING, Disp: , Rfl:    hydrochlorothiazide (HYDRODIURIL) 25 MG tablet, Take 1 tablet by mouth every morning., Disp: , Rfl:    losartan (COZAAR) 100 MG tablet, Take 100 mg by mouth daily., Disp: , Rfl:    Magnesium Oxide 420 MG  TABS, Take 420 mg by mouth daily., Disp: , Rfl:    vardenafil (LEVITRA) 20 MG tablet, Take 10 mg by mouth., Disp: , Rfl:   Past Medical History: Past Medical History:  Diagnosis Date   Chronic kidney disease    COPD (chronic obstructive pulmonary disease) (HCC)    Coronary artery disease    DDD (degenerative disc disease)    Diabetes mellitus without complication (HCC)    Hyperlipidemia    Hypertension     Tobacco Use: Social History   Tobacco Use  Smoking Status Every Day   Types: Cigarettes   Passive exposure: Current  Smokeless Tobacco Never  Tobacco Comments   Started back in January 2025    Labs: Review Flowsheet       Latest Ref Rng & Units 02/03/2010 11/04/2015  Labs for ITP Cardiac and Pulmonary Rehab  Bicarbonate 20.0 - 24.0 mEq/L - 26.3   TCO2 0 - 100 mmol/L 31  28   O2 Saturation % - 75.0      Pulmonary Assessment Scores:  Pulmonary Assessment Scores     Row Name 08/26/23 1105         ADL UCSD   ADL Phase Entry     SOB Score total 55     Rest 0     Walk 0     Stairs 5  Bath 0     Dress 0     Shop 0       CAT Score   CAT Score 26       mMRC Score   mMRC Score 1              UCSD: Self-administered rating of dyspnea associated with activities of daily living (ADLs) 6-point scale (0 = "not at all" to 5 = "maximal or unable to do because of breathlessness")  Scoring Scores range from 0 to 120.  Minimally important difference is 5 units  CAT: CAT can identify the health impairment of COPD patients and is better correlated with disease progression.  CAT has a scoring range of zero to 40. The CAT score is classified into four groups of low (less than 10), medium (10 - 20), high (21-30) and very high (31-40) based on the impact level of disease on health status. A CAT score over 10 suggests significant symptoms.  A worsening CAT score could be explained by an exacerbation, poor medication adherence, poor inhaler technique, or progression  of COPD or comorbid conditions.  CAT MCID is 2 points  mMRC: mMRC (Modified Medical Research Council) Dyspnea Scale is used to assess the degree of baseline functional disability in patients of respiratory disease due to dyspnea. No minimal important difference is established. A decrease in score of 1 point or greater is considered a positive change.   Pulmonary Function Assessment:   Exercise Target Goals: Exercise Program Goal: Individual exercise prescription set using results from initial 6 min walk test and THRR while considering  patient's activity barriers and safety.   Exercise Prescription Goal: Initial exercise prescription builds to 30-45 minutes a day of aerobic activity, 2-3 days per week.  Home exercise guidelines will be given to patient during program as part of exercise prescription that the participant will acknowledge.  Education: Aerobic Exercise: - Group verbal and visual presentation on the components of exercise prescription. Introduces F.I.T.T principle from ACSM for exercise prescriptions.  Reviews F.I.T.T. principles of aerobic exercise including progression. Written material given at graduation. Flowsheet Row Pulmonary Rehab from 04/17/2021 in [facility] Cardiac and Pulmonary Rehab  Date 02/06/21  Educator [facility]  Instruction Review Code 1- Verbalizes Understanding       Education: Resistance Exercise: - Group verbal and visual presentation on the components of exercise prescription. Introduces F.I.T.T principle from ACSM for exercise prescriptions  Reviews F.I.T.T. principles of resistance exercise including progression. Written material given at graduation. Flowsheet Row Pulmonary Rehab from 04/17/2021 in [facility] Cardiac and Pulmonary Rehab  Date 04/17/21  Educator [facility]  Instruction Review Code 1- Verbalizes Understanding        Education: Exercise & Equipment Safety: - Individual verbal instruction and demonstration of equipment use and safety with use of the  equipment. Flowsheet Row Pulmonary Rehab from 08/26/2023 in [facility] Cardiac and Pulmonary Rehab  Date 08/26/23  Educator [facility]  Instruction Review Code 1- Verbalizes Understanding       Education: Exercise Physiology & General Exercise Guidelines: - Group verbal and written instruction with models to review the exercise physiology of the cardiovascular system and associated critical values. Provides general exercise guidelines with specific guidelines to those with heart or lung disease.    Education: Flexibility, Balance, Mind/Body Relaxation: - Group verbal and visual presentation with interactive activity on the components of exercise prescription. Introduces F.I.T.T principle from ACSM for exercise prescriptions. Reviews F.I.T.T. principles of flexibility and balance exercise training including progression. Also discusses the  mind body connection.  Reviews various relaxation techniques to help reduce and manage stress (i.e. Deep breathing, progressive muscle relaxation, and visualization). Balance handout provided to take home. Written material given at graduation. Flowsheet Row Pulmonary Rehab from 04/17/2021 in [facility] Cardiac and Pulmonary Rehab  Date 02/20/21  Educator [facility]  Instruction Review Code 1- Verbalizes Understanding       Activity Barriers & Risk Stratification:  Activity Barriers & Cardiac Risk Stratification - 08/25/23 1135       Activity Barriers & Cardiac Risk Stratification   Activity Barriers Shortness of Breath;Deconditioning;Muscular Weakness;Balance Concerns   neuropathy (right leg worse)            6 Minute Walk:  6 Minute Walk     Row Name 08/26/23 0908         6 Minute Walk   Phase Initial     Distance 1340 feet     Walk Time 6 minutes     # of Rest Breaks 0     MPH 2.54     METS 2.49     RPE 13     Perceived Dyspnea  3     VO2 Peak 8.71     Symptoms Yes (comment)     Comments leg weakness     Resting HR 58 bpm     Resting BP 124/62     Resting  Oxygen Saturation  94 %     Exercise Oxygen Saturation  during 6 min walk 88 %     Max Ex. HR 91 bpm     Max Ex. BP 136/64     2 Minute Post BP 126/58       Interval HR   1 Minute HR 63     2 Minute HR 73     3 Minute HR 70     4 Minute HR 78     5 Minute HR 81     6 Minute HR 91     2 Minute Post HR 63     Interval Heart Rate? Yes       Interval Oxygen   Interval Oxygen? Yes     Baseline Oxygen Saturation % 94 %     1 Minute Oxygen Saturation % 92 %     1 Minute Liters of Oxygen 0 L  RA     2 Minute Oxygen Saturation % 90 %     2 Minute Liters of Oxygen 0 L     3 Minute Oxygen Saturation % 88 %     3 Minute Liters of Oxygen 0 L     4 Minute Oxygen Saturation % 90 %     4 Minute Liters of Oxygen 0 L     5 Minute Oxygen Saturation % 89 %     5 Minute Liters of Oxygen 0 L     6 Minute Oxygen Saturation % 88 %     6 Minute Liters of Oxygen 0 L     2 Minute Post Oxygen Saturation % 96 %     2 Minute Post Liters of Oxygen 0 L             Oxygen Initial Assessment:  Oxygen Initial Assessment - 08/25/23 1148       Home Oxygen   Home Oxygen Device None    Sleep Oxygen Prescription None    Home Exercise Oxygen Prescription None    Home Resting Oxygen Prescription None    Compliance with  Home Oxygen Use Yes      Intervention   Short Term Goals To learn and understand importance of monitoring SPO2 with pulse oximeter and demonstrate accurate use of the pulse oximeter.;To learn and understand importance of maintaining oxygen saturations>88%;To learn and demonstrate proper pursed lip breathing techniques or other breathing techniques. ;To learn and demonstrate proper use of respiratory medications    Long  Term Goals Verbalizes importance of monitoring SPO2 with pulse oximeter and return demonstration;Maintenance of O2 saturations>88%;Exhibits proper breathing techniques, such as pursed lip breathing or other method taught during program session;Compliance with respiratory  medication             Oxygen Re-Evaluation:  Oxygen Re-Evaluation     Row Name 08/30/23 1729 09/06/23 1712 10/04/23 1739         Program Oxygen Prescription   Program Oxygen Prescription -- None None       Home Oxygen   Home Oxygen Device -- None None     Sleep Oxygen Prescription -- None None     Home Exercise Oxygen Prescription -- None None     Home Resting Oxygen Prescription -- None None     Compliance with Home Oxygen Use -- -- Yes       Goals/Expected Outcomes   Short Term Goals -- To learn and demonstrate proper pursed lip breathing techniques or other breathing techniques.  To learn and demonstrate proper pursed lip breathing techniques or other breathing techniques. ;To learn and understand importance of maintaining oxygen saturations>88%;To learn and understand importance of monitoring SPO2 with pulse oximeter and demonstrate accurate use of the pulse oximeter.     Long  Term Goals -- Exhibits proper breathing techniques, such as pursed lip breathing or other method taught during program session Maintenance of O2 saturations>88%;Verbalizes importance of monitoring SPO2 with pulse oximeter and return demonstration;Exhibits proper breathing techniques, such as pursed lip breathing or other method taught during program session     Comments Reviewed PLB technique with pt.  Talked about how it works and it's importance in maintaining their exercise saturations. Informed patient how to perform the Pursed Lipped breathing technique. Told patient to Inhale through the nose and out the mouth with pursed lips to keep their airways open, help oxygenate them better, practice when at rest or doing strenuous activity. Patient Verbalizes understanding of technique and will work on and be reiterated during [facility]. Andrea does not currently own a pulse oxymeter. He was encouraged to get one to be able to monitor his heart rate and oxygen saturations at home, specifically during home  exercise. [facility] one. He does state that he uses PLB techniques to help control SOB and improve SaO2 numbers.     Goals/Expected Outcomes Short: Become more profiecient at using PLB.   Long: Become independent at using PLB. Short: use PLB with exertion. Long: use PLB on exertion proficiently and independently. Short: talk to [place] about getting a pulse oxymeter. Long: monitor SaO2 and use PLB independently to control SOB              Oxygen Discharge (Final Oxygen Re-Evaluation):  Oxygen Re-Evaluation - 10/04/23 1739       Program Oxygen Prescription   Program Oxygen Prescription None      Home Oxygen   Home Oxygen Device None    Sleep Oxygen Prescription None    Home Exercise Oxygen Prescription None    Home Resting Oxygen  Prescription None    Compliance with Home Oxygen Use Yes      Goals/Expected Outcomes   Short Term Goals To learn and demonstrate proper pursed lip breathing techniques or other breathing techniques. ;To learn and understand importance of maintaining oxygen saturations>88%;To learn and understand importance of monitoring SPO2 with pulse oximeter and demonstrate accurate use of the pulse oximeter.    Long  Term Goals Maintenance of O2 saturations>88%;Verbalizes importance of monitoring SPO2 with pulse oximeter and return demonstration;Exhibits proper breathing techniques, such as pursed lip breathing or other method taught during program session    Comments Rohith does not currently own a pulse oxymeter. He was encouraged to get one to be able to monitor his heart rate and oxygen saturations at home, specifically during home exercise. He [facility] one. He does state that he uses PLB techniques to help control SOB and improve SaO2 numbers.    Goals/Expected Outcomes Short: talk to [place] about getting a pulse oxymeter. Long: monitor SaO2 and use PLB independently to control SOB              Initial Exercise Prescription:  Initial Exercise Prescription - 08/26/23 1100       Date of Initial Exercise RX and Referring Provider   Date 08/26/23    Referring Provider Dr. Artie Laster      Oxygen   Maintain Oxygen Saturation 88% or higher      Treadmill   MPH 2    Grade 0    Minutes 15    METs 2.53      NuStep   Level 2    SPM 80    Minutes 15    METs 2.49      REL-XR   Level 2    Speed 50    Minutes 15    METs 2.49      Prescription Details   Frequency (times per week) 3    Duration Progress to 30 minutes of continuous aerobic without signs/symptoms of physical distress      Intensity   THRR 40-80% of Max Heartrate 92-127    Ratings of Perceived Exertion 11-13    Perceived Dyspnea 0-4      Progression   Progression Continue to progress workloads to maintain intensity without signs/symptoms of physical distress.      Resistance Training   Training Prescription Yes    Weight 7 lb    Reps 10-15             Perform Capillary Blood Glucose checks as needed.  Exercise Prescription Changes:   Exercise Prescription Changes     Row Name 08/26/23 1100 09/16/23 1200 10/04/23 1700 10/14/23 1500 10/28/23 1100     Response to Exercise   Blood Pressure (Admit) 124/62 142/70 -- 148/87 122/60   Blood Pressure (Exercise) 136/64 156/82 -- 152/82 --   Blood Pressure (Exit) 126/58 138/70 -- 118/70 136/82   Heart Rate (Admit) 58 bpm 63 bpm -- 68 bpm 67 bpm   Heart Rate (Exercise) 91 bpm 103 bpm -- 103 bpm 100 bpm   Heart Rate (Exit) 63 bpm 70 bpm -- 74 bpm 78 bpm   Oxygen Saturation (Admit) 94 % 93 % -- 91 % 90 %   Oxygen Saturation (Exercise) 88 % 90 % -- 88 % 88 %   Oxygen Saturation (Exit) 96 % 94 % -- 91 % 92 %   Rating of Perceived Exertion (Exercise)  13 13 -- 13 13   Perceived Dyspnea (Exercise) 3 2 -- 1 1   Symptoms leg weakness none -- none none   Comments Results First 2 weeks of exercise -- -- --   Duration -- Progress to 30  minutes of  aerobic without signs/symptoms of physical distress Progress to 30 minutes of  aerobic without signs/symptoms of physical distress Continue with 30 min of aerobic exercise without signs/symptoms of physical distress. Continue with 30 min of aerobic exercise without signs/symptoms of physical distress.   Intensity -- THRR unchanged THRR unchanged THRR unchanged THRR unchanged     Progression   Progression -- Continue to progress workloads to maintain intensity without signs/symptoms of physical distress. Continue to progress workloads to maintain intensity without signs/symptoms of physical distress. Continue to progress workloads to maintain intensity without signs/symptoms of physical distress. Continue to progress workloads to maintain intensity without signs/symptoms of physical distress.   Average METs -- 2.76 2.76 2.67 2.53     Resistance Training   Training Prescription -- Yes Yes Yes Yes   Weight -- 7lb 7lb 7 lb 7 lb   Reps -- 10-15 10-15 10-15 10-15     Interval Training   Interval Training -- No No No No     Treadmill   MPH -- 3.5 3.5 2.5 1.7   Grade -- 0 0 0 0   Minutes -- 15 15 15 15    METs -- 3.68 3.68 2.91 2.3     NuStep   Level -- 5 5 5 5    Minutes -- 15 15 15 15    METs -- 2.5 2.5 2 2      REL-XR   Level -- 2 2 2 2    Minutes -- 15 15 15 15    METs -- 3 3 3.5 3.5     Home Exercise Plan   Plans to continue exercise at -- -- Home (comment)  Rec. Elliptical Home (comment)  Rec. Elliptical Home (comment)  Rec. Elliptical   Frequency -- -- Add 2 additional days to program exercise sessions. Add 2 additional days to program exercise sessions. Add 2 additional days to program exercise sessions.   Initial Home Exercises Provided -- -- 10/04/23 10/04/23 10/04/23     Oxygen   Maintain Oxygen Saturation -- 88% or higher 88% or higher 88% or higher 88% or higher            Exercise Comments:   Exercise Comments     Row Name 08/30/23 1728            Exercise Comments First full day of exercise!  Patient was oriented to gym and equipment including functions, settings, policies, and procedures.  Patient's individual exercise prescription and treatment plan were reviewed.  All starting workloads were established based on the results of the 6 minute walk test done at initial orientation visit.  The plan for exercise progression was also introduced and progression will be customized based on patient's performance and goals.                Exercise Goals and Review:   Exercise Goals     Row Name 08/26/23 0910             Exercise Goals   Increase Physical Activity Yes       Intervention Provide advice, education, support and counseling about physical activity/exercise needs.;Develop an individualized exercise prescription for aerobic and resistive training based on initial evaluation findings, risk stratification, comorbidities and participant's  personal goals.       Expected Outcomes Short Term: Attend rehab on a regular basis to increase amount of physical activity.;Long Term: Add in home exercise to make exercise part of routine and to increase amount of physical activity.;Long Term: Exercising regularly at least 3-5 days a week.       Increase Strength and Stamina Yes       Intervention Provide advice, education, support and counseling about physical activity/exercise needs.;Develop an individualized exercise prescription for aerobic and resistive training based on initial evaluation findings, risk stratification, comorbidities and participant's personal goals.       Expected Outcomes Short Term: Increase workloads from initial exercise prescription for resistance, speed, and METs.;Short Term: Perform resistance training exercises routinely during rehab and add in resistance training at home;Long Term: Improve cardiorespiratory fitness, muscular endurance and strength as measured by increased METs and functional capacity ( )       Able  to understand and use rate of perceived exertion (RPE) scale Yes       Intervention Provide education and explanation on how to use RPE scale       Expected Outcomes Short Term: Able to use RPE daily in rehab to express subjective intensity level;Long Term:  Able to use RPE to guide intensity level when exercising independently       Able to understand and use Dyspnea scale Yes       Intervention Provide education and explanation on how to use Dyspnea scale       Expected Outcomes Short Term: Able to use Dyspnea scale daily in rehab to express subjective sense of shortness of breath during exertion;Long Term: Able to use Dyspnea scale to guide intensity level when exercising independently       Knowledge and understanding of Target Heart Rate Range (THRR) Yes       Intervention Provide education and explanation of THRR including how the numbers were predicted and where they are located for reference       Expected Outcomes Short Term: Able to use daily as guideline for intensity in rehab;Short Term: Able to state/look up THRR;Long Term: Able to use THRR to govern intensity when exercising independently       Able to check pulse independently Yes       Intervention Provide education and demonstration on how to check pulse in carotid and radial arteries.;Review the importance of being able to check your own pulse for safety during independent exercise       Expected Outcomes Short Term: Able to explain why pulse checking is important during independent exercise;Long Term: Able to check pulse independently and accurately       Understanding of Exercise Prescription Yes       Intervention Provide education, explanation, and written materials on patient's individual exercise prescription       Expected Outcomes Short Term: Able to explain program exercise prescription;Long Term: Able to explain home exercise prescription to exercise independently                Exercise Goals Re-Evaluation :   Exercise Goals Re-Evaluation     Row Name 08/30/23 1728 09/16/23 1246 10/04/23 1737 10/14/23 1503 10/28/23 1147     Exercise Goal Re-Evaluation   Exercise Goals Review Able to understand and use rate of perceived exertion (RPE) scale;Increase Physical Activity;Knowledge and understanding of Target Heart Rate Range (THRR);Understanding of Exercise Prescription;Increase Strength and Stamina;Able to check pulse independently Increase Physical Activity;Increase Strength and Stamina;Understanding of Exercise Prescription Increase  Strength and Stamina;Increase Physical Activity;Able to understand and use rate of perceived exertion (RPE) scale;Able to understand and use Dyspnea scale;Knowledge and understanding of Target Heart Rate Range (THRR);Able to check pulse independently;Understanding of Exercise Prescription Increase Physical Activity;Increase Strength and Stamina;Understanding of Exercise Prescription Increase Physical Activity;Increase Strength and Stamina;Understanding of Exercise Prescription   Comments Reviewed RPE and dyspnea scale, THR and program prescription with pt today.  Pt voiced understanding and was given a copy of goals to take home. Gale is off to a good start in the program. He was able to attend his first few sessions of rehab during this review. During his sessions he was able to increase his treadmill workload from his prescribed workload of to 3.37mph. He was also able to increase his level on the T4 nustep from his prescribed level 2 to a level 5. We will continue to monitor his improvements and progress in the program. Reviewed home exercise with pt today from 5:25 pm to 5:37 pm.  Pt plans to use his recumbent elliptical at home for exercise.  Reviewed THR, pulse, RPE, sign and symptoms, pulse oximetery and when to call 911 or MD.  Also discussed weather considerations and indoor options.  Pt voiced understanding. Cullin is doing well in rehab. He consistently works at level 5 on  the T4 nustep and level 2 on the XR. He did see a decrease in his treadmill workload with hi speed decreasing to 2.5 mph with no incline. We will continue to monitor his progress in the program. Therman has only attended two sessions of rehab since the last review. He decreased his treadmill speed from 2.4 mph to 1.7 mph with no incline. He has continued to work at level 2 on the XR and level 5 on the T4 nustep. We will continue to monitor his progress in the program.   Expected Outcomes Short: Use RPE daily to regulate intensity.  Long: Follow program prescription in THR. Short: COntinue to increase treadmill workload. Long: Continue exercise to increase strength and stamina. Short: add 1-2 days a week of exercise at home on off days of pulmonary rehab. Long: maintain independent exercise routine. Short: Increase treadmill workload back up to previous level. Long: Continue exercise to improve strength and stamina. Short: Attend rehab more consistently, Increase treadmill workload back up to previous level. Long: Continue exercise to improve strength and stamina.    Row Name 11/09/23 1403 11/23/23 1351 12/09/23 1724 12/23/23 1426       Exercise Goal Re-Evaluation   Exercise Goals Review Increase Physical Activity;Increase Strength and Stamina;Understanding of Exercise Prescription Increase Physical Activity;Increase Strength and Stamina;Understanding of Exercise Prescription -- Increase Physical Activity;Increase Strength and Stamina;Understanding of Exercise Prescription    Comments Bert continues to be out on medical hold due to his back pain. He will be starting physical therapy for his back soon and will let us  know when he plans to return to pulmonary rehab. We will continue to monitor his progress when he returns. Tremel continues to be out on medical hold due to his back pain. He will be starting physical therapy for his back soon and will let us  know when he plans to return to pulmonary rehab. We  will continue to monitor his progress when he returns. Zaden continues to be out on medical hold due to his back pain. He has physical therapy for his back and will let us  know when he plans to return to pulmonary rehab. We will continue to monitor his  progress when he returns. [place] continues to be out on medical hold due to his back pain. We will contact him to see when he plans to return to rehab. We will continue to monitor his progress when he returns.    Expected Outcomes Short: Return to rehab when appropriate. Long: Graduate from the program. Short: Return to rehab when appropriate. Long: Graduate from the program. Short: Return to rehab when appropriate. Long: Graduate from the program. Short: Return to rehab when appropriate. Long: Graduate from the program.             Discharge Exercise Prescription (Final Exercise Prescription Changes):  Exercise Prescription Changes - 10/28/23 1100       Response to Exercise   Blood Pressure (Admit) 122/60    Blood Pressure (Exit) 136/82    Heart Rate (Admit) 67 bpm    Heart Rate (Exercise) 100 bpm    Heart Rate (Exit) 78 bpm    Oxygen Saturation (Admit) 90 %    Oxygen Saturation (Exercise) 88 %    Oxygen Saturation (Exit) 92 %    Rating of Perceived Exertion (Exercise) 13    Perceived Dyspnea (Exercise) 1    Symptoms none    Duration Continue with 30 min of aerobic exercise without signs/symptoms of physical distress.    Intensity THRR unchanged      Progression   Progression Continue to progress workloads to maintain intensity without signs/symptoms of physical distress.    Average METs 2.53      Resistance Training   Training Prescription Yes    Weight 7 lb    Reps 10-15      Interval Training   Interval Training No      Treadmill   MPH 1.7    Grade 0    Minutes 15    METs 2.3      NuStep   Level 5    Minutes 15    METs 2      REL-XR   Level 2    Minutes 15    METs 3.5      Home Exercise Plan   Plans to  continue exercise at Home (comment)   Rec. Elliptical   Frequency Add 2 additional days to program exercise sessions.    Initial Home Exercises Provided 10/04/23      Oxygen   Maintain Oxygen Saturation 88% or higher             Nutrition:  Target Goals: Understanding of nutrition guidelines, daily intake of sodium 1500mg , cholesterol 200mg , calories 30% from fat and 7% or less from saturated fats, daily to have 5 or more servings of fruits and vegetables.  Education: All About Nutrition: -Group instruction provided by verbal, written material, interactive activities, discussions, models, and posters to present general guidelines for heart healthy nutrition including fat, fiber, MyPlate, the role of sodium in heart healthy nutrition, utilization of the nutrition label, and utilization of this knowledge for meal planning. Follow up email sent as well. Written material given at graduation.   Biometrics:  Pre Biometrics - 08/26/23 0911       Pre Biometrics   Height 5\' 10"  (1.778 m)    Weight 213 lb 11.2 oz (96.9 kg)    Waist Circumference 44 inches    Hip Circumference 42 inches    Waist to Hip Ratio 1.05 %    BMI (Calculated) 30.66    Single Leg Stand 8.9 seconds  Nutrition Therapy Plan and Nutrition Goals:  Nutrition Therapy & Goals - 08/26/23 1106       Nutrition Therapy   RD appointment deferred Yes      Intervention Plan   Intervention Prescribe, educate and counsel regarding individualized specific dietary modifications aiming towards targeted core components such as weight, hypertension, lipid management, diabetes, heart failure and other comorbidities.    Expected Outcomes Short Term Goal: Understand basic principles of dietary content, such as calories, fat, sodium, cholesterol and nutrients.;Short Term Goal: A plan has been developed with personal nutrition goals set during dietitian appointment.;Long Term Goal: Adherence to prescribed nutrition  plan.             Nutrition Assessments:  MEDIFICTS Score Key: >=70 Need to make dietary changes  40-70 Heart Healthy Diet <= 40 Therapeutic Level Cholesterol Diet  Flowsheet Row Pulmonary Rehab from [facility]26/2023 [facility]  Picture Your Plate Total Score on Admission 52      Picture Your Plate Scores: <09 Unhealthy dietary pattern with much room for improvement. 41-50 Dietary pattern unlikely to meet recommendations for good health and room for improvement. 51-60 More healthful dietary pattern, with some room for improvement.  >60 Healthy dietary pattern, although there may be some specific behaviors that could be improved.   Nutrition Goals Re-Evaluation:  Nutrition Goals Re-Evaluation     Row Name 09/06/23 1720 10/04/23 1743           Goals   Nutrition Goal -- Patient continues to defer RD apt. He already meets with an RD at the [place].      Comment Patient was informed on why it is important to maintain a balanced diet when dealing with Respiratory issues. Explained that it takes a lot of energy to breath and when they are short of breath often they will need to have a good diet to help keep up with the calories they are expending for breathing. --      Expected Outcome Short: Choose and plan snacks accordingly to patients caloric intake to improve breathing. Long: Maintain a diet independently that meets their caloric intake to aid in daily shortness of breath. --               Nutrition Goals Discharge (Final Nutrition Goals Re-Evaluation):  Nutrition Goals Re-Evaluation - 10/04/23 1743       Goals   Nutrition Goal Patient continues to defer RD apt. He already meets with an RD at the [place].             Psychosocial: Target Goals: Acknowledge presence or absence of significant depression and/or stress, maximize coping skills, provide positive support system. Participant is able to verbalize types and ability to use techniques and skills  needed for reducing stress and depression.   Education: Stress, Anxiety, and Depression - Group verbal and visual presentation to define topics covered.  Reviews how body is impacted by stress, anxiety, and depression.  Also discusses healthy ways to reduce stress and to treat/manage anxiety and depression.  Written material given at graduation.   Education: Sleep Hygiene -Provides group verbal and written instruction about how sleep can affect your health.  Define sleep hygiene, discuss sleep cycles and impact of sleep habits. Review good sleep hygiene tips.    Initial Review & Psychosocial Screening:  Initial Psych Review & Screening - 08/25/23 1148       Initial Review   Current issues with Current Stress Concerns;Current Depression;Current Sleep Concerns  Source of Stress Concerns Chronic Illness      Family Dynamics   Good Support System? Yes   local family and friends     Barriers   Psychosocial barriers to participate in program There are no identifiable barriers or psychosocial needs.;The patient should benefit from training in stress management and relaxation.      Screening Interventions   Interventions Provide feedback about the scores to participant;To provide support and resources with identified psychosocial needs;Encouraged to exercise    Expected Outcomes Short Term goal: Utilizing psychosocial counselor, staff and physician to assist with identification of specific Stressors or current issues interfering with healing process. Setting desired goal for each stressor or current issue identified.;Long Term Goal: Stressors or current issues are controlled or eliminated.;Short Term goal: Identification and review with participant of any Quality of Life or Depression concerns found by scoring the questionnaire.;Long Term goal: The participant improves quality of Life and PHQ9 Scores as seen by post scores and/or verbalization of changes             Quality of Life  Scores:  Scores of 19 and below usually indicate a poorer quality of life in these areas.  A difference of  2-3 points is a clinically meaningful difference.  A difference of 2-3 points in the total score of the Quality of Life Index has been associated with significant improvement in overall quality of life, self-image, physical symptoms, and general health in studies assessing change in quality of life.  PHQ-9: Review Flowsheet  More data exists      10/04/2023 08/26/2023 07/01/2021 06/03/2021 04/29/2021  Depression screen PHQ 2/9  Decreased Interest 0 2 1 2 2   Down, Depressed, Hopeless 1 2 0 1 2  PHQ - 2 Score 1 4 1 3 4   Altered sleeping 3 3 3 3 3   Tired, decreased energy 2 3 3 1 2   Change in appetite 1 2 1 2 2   Feeling bad or failure about yourself  0 1 0 0 0  Trouble concentrating 1 2 1 2 2   Moving slowly or fidgety/restless 0 0 0 0 0  Suicidal thoughts 0 2 0 0 0  PHQ-9 Score 8 17 9 11 13   Difficult doing work/chores Somewhat difficult Somewhat difficult Somewhat difficult Somewhat difficult Not difficult at all   Interpretation of Total Score  Total Score Depression Severity:  1-4 = Minimal depression, 5-9 = Mild depression, 10-14 = Moderate depression, 15-19 = Moderately severe depression, 20-27 = Severe depression   Psychosocial Evaluation and Intervention:  Psychosocial Evaluation - 08/25/23 1150       Psychosocial Evaluation & Interventions   Interventions Encouraged to exercise with the program and follow exercise prescription;Stress management education;Relaxation education    Comments Zavion is returning to pulmonary rehab. He did the program a couple years ago and really enjoyed it, so he is looking forward to attending again. His breathing has gotten worse recently, which is altering his difficult sleep patterns even more. He has had sleep concerns for years and notes he is waking more frequently. He states he recently had a sleep study and was told he needs a cpap, but  nothing has been given to him. He sees the [place] next week and plans to ask about a cpap. He has a history of PTSD which he states he feels is being managed throught [place], support from his family and friends, and setting personal goals. He admits to restarting smoking after 18 months of abstaining and thinks  it was his depression that led him to restarting. However, in November he made the decision to quit again and really wants to stick to staying away from smoking. He wants to come to the program to work on his stamina, balance, and breathing    Expected Outcomes Short: attend pulmonary rehab for education and exercise. Long: develop and maintain positive self care habits.    Continue Psychosocial Services  Follow up required by staff             Psychosocial Re-Evaluation:  Psychosocial Re-Evaluation     Row Name 10/04/23 1751 10/04/23 1752           Psychosocial Re-Evaluation   Current issues with Current Depression;Current Sleep Concerns;History of Depression;Current Stress Concerns;Current Psychotropic Meds Current Depression;Current Sleep Concerns;History of Depression;Current Stress Concerns;Current Psychotropic Meds      Comments -- Jacek's PHQ9 was reevaluated and his score went down from 17 to 8. He states he is on treatment for depression and it is managed.      Expected Outcomes -- Short: continue to monitor mental health and notify doctor is there are any changes. Long: maintain good mental health habits.      Interventions -- Encouraged to attend Pulmonary Rehabilitation for the exercise      Continue Psychosocial Services  -- Follow up required by staff               Psychosocial Discharge (Final Psychosocial Re-Evaluation):  Psychosocial Re-Evaluation - 10/04/23 1752       Psychosocial Re-Evaluation   Current issues with Current Depression;Current Sleep Concerns;History of Depression;Current Stress Concerns;Current Psychotropic Meds    Comments Sajid's PHQ9 was  reevaluated and his score went down from 17 to 8. He states he is on treatment for depression and it is managed.    Expected Outcomes Short: continue to monitor mental health and notify doctor is there are any changes. Long: maintain good mental health habits.    Interventions Encouraged to attend Pulmonary Rehabilitation for the exercise    Continue Psychosocial Services  Follow up required by staff             Education: Education Goals: Education classes will be provided on a weekly basis, covering required topics. Participant will state understanding/return demonstration of topics presented.  Learning Barriers/Preferences:  Learning Barriers/Preferences - 08/25/23 1148       Learning Barriers/Preferences   Learning Barriers None    Learning Preferences None             General Pulmonary Education Topics:  Infection Prevention: - Provides verbal and written material to individual with discussion of infection control including proper hand washing and proper equipment cleaning during exercise session. Flowsheet Row Pulmonary Rehab from 08/26/2023 in [facility] Cardiac and Pulmonary Rehab  Date 08/26/23  Educator NT  Instruction Review Code 1- Verbalizes Understanding       Falls Prevention: - Provides verbal and written material to individual with discussion of falls prevention and safety. Flowsheet Row Pulmonary Rehab from 08/26/2023 in [facility] Cardiac and Pulmonary Rehab  Date 08/26/23  Educator NT  Instruction Review Code 1- Verbalizes Understanding       Chronic Lung Disease Review: - Group verbal instruction with posters, models, PowerPoint presentations and videos,  to review new updates, new respiratory medications, new advancements in procedures and treatments. Providing information on websites and "800" numbers for continued self-education. Includes information about supplement oxygen, available portable oxygen systems, continuous and intermittent flow rates, oxygen  safety, concentrators,  and Medicare reimbursement for oxygen. Explanation of Pulmonary Drugs, including class, frequency, complications, importance of spacers, rinsing mouth after steroid MDI's, and proper cleaning methods for nebulizers. Review of basic lung anatomy and physiology related to function, structure, and complications of lung disease. Review of risk factors. Discussion about methods for diagnosing sleep apnea and types of masks and machines for OSA. Includes a review of the use of types of environmental controls: home humidity, furnaces, filters, dust mite/pet prevention, HEPA vacuums. Discussion about weather changes, air quality and the benefits of nasal washing. Instruction on Warning signs, infection symptoms, calling MD promptly, preventive modes, and value of vaccinations. Review of effective airway clearance, coughing and/or vibration techniques. Emphasizing that all should Create an Action Plan. Written material given at graduation. Flowsheet Row Pulmonary Rehab from 08/26/2023 in [facility] Cardiac and Pulmonary Rehab  Education need identified 08/26/23       AED/CPR: - Group verbal and written instruction with the use of models to demonstrate the basic use of the AED with the basic ABC's of resuscitation.    Anatomy and Cardiac Procedures: - Group verbal and visual presentation and models provide information about basic cardiac anatomy and function. Reviews the testing methods done to diagnose heart disease and the outcomes of the test results. Describes the treatment choices: Medical Management, Angioplasty, or Coronary Bypass Surgery for treating various heart conditions including Myocardial Infarction, Angina, Valve Disease, and Cardiac Arrhythmias.  Written material given at graduation. Flowsheet Row Pulmonary Rehab from 04/17/2021 in [facility] Cardiac and Pulmonary Rehab  Date 04/17/21  Educator KB  Instruction Review Code 1- Verbalizes Understanding       Medication Safety: -  Group verbal and visual instruction to review commonly prescribed medications for heart and lung disease. Reviews the medication, class of the drug, and side effects. Includes the steps to properly store meds and maintain the prescription regimen.  Written material given at graduation. Flowsheet Row Pulmonary Rehab from 08/26/2023 in [facility] Cardiac and Pulmonary Rehab  Education need identified 08/26/23       Other: -Provides group and verbal instruction on various topics (see comments)   Knowledge Questionnaire Score:  Knowledge Questionnaire Score - 08/26/23 1104       Knowledge Questionnaire Score   Pre Score 15/18              Core Components/Risk Factors/Patient Goals at Admission:  Personal Goals and Risk Factors at Admission - 08/25/23 1138       Core Components/Risk Factors/Patient Goals on Admission   Tobacco Cessation Yes   Quit November 2024   Intervention Offer Photographer, assist with locating and accessing local/national Quit Smoking programs, and support quit date choice.;Assist the participant in steps to quit. Provide individualized education and counseling about committing to Tobacco Cessation, relapse prevention, and pharmacological support that can be provided by physician.    Expected Outcomes Short Term: Will quit all tobacco product use, adhering to prevention of relapse plan.;Long Term: Complete abstinence from all tobacco products for at least 12 months from quit date.    Improve shortness of breath with ADL's Yes    Intervention Provide education, individualized exercise plan and daily activity instruction to help decrease symptoms of SOB with activities of daily living.    Expected Outcomes Short Term: Improve cardiorespiratory fitness to achieve a reduction of symptoms when performing ADLs;Long Term: Be able to perform more ADLs without symptoms or delay the onset of symptoms    Diabetes Yes    Intervention Provide education  about signs/symptoms  and action to take for hypo/hyperglycemia.;Provide education about proper nutrition, including hydration, and aerobic/resistive exercise prescription along with prescribed medications to achieve blood glucose in normal ranges: Fasting glucose 65-99 mg/dL    Expected Outcomes Short Term: Participant verbalizes understanding of the signs/symptoms and immediate care of hyper/hypoglycemia, proper foot care and importance of medication, aerobic/resistive exercise and nutrition plan for blood glucose control.;Long Term: Attainment of HbA1C < 7%.    Hypertension Yes    Intervention Provide education on lifestyle modifcations including regular physical activity/exercise, weight management, moderate sodium restriction and increased consumption of fresh fruit, vegetables, and low fat dairy, alcohol moderation, and smoking cessation.;Monitor prescription use compliance.    Expected Outcomes Short Term: Continued assessment and intervention until BP is < 140/39mm HG in hypertensive participants. < 130/21mm HG in hypertensive participants with diabetes, heart failure or chronic kidney disease.;Long Term: Maintenance of blood pressure at goal levels.    Lipids Yes    Intervention Provide education and support for participant on nutrition & aerobic/resistive exercise along with prescribed medications to achieve LDL 70mg , HDL >40mg .    Expected Outcomes Short Term: Participant states understanding of desired cholesterol values and is compliant with medications prescribed. Participant is following exercise prescription and nutrition guidelines.;Long Term: Cholesterol controlled with medications as prescribed, with individualized exercise RX and with personalized nutrition plan. Value goals: LDL < 70mg , HDL > 40 mg.             Education:Diabetes - Individual verbal and written instruction to review signs/symptoms of diabetes, desired ranges of glucose level fasting, after meals and with exercise. Acknowledge that  pre and post exercise glucose checks will be done for 3 sessions at entry of program. Flowsheet Row Pulmonary Rehab from 08/26/2023 in [facility] Cardiac and Pulmonary Rehab  Date 08/25/23  Educator [facility]  Instruction Review Code 1- Verbalizes Understanding       Know Your Numbers and Heart Failure: - Group verbal and visual instruction to discuss disease risk factors for cardiac and pulmonary disease and treatment options.  Reviews associated critical values for Overweight/Obesity, Hypertension, Cholesterol, and Diabetes.  Discusses basics of heart failure: signs/symptoms and treatments.  Introduces Heart Failure Zone chart for action plan for heart failure.  Written material given at graduation.   Core Components/Risk Factors/Patient Goals Review:   Goals and Risk Factor Review     Row Name 09/06/23 1720 10/04/23 1748           Core Components/Risk Factors/Patient Goals Review   Personal Goals Review Improve shortness of breath with ADL's Hypertension;Diabetes;Improve shortness of breath with ADL's      Review Spoke to patient about their shortness of breath and what they can do to improve. Patient has been informed of breathing techniques when starting the program. Patient is informed to tell staff if they have had any med changes and that certain meds they are taking or not taking can be causing shortness of breath. Patient reports he is not dealing with much SOB anymore and is doing well with that. He does occationally check his BP at home and does own a cuff. He was encouraged to become more routine in monitoring is BP at home. He stated he has all the supplies he needs to check his blood sugar but needs to get in a better habit of monitoring it more regularly. He plans to start checking it once a day.      Expected Outcomes Short: Attend [facility] regularly to improve shortness of breath  with ADL's. Long: maintain independence with ADL's Short: start checking blood sugar every day. Long:  independently monitor BP and blood sugar regularly and consistently.               Core Components/Risk Factors/Patient Goals at Discharge (Final Review):   Goals and Risk Factor Review - 10/04/23 1748       Core Components/Risk Factors/Patient Goals Review   Personal Goals Review Hypertension;Diabetes;Improve shortness of breath with ADL's    Review Patient reports he is not dealing with much SOB anymore and is doing well with that. He does occationally check his BP at home and does own a cuff. He was encouraged to become more routine in monitoring is BP at home. He stated he has all the supplies he needs to check his blood sugar but needs to get in a better habit of monitoring it more regularly. He plans to start checking it once a day.    Expected Outcomes Short: start checking blood sugar every day. Long: independently monitor BP and blood sugar regularly and consistently.             ITP Comments:  ITP Comments     Row Name 08/25/23 1144 08/26/23 0908 08/30/23 1728 09/01/23 1316 09/29/23 0838   ITP Comments Initial phone call completed. Diagnosis can be found in [facility] 11/20. EP Orientation scheduled for Thursday 1/9 at 8am. Completed and gym orientation. Initial ITP created and sent for review to Dr. Faud Aleskerov, Medical Director. First full day of exercise!  Patient was oriented to gym and equipment including functions, settings, policies, and procedures.  Patient's individual exercise prescription and treatment plan were reviewed.  All starting workloads were established based on the results of the 6 minute walk test done at initial orientation visit.  The plan for exercise progression was also introduced and progression will be customized based on patient's performance and goals. 30 Day review completed. Medical Director ITP review done, changes made as directed, and signed approval by Medical Director. 30 Day review completed. Medical Director ITP review done, changes made  as directed, and signed approval by Medical Director.    Row Name 10/20/23 1623 10/27/23 0847 11/24/23 1128 12/22/23 1306 01/05/24 1106   ITP Comments Patient states he has a bulging disc and has had an MRI. His doctor suggested he waits to resume rehab until he has talked to a Careers adviser. Informed patient that he will be put on a medical hold and to call back when he is able to speak with the surgeon. 30 Day review completed. Medical Director ITP review done, changes made as directed, and signed approval by Medical Director. 30 Day review completed. Medical Director ITP review done, changes made as directed, and signed approval by Medical Director. 30 Day review completed. Medical Director ITP review done, changes made as directed, and signed approval by Medical Director. We called Mr. Clayburn today as he has not attended rehab since 10/13/2023 due to back pain. He stated that he was seeing a neurosurgeon and was incapable of exercising in rehab at this time. He agreed to discharging from the program. He completed 13/36 sessions.            Comments: Early Discharge ITP

## 2024-01-06 ENCOUNTER — Ambulatory Visit (INDEPENDENT_AMBULATORY_CARE_PROVIDER_SITE_OTHER): Admitting: Neurosurgery

## 2024-01-06 ENCOUNTER — Encounter: Payer: Self-pay | Admitting: Neurosurgery

## 2024-01-06 VITALS — BP 120/68 | Ht 70.0 in | Wt 210.0 lb

## 2024-01-06 DIAGNOSIS — M48062 Spinal stenosis, lumbar region with neurogenic claudication: Secondary | ICD-10-CM

## 2024-01-06 NOTE — Progress Notes (Signed)
 Referring Physician:  Milda Aline, MD [address] [place],  [place] [address]  Primary Physician:  Milda Aline, MD  History of Present Illness: 01/06/2024 Mr. Arboleda returns to see me.  He has continued symptoms.  He would like to try an injection.  11/04/2023 Mr. Onofrio Klemp is here today with a chief complaint of leg discomfort and difficulty walking. He was referred by the [facility] for evaluation of his leg discomfort and difficulty walking.  He experiences bilateral leg and buttock discomfort that worsens with walking or standing and improves with rest. He can walk about a quarter of a mile slowly before discomfort limits him. There is no weakness on the left side, and pain is present in both legs. He has COPD, contributing to shortness of breath during physical activity, and smokes about a pack a day. He is not on oxygen therapy at home.  His symptoms have been ongoing for a year and a half.   Bowel/Bladder Dysfunction: none  Conservative measures: saw a chiropractor many years ago Physical therapy: has not participated in for his back  Multimodal medical therapy including regular antiinflammatories: gabapentin, tylenol , prednisone, medrol dosepak, aleve Injections: no epidural steroid injections  Past Surgery: no spinal surgeries  Nada Auer has no symptoms of cervical myelopathy.  The symptoms are causing a significant impact on the patient's life.   I have utilized the care everywhere function in epic to review the outside records available from external health systems.  Review of Systems:  A 10 point review of systems is negative, except for the pertinent positives and negatives detailed in the HPI.  Past Medical History: Past Medical History:  Diagnosis Date   Chronic kidney disease    COPD (chronic obstructive pulmonary disease) (HCC)    Coronary artery disease    DDD (degenerative disc disease)    Diabetes mellitus without complication  (HCC)    Hyperlipidemia    Hypertension     Past Surgical History: No past surgical history on file.  Allergies: Allergies as of 01/06/2024 - Review Complete 01/06/2024  Allergen Reaction Noted   Sitagliptin Other (See Comments) 07/01/2023    Medications:  Current Outpatient Medications:    albuterol (PROVENTIL HFA;VENTOLIN HFA) 108 (90 Base) MCG/ACT inhaler, Inhale 1 puff into the lungs every 6 (six) hours as needed for wheezing or shortness of breath., Disp: , Rfl:    albuterol (VENTOLIN HFA) 108 (90 Base) MCG/ACT inhaler, Inhale into the lungs., Disp: , Rfl:    Alogliptin Benzoate 25 MG TABS, Take by mouth., Disp: , Rfl:    aspirin EC 81 MG tablet, Take 81 mg by mouth daily.  , Disp: , Rfl:    atorvastatin (LIPITOR) 40 MG tablet, Take 40 mg by mouth daily., Disp: , Rfl:    azelastine  (ASTELIN ) 0.1 % nasal spray, Place 2 sprays into both nostrils 2 (two) times daily as needed for rhinitis. Use in each nostril as directed, Disp: 30 mL, Rfl: 5   cholecalciferol (VITAMIN D) 1000 UNITS tablet, Take 2,000 Units by mouth daily., Disp: , Rfl:    fluticasone  (FLONASE ) 50 MCG/ACT nasal spray, Place 2 sprays into both nostrils daily., Disp: 16 g, Rfl: 5   folic acid (FOLVITE) 1 MG tablet, Take 1 mg by mouth daily., Disp: , Rfl:    gabapentin (NEURONTIN) 300 MG capsule, Take 300 mg by mouth.  TAKE ONE CAPSULE BY MOUTH EVERY MORNING AND TAKE ONE CAPSULE AT NOON AND TAKE TWO CAPSULES EVERY EVENING FOR NERVE  PAIN/BURNING, Disp: , Rfl:    hydrochlorothiazide (HYDRODIURIL) 25 MG tablet, Take 1 tablet by mouth every morning., Disp: , Rfl:    losartan (COZAAR) 100 MG tablet, Take 100 mg by mouth daily., Disp: , Rfl:    Magnesium Oxide 420 MG TABS, Take 420 mg by mouth daily., Disp: , Rfl:    vardenafil (LEVITRA) 20 MG tablet, Take 10 mg by mouth., Disp: , Rfl:   Social History: Social History   Tobacco Use   Smoking status: Every Day    Types: Cigarettes    Passive exposure: Current    Smokeless tobacco: Never   Tobacco comments:    Started back in January 2025  Vaping Use   Vaping status: Never Used  Substance Use Topics   Alcohol use: Yes    Alcohol/week: 2.0 - 3.0 standard drinks of alcohol    Types: 2 - 3 Shots of liquor per week   Drug use: No    Family Medical History: Family History  Problem Relation Age of Onset   Heart disease Mother    Coronary artery disease Mother        CABG   Diabetes type II Mother    Cancer Father        "brain tumors"   Coronary artery disease Sister     Physical Examination: Vitals:   01/06/24 1434  BP: 120/68    General: Patient is in no apparent distress. Attention to examination is appropriate.  Neck:   Supple.  Full range of motion.  Respiratory: Patient is breathing without any difficulty.   NEUROLOGICAL:     Awake, alert, oriented to person, place, and time.  Speech is clear and fluent.   Cranial Nerves: Pupils equal round and reactive to light.  Facial tone is symmetric.  Facial sensation is symmetric. Shoulder shrug is symmetric. Tongue protrusion is midline.  There is no pronator drift.  Strength: Side Biceps Triceps Deltoid Interossei Grip Wrist Ext. Wrist Flex.  R 5 5 5 5 5 5 5   L 5 5 5 5 5 5 5    Side Iliopsoas Quads Hamstring PF DF EHL  R 5 5 5 5 5 5   L 5 5 5 5 5 5    Reflexes are 2+ and symmetric at the biceps, triceps, brachioradialis, patella and achilles.   Hoffman's is absent.   Bilateral upper and lower extremity sensation is intact to light touch.    No evidence of dysmetria noted.  Gait is normal.     Medical Decision Making  Imaging: MRI L spine 10/08/2023 There is disc desiccation and posterior disc bulge with disc osteophyte complex and moderate to advanced bilateral facet degenerative changes with ligamentum flavum hypertrophy.  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  There is severe left and moderate to severe right neuroforaminal narrowing.   At L5-S1, there is severe bilateral facet degenerative changes with ligamentum flavum hypertrophy.  There is no significant canal narrowing.  There is narrowing of the bilateral subarticular zones.  There is moderate to severe bilateral neuroforaminal narrowing.  I have personally reviewed the images and agree with the above interpretation.  Assessment and Plan: Mr. 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  I will send him for an injection.  I would like to see him 2 weeks after his injection.  I think he may be a candidate for L4-S1 decompression.  I spent a total of 10 minutes in this patient's  care today. This time was spent reviewing pertinent records including imaging studies, obtaining and confirming history, performing a directed evaluation, formulating and discussing my recommendations, and documenting the visit within the medical record.    Thank you for involving me in the care of this patient.      Jaidence Geisler K. Mont Antis MD, [facility] Neurosurgery

## 2024-02-22 ENCOUNTER — Ambulatory Visit: Admitting: Pain Medicine

## 2024-02-27 DIAGNOSIS — Z460 Encounter for fitting and adjustment of spectacles and contact lenses: Secondary | ICD-10-CM | POA: Insufficient documentation

## 2024-02-27 DIAGNOSIS — J209 Acute bronchitis, unspecified: Secondary | ICD-10-CM | POA: Insufficient documentation

## 2024-02-27 DIAGNOSIS — F17200 Nicotine dependence, unspecified, uncomplicated: Secondary | ICD-10-CM | POA: Insufficient documentation

## 2024-02-27 DIAGNOSIS — D3132 Benign neoplasm of left choroid: Secondary | ICD-10-CM | POA: Insufficient documentation

## 2024-02-27 DIAGNOSIS — M899 Disorder of bone, unspecified: Secondary | ICD-10-CM | POA: Insufficient documentation

## 2024-02-27 DIAGNOSIS — Z789 Other specified health status: Secondary | ICD-10-CM | POA: Insufficient documentation

## 2024-02-27 DIAGNOSIS — F4323 Adjustment disorder with mixed anxiety and depressed mood: Secondary | ICD-10-CM | POA: Insufficient documentation

## 2024-02-27 DIAGNOSIS — F1020 Alcohol dependence, uncomplicated: Secondary | ICD-10-CM | POA: Insufficient documentation

## 2024-02-27 DIAGNOSIS — Z136 Encounter for screening for cardiovascular disorders: Secondary | ICD-10-CM | POA: Insufficient documentation

## 2024-02-27 DIAGNOSIS — D126 Benign neoplasm of colon, unspecified: Secondary | ICD-10-CM | POA: Insufficient documentation

## 2024-02-27 DIAGNOSIS — Z122 Encounter for screening for malignant neoplasm of respiratory organs: Secondary | ICD-10-CM | POA: Insufficient documentation

## 2024-02-27 DIAGNOSIS — Z23 Encounter for immunization: Secondary | ICD-10-CM | POA: Insufficient documentation

## 2024-02-27 DIAGNOSIS — L439 Lichen planus, unspecified: Secondary | ICD-10-CM | POA: Insufficient documentation

## 2024-02-27 DIAGNOSIS — R21 Rash and other nonspecific skin eruption: Secondary | ICD-10-CM | POA: Insufficient documentation

## 2024-02-27 DIAGNOSIS — G894 Chronic pain syndrome: Secondary | ICD-10-CM | POA: Insufficient documentation

## 2024-02-27 DIAGNOSIS — M545 Low back pain, unspecified: Secondary | ICD-10-CM | POA: Insufficient documentation

## 2024-02-27 DIAGNOSIS — J069 Acute upper respiratory infection, unspecified: Secondary | ICD-10-CM | POA: Insufficient documentation

## 2024-02-27 DIAGNOSIS — G603 Idiopathic progressive neuropathy: Secondary | ICD-10-CM | POA: Insufficient documentation

## 2024-02-27 DIAGNOSIS — R0600 Dyspnea, unspecified: Secondary | ICD-10-CM | POA: Insufficient documentation

## 2024-02-27 DIAGNOSIS — W19XXXA Unspecified fall, initial encounter: Secondary | ICD-10-CM | POA: Insufficient documentation

## 2024-02-27 DIAGNOSIS — Z719 Counseling, unspecified: Secondary | ICD-10-CM | POA: Insufficient documentation

## 2024-02-27 DIAGNOSIS — Z79899 Other long term (current) drug therapy: Secondary | ICD-10-CM | POA: Insufficient documentation

## 2024-02-27 DIAGNOSIS — N400 Enlarged prostate without lower urinary tract symptoms: Secondary | ICD-10-CM | POA: Insufficient documentation

## 2024-02-27 DIAGNOSIS — N200 Calculus of kidney: Secondary | ICD-10-CM | POA: Insufficient documentation

## 2024-02-27 DIAGNOSIS — F4310 Post-traumatic stress disorder, unspecified: Secondary | ICD-10-CM | POA: Insufficient documentation

## 2024-02-27 DIAGNOSIS — G5711 Meralgia paresthetica, right lower limb: Secondary | ICD-10-CM | POA: Insufficient documentation

## 2024-02-27 DIAGNOSIS — J441 Chronic obstructive pulmonary disease with (acute) exacerbation: Secondary | ICD-10-CM | POA: Insufficient documentation

## 2024-02-27 DIAGNOSIS — L959 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, unspecified: Secondary | ICD-10-CM | POA: Insufficient documentation

## 2024-02-27 DIAGNOSIS — H259 Unspecified age-related cataract: Secondary | ICD-10-CM | POA: Insufficient documentation

## 2024-02-27 DIAGNOSIS — Z0101 Encounter for examination of eyes and vision with abnormal findings: Secondary | ICD-10-CM | POA: Insufficient documentation

## 2024-02-27 DIAGNOSIS — H1131 Conjunctival hemorrhage, right eye: Secondary | ICD-10-CM | POA: Insufficient documentation

## 2024-02-27 DIAGNOSIS — L309 Dermatitis, unspecified: Secondary | ICD-10-CM | POA: Insufficient documentation

## 2024-02-27 DIAGNOSIS — N401 Enlarged prostate with lower urinary tract symptoms: Secondary | ICD-10-CM | POA: Insufficient documentation

## 2024-02-27 DIAGNOSIS — J3089 Other allergic rhinitis: Secondary | ICD-10-CM | POA: Insufficient documentation

## 2024-02-27 DIAGNOSIS — K760 Fatty (change of) liver, not elsewhere classified: Secondary | ICD-10-CM | POA: Insufficient documentation

## 2024-02-27 DIAGNOSIS — R918 Other nonspecific abnormal finding of lung field: Secondary | ICD-10-CM | POA: Insufficient documentation

## 2024-02-27 NOTE — Progress Notes (Unsigned)
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 results found for: VITAMINB12, FOLATE, HGBA1C, HIV   CNS No results found for: COLORCSF, APPEARCSF, RBCCOUNTCSF, WBCCSF, POLYSCSF, LYMPHSCSF, EOSCSF, PROTEINCSF, GLUCCSF, JCVIRUS, CSFOLI, IGGCSF, LABACHR, ACETBL   Inflammation (CRP: Acute  ESR: Chronic) No results found for: CRP, ESRSEDRATE, LATICACIDVEN   Rheumatology No results found for: RF, ANA, LABURIC, URICUR, LYMEIGGIGMAB, LYMEABIGMQN, HLAB27  Coagulation Lab Results  Component Value Date   INR 1.04 07/06/2011   LABPROT 13.8 07/06/2011   APTT 31 07/06/2011   PLT 183 07/15/2020     Cardiovascular Lab Results  Component  Value Date   TROPONINI 0.03 (HH) 06/12/2017   HGB 15.1 07/15/2020   HCT 43.8 07/15/2020     Screening Lab Results  Component Value Date   SARSCOV2NAA Not Detected 02/23/2019     Cancer No results found for: CEA, CA125, LABCA2   Allergens No results found for: ALMOND, APPLE, ASPARAGUS, AVOCADO, BANANA, BARLEY, BASIL, BAYLEAF, GREENBEAN, LIMABEAN, WHITEBEAN, BEEFIGE, REDBEET, BLUEBERRY, BROCCOLI, CABBAGE, MELON, CARROT, CASEIN, CASHEWNUT, CAULIFLOWER, CELERY     Note: Lab results reviewed.  PFSH  Drug: Mr. Delair  reports no history of drug use. Alcohol:  reports current alcohol use of about 2.0 - 3.0 standard drinks of alcohol per week. Tobacco:  reports that he has been smoking cigarettes. He has been exposed to tobacco smoke. He has never used smokeless tobacco. Medical:  has a past medical history of Chronic kidney disease, COPD (chronic obstructive pulmonary disease) (HCC), Coronary artery disease, DDD (degenerative disc disease), Diabetes mellitus without complication (HCC), Hyperlipidemia, and Hypertension. Family: family history includes Cancer in his father; Coronary artery disease in his mother and sister; Diabetes type II in his mother; Heart disease in his mother.  No past surgical history on file. 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 (HCC) 08/25/2023   Vertigo 08/25/2023   Acute upper respiratory infection, unspecified 02/27/2024    Adjustment disorder with mixed anxiety and depressed mood 02/27/2024   Age-related cataract of both eyes 02/27/2024   Benign neoplasm of colon 02/27/2024   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/27/2024   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   Benign prostatic hyperplasia with lower urinary tract symptoms 02/27/2024   Conjunctival hemorrhage, right eye 02/27/2024   Continuous chronic alcoholism (HCC) 02/27/2024   Dyspnea, unspecified 02/27/2024   Encounter for examination of eyes and vision with abnormal findings 02/27/2024   Encounter for screening for malignant neoplasm of respiratory organs 02/27/2024   Encounter for screening for cardiovascular disorders 02/27/2024   Encounter for immunization 02/27/2024   Encounter for fitting and adjustment of spectacles and contact lenses 02/27/2024   Counseling, unspecified 02/27/2024   Exposure to potentially hazardous substance 08/17/1966   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/2024   Kidney stone 02/27/2024   Lichen planus, unspecified 02/27/2024   Low back pain 02/27/2024   Meralgia paresthetica of right side 02/27/2024   Multiple nodules of lung 02/27/2024   Nicotine dependence 02/27/2024   Occupational exposure to environmental pollution 08/17/1966   Posttraumatic stress disorder 02/27/2024   Rash and other nonspecific skin eruption 02/27/2024   Steatosis of liver 02/27/2024   Unspecified fall, initial encounter 02/27/2024   Dermatitis, unspecified 02/27/2024   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, unspecified 02/27/2024   Eczema 02/27/2024   Chronic rhinitis 08/17/1968   Acute bronchitis, unspecified 02/27/2024   Other allergic rhinitis 02/27/2024   Chronic obstructive pulmonary disease with (acute) exacerbation (HCC) 02/27/2024   Exposure  to asbestos 08/17/1966   Chronic pain syndrome 02/27/2024   Pharmacologic therapy 02/27/2024   Disorder of skeletal system 02/27/2024   Problems influencing health status 02/27/2024   Resolved  Ambulatory Problems    Diagnosis Date Noted   No Resolved Ambulatory Problems   Past Medical History:  Diagnosis Date   Chronic kidney disease    COPD (chronic obstructive pulmonary disease) (HCC)    Coronary artery disease    DDD (degenerative disc disease)    Diabetes mellitus without complication (HCC)    Hypertension    Constitutional Exam  General appearance: Well nourished, well developed, and well hydrated. In no apparent acute distress There were no vitals filed for this visit. BMI Assessment: Estimated body mass index is 30.13 kg/m as calculated from the following:   Height as of 01/06/24: 5' 10 (1.778 m).   Weight as of 01/06/24: 210 lb (95.3 kg).  BMI interpretation table: BMI level Category Range association with higher incidence of chronic pain  <18 kg/m2 Underweight   18.5-24.9 kg/m2 Ideal body weight   25-29.9 kg/m2 Overweight Increased incidence by 20%  30-34.9 kg/m2 Obese (Class I) Increased incidence by 68%  35-39.9 kg/m2 Severe obesity (Class II) Increased incidence by 136%  >40 kg/m2 Extreme obesity (Class III) Increased incidence by 254%   Patient's current BMI Ideal Body weight  There is no height or weight on file to calculate BMI. Patient weight not recorded   BMI Readings from Last 4 Encounters:  01/06/24 30.13 kg/m  11/04/23 30.13 kg/m  08/26/23 30.66 kg/m  05/07/23 29.69 kg/m   Wt Readings from Last 4 Encounters:  01/06/24 210 lb (95.3 kg)  11/04/23 210 lb (95.3 kg)  08/26/23 213 lb 11.2 oz (96.9 kg)  05/07/23 208 lb 6.4 oz (94.5 kg)    Psych/Mental status: Alert, oriented x 3 (person, place, & time)       Eyes: PERLA Respiratory: No evidence of acute respiratory distress  Assessment  Primary Diagnosis & Pertinent Problem List: The primary encounter diagnosis was Chronic pain syndrome. Diagnoses of Pharmacologic therapy, Disorder of skeletal system, and Problems influencing health status were also pertinent to this visit.  Visit  Diagnosis (New problems to examiner): 1. Chronic pain syndrome   2. Pharmacologic therapy   3. Disorder of skeletal system   4. Problems influencing health status    Plan of Care (Initial workup plan)  Note: Mr. Jantz was reminded that as per protocol, today's visit has been an evaluation only. We have not taken over the patient's controlled substance management.  Problem-specific plan: Assessment and Plan            Lab Orders  No laboratory test(s) ordered today   Imaging Orders  No imaging studies ordered today   Referral Orders  No referral(s) requested today   Procedure Orders    No procedure(s) ordered today   Pharmacotherapy (current): Medications ordered:  No orders of the defined types were placed in this encounter.  Medications administered during this visit: Christain R. Crapps had no medications administered during this visit.   Analgesic Pharmacotherapy:  Opioid Analgesics: For patients currently taking or requesting to take opioid analgesics, in accordance with [place]  Medical Board Guidelines, we will assess their risks and indications for the use of these substances. After completing our evaluation, we may offer recommendations, but we no longer take patients for medication management. The prescribing physician will ultimately decide, based on his/her training and level of comfort whether to adopt any of  the recommendations, including whether or not to prescribe such medicines.  Membrane stabilizer: To be determined at a later time  Muscle relaxant: To be determined at a later time  NSAID: To be determined at a later time  Other analgesic(s): To be determined at a later time   Interventional management options: Mr. Desa was informed that there is no guarantee that he would be a candidate for interventional therapies. The decision will be based on the results of diagnostic studies, as well as Mr. Dani's risk profile.  Procedure(s) under  consideration:  Pending results of ordered studies     Interventional Therapies  Risk Factors  Considerations  Medical Comorbidities:     Planned  Pending:      Under consideration:   Pending   Completed: (Analgesic benefit)1  None at this time   Therapeutic  Palliative (PRN) options:   None established   Completed by other providers:   None reported  1(Analgesic benefit): Expressed in percentage (%). (Local anesthetic[LA] +/- sedation  L.A.Local Anesthetic  Steroid benefit  Ongoing benefit)   Provider-requested follow-up: No follow-ups on file.  Future Appointments  Date Time Provider Department Center  02/29/2024  2:00 PM Tanya Glisson, MD [facility] None   I discussed the assessment and treatment plan with the patient. The patient was provided an opportunity to ask questions and all were answered. The patient agreed with the plan and demonstrated an understanding of the instructions.  Patient advised to call back or seek an in-person evaluation if the symptoms or condition worsens.  Duration of encounter: *** minutes.  Total time on encounter, as per AMA guidelines included both the face-to-face and non-face-to-face time personally spent by the physician and/or other qualified health care professional(s) on the day of the encounter (includes time in activities that require the physician or other qualified health care professional and does not include time in activities normally performed by clinical staff). Physician's time may include the following activities when performed: Preparing to see the patient (e.g., pre-charting review of records, searching for previously ordered imaging, lab work, and nerve conduction tests) Review of prior analgesic pharmacotherapies. Reviewing PMP Interpreting ordered tests (e.g., lab work, imaging, nerve conduction tests) Performing post-procedure evaluations, including interpretation of diagnostic procedures Obtaining and/or  reviewing separately obtained history Performing a medically appropriate examination and/or evaluation Counseling and educating the patient/family/caregiver Ordering medications, tests, or procedures Referring and communicating with other health care professionals (when not separately reported) Documenting clinical information in the electronic or other health record Independently interpreting results (not separately reported) and communicating results to the patient/ family/caregiver Care coordination (not separately reported)  Note by: Glisson DELENA Tanya, MD (TTS and AI technology used. I apologize for any typographical errors that were not detected and corrected.) Date: 02/29/2024; Time: 7:44 PM

## 2024-02-29 ENCOUNTER — Ambulatory Visit (HOSPITAL_BASED_OUTPATIENT_CLINIC_OR_DEPARTMENT_OTHER): Admitting: Pain Medicine

## 2024-02-29 DIAGNOSIS — Z91199 Patient's noncompliance with other medical treatment and regimen due to unspecified reason: Secondary | ICD-10-CM

## 2024-02-29 DIAGNOSIS — I2510 Atherosclerotic heart disease of native coronary artery without angina pectoris: Secondary | ICD-10-CM | POA: Insufficient documentation

## 2024-02-29 DIAGNOSIS — G894 Chronic pain syndrome: Secondary | ICD-10-CM

## 2024-02-29 DIAGNOSIS — Z79899 Other long term (current) drug therapy: Secondary | ICD-10-CM

## 2024-02-29 DIAGNOSIS — M899 Disorder of bone, unspecified: Secondary | ICD-10-CM

## 2024-02-29 DIAGNOSIS — G8929 Other chronic pain: Secondary | ICD-10-CM

## 2024-02-29 DIAGNOSIS — Z789 Other specified health status: Secondary | ICD-10-CM

## 2024-03-30 ENCOUNTER — Telehealth: Payer: Self-pay | Admitting: Internal Medicine

## 2024-03-30 NOTE — Telephone Encounter (Signed)
 Spoke with pt and informed him the current authorization expires on 04/14/2024 and a new authorization has been sent in and for pt to call the [place] for a new authorization as well.

## 2024-07-04 ENCOUNTER — Encounter: Payer: Self-pay | Admitting: Neurosurgery

## 2024-07-04 ENCOUNTER — Ambulatory Visit: Admitting: Neurosurgery

## 2024-07-04 ENCOUNTER — Other Ambulatory Visit: Payer: Self-pay

## 2024-07-04 VITALS — BP 110/70 | Ht 69.5 in | Wt 199.2 lb

## 2024-07-04 DIAGNOSIS — Z01818 Encounter for other preprocedural examination: Secondary | ICD-10-CM

## 2024-07-04 DIAGNOSIS — M48062 Spinal stenosis, lumbar region with neurogenic claudication: Secondary | ICD-10-CM

## 2024-07-04 NOTE — Patient Instructions (Signed)
 Please see below for information in regards to your upcoming surgery:   Planned surgery: L4-S1 posterior spinal decompression   Surgery date: 08/21/2024 at [facility] (Medical Mall: [address] [place], [place] [address]) - you will find out your arrival time the business day before your surgery.   Pre-op appointment at [facility] Pre-admit Testing: you will receive a call with a date/time for this appointment. If you are scheduled for an in person appointment, Pre-admit Testing is located on the first floor of the [facility] building, [address], [address]. During this appointment, they will advise you which medications you can take the morning of surgery, and which medications you will need to hold for surgery. Labs (such as blood work, EKG) may be done at your pre-op appointment. You are not required to fast for these labs. Should you need to change your pre-op appointment, please call Pre-admit testing at [phone].     Blood thinners:   Aspirin 81mg :   stop aspirin 7 days prior, resume aspirin 14 days after     Diabetes/heart failure/kidney disease/weight loss medications that require an extended hold: Per anesthesia guidelines (due to the increased risk of aspiration caused by delayed gastric emptying):  Semaglutide (Ozempic) injections: hold for 7 days prior to surgery Empagliflozin (Jardiance): hold for 3 days prior to surgery Metformin : hold for 2 days prior to surgery   Surgical clearance: we will send a clearance form to [facility]). They may wish to see you in their office prior to signing the clearance form. If so, they may call you to schedule an appointment.     Common restrictions after spine surgery: No bending, lifting, or twisting ("BLT"). Avoid lifting objects heavier than 10 pounds for the first 6 weeks after surgery. Where possible, avoid household activities that involve lifting, bending,  reaching, pushing, or pulling such as laundry, vacuuming, grocery shopping, and childcare. Try to arrange for help from friends and family for these activities while you heal. Do not drive while taking prescription pain medication. Weeks 6 through 12 after surgery: avoid lifting more than 25 pounds.     How to contact us :  If you have any questions/concerns before or after surgery, you can reach us  at [phone], or you can send a mychart message. We can be reached by phone or mychart 8am-4pm, Monday-Friday.  *Please note: Calls after 4pm are forwarded to a third party answering service. Mychart messages are not routinely monitored during evenings, weekends, and holidays. Please call our office to contact the answering service for urgent concerns during non-business hours.    If you have FMLA/disability paperwork, please drop it off or fax it to [phone]   Appointments/FMLA & disability paperwork: Reche Hait, & Nichole Registered Nurse/Surgery scheduler: Azaliyah Kennard, RN & Katie, RN Certified Medical Assistants: Don, CMA, Elenor, CMA, Damien, CMA, & Auston, [place] Physician Assistants: Lyle Decamp, PA-C, Edsel Goods, PA-C & Glade Boys, PA-C Surgeons: Penne Sharps, MD & Reeves Daisy, MD   [facility] PREADMIT TESTING VISIT and SURGERY INFORMATION SHEET   Now that surgery has been scheduled you can anticipate several phone calls from [facility] services. A pharmacy technician will call you to verify your current list of medications taken at home.               The Pre-Service Center will call to verify your insurance information and to give you billing estimates and information.  The Preadmit Testing Office will be calling to schedule a visit to obtain information for the anesthesia team and provide instructions on preparation for surgery.  What can you expect for the Preadmit Testing Visit: Appointments may be scheduled in-person or by  telephone.  If a telephone visit is scheduled, you may be asked to come into the office to have lab tests or other studies performed.   This visit will not be completed any greater than 14 days prior to your surgery.  If your surgery has been scheduled for a future date, please do not be alarmed if we have not contacted you to schedule an appointment more than a month prior to the surgery date.    Please be prepared to provide the following information during this appointment:            -Personal medical history                                               -Medication and allergy  list            -Any history of problems with anesthesia              -Recent lab work or diagnostic studies            -Please notify us  of any needs we should be aware of to provide the best care possible           -You will be provided with instructions on how to prepare for your surgery.    On The Day of Surgery:  You must have a driver to take you home after surgery, you will be asked not to drive for 24 hours following surgery.  Taxi, Gisele and non-medical transport will not be acceptable means of transportation unless you have a responsible individual who will be traveling with you.  Visitors in the surgical area:   2 people will be able to visit you in your room once your preparation for surgery has been completed. During surgery, your visitors will be asked to wait in the Surgery Waiting Area.  It is not a requirement for them to stay, if they prefer to leave and come back.  Your visitor(s) will be given an update once the surgery has been completed.  No visitors are allowed in the initial recovery room to respect patient privacy and safety.  Once you are more awake and transfer to the secondary recovery area, or are transferred to an inpatient room, visitors will again be able to see you.  To respect and protect your privacy: We will ask on the day of surgery who your driver will be and what the contact  number for that individual will be. We will ask if it is okay to share information with this individual, or if there is an alternative individual that we, or the surgeon, should contact to provide updates and information. If family or friends come to the surgical information desk requesting information about you, who you have not listed with us , no information will be given.   It may be helpful to designate someone as the main contact who will be responsible for updating your other friends and family.    PREADMIT TESTING OFFICE: [phone] SAME DAY SURGERY: [phone] We look forward to caring for you before and throughout the process of your surgery.

## 2024-07-04 NOTE — Progress Notes (Signed)
 Referring Physician:  Joshua Bruckner, MD [address] [place],  [place] [address]  Primary Physician:  Joshua Bruckner, MD  History of Present Illness: 07/04/2024 Robert Combs returns to see me.  He has continued symptoms as noted below.  01/06/2024 Robert Combs returns to see me.  He has continued symptoms.  He would like to try an injection.  11/04/2023 Robert Combs is here today with a chief complaint of leg discomfort and difficulty walking. He was referred by the [facility] for evaluation of his leg discomfort and difficulty walking.  He experiences bilateral leg and buttock discomfort that worsens with walking or standing and improves with rest. He can walk about a quarter of a mile slowly before discomfort limits him. There is no weakness on the left side, and pain is present in both legs. He has COPD, contributing to shortness of breath during physical activity, and smokes about a pack a day. He is not on oxygen therapy at home.  His symptoms have been ongoing for a year and a half.   Bowel/Bladder Dysfunction: none  Conservative measures: saw a chiropractor many years ago Physical therapy: has not participated in for his back  Multimodal medical therapy including regular antiinflammatories: gabapentin, tylenol , prednisone, medrol dosepak, aleve Injections: no epidural steroid injections  Past Surgery: no spinal surgeries  Robert Combs has no symptoms of cervical myelopathy.  The symptoms are causing a significant impact on the patient's life.   I have utilized the care everywhere function in epic to review the outside records available from external health systems.  Review of Systems:  A 10 point review of systems is negative, except for the pertinent positives and negatives detailed in the HPI.  Past Medical History: Past Medical History:  Diagnosis Date   Chronic kidney disease    COPD (chronic obstructive pulmonary disease) (HCC)    Coronary artery  disease    DDD (degenerative disc disease)    Diabetes mellitus without complication (HCC)    Hyperlipidemia    Hypertension     Past Surgical History: No past surgical history on file.  Allergies: Allergies as of 07/04/2024 - Review Complete 01/06/2024  Allergen Reaction Noted   Sitagliptin Other (See Comments) 07/01/2023    Medications:  Current Outpatient Medications:    albuterol (PROVENTIL HFA;VENTOLIN HFA) 108 (90 Base) MCG/ACT inhaler, Inhale 1 puff into the lungs every 6 (six) hours as needed for wheezing or shortness of breath., Disp: , Rfl:    albuterol (VENTOLIN HFA) 108 (90 Base) MCG/ACT inhaler, Inhale into the lungs., Disp: , Rfl:    Alogliptin Benzoate 25 MG TABS, Take by mouth., Disp: , Rfl:    aspirin EC 81 MG tablet, Take 81 mg by mouth daily.  , Disp: , Rfl:    atorvastatin (LIPITOR) 40 MG tablet, Take 40 mg by mouth daily., Disp: , Rfl:    azelastine  (ASTELIN ) 0.1 % nasal spray, Place 2 sprays into both nostrils 2 (two) times daily as needed for rhinitis. Use in each nostril as directed, Disp: 30 mL, Rfl: 5   cholecalciferol (VITAMIN D) 1000 UNITS tablet, Take 2,000 Units by mouth daily., Disp: , Rfl:    fluticasone  (FLONASE ) 50 MCG/ACT nasal spray, Place 2 sprays into both nostrils daily., Disp: 16 g, Rfl: 5   folic acid (FOLVITE) 1 MG tablet, Take 1 mg by mouth daily., Disp: , Rfl:    hydrochlorothiazide (HYDRODIURIL) 25 MG tablet, Take 1 tablet by mouth every morning., Disp: , Rfl:  losartan (COZAAR) 100 MG tablet, Take 100 mg by mouth daily., Disp: , Rfl:    Magnesium Oxide 420 MG TABS, Take 420 mg by mouth daily., Disp: , Rfl:    vardenafil (LEVITRA) 20 MG tablet, Take 10 mg by mouth., Disp: , Rfl:   Social History: Social History   Tobacco Use   Smoking status: Every Day    Types: Cigarettes    Passive exposure: Current   Smokeless tobacco: Never   Tobacco comments:    Started back in January 2025  Vaping Use   Vaping status: Never Used   Substance Use Topics   Alcohol use: Yes    Alcohol/week: 2.0 - 3.0 standard drinks of alcohol    Types: 2 - 3 Shots of liquor per week   Drug use: No    Family Medical History: Family History  Problem Relation Age of Onset   Heart disease Mother    Coronary artery disease Mother        CABG   Diabetes type II Mother    Cancer Father        brain tumors   Coronary artery disease Sister     Physical Examination: Vitals:   07/04/24 0910  BP: 110/70    General: Patient is in no apparent distress. Attention to examination is appropriate.  Neck:   Supple.  Full range of motion.  Respiratory: Patient is breathing without any difficulty.   NEUROLOGICAL:     Awake, alert, oriented to person, place, and time.  Speech is clear and fluent.   Cranial Nerves: Pupils equal round and reactive to light.  Facial tone is symmetric.  Facial sensation is symmetric. Shoulder shrug is symmetric. Tongue protrusion is midline.  There is no pronator drift.  Strength: Side Biceps Triceps Deltoid Interossei Grip Wrist Ext. Wrist Flex.  R 5 5 5 5 5 5 5   L 5 5 5 5 5 5 5    Side Iliopsoas Quads Hamstring PF DF EHL  R 5 5 5 5 5 5   L 5 5 5 5 5 5    Reflexes are 2+ and symmetric at the biceps, triceps, brachioradialis, patella and achilles.   Hoffman's is absent.   Bilateral upper and lower extremity sensation is intact to light touch.    No evidence of dysmetria noted.  Gait is normal.     Medical Decision Making  Imaging: MRI L spine 10/08/2023 There is disc desiccation and posterior disc bulge with disc osteophyte complex and moderate to advanced bilateral facet degenerative changes with ligamentum flavum hypertrophy.  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  There is severe left and moderate to severe right neuroforaminal narrowing.  At L5-S1, there is severe bilateral facet degenerative changes with ligamentum flavum hypertrophy.  There is no  significant canal narrowing.  There is narrowing of the bilateral subarticular zones.  There is moderate to severe bilateral neuroforaminal narrowing.  I have personally reviewed the images and agree with the above interpretation.  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  He has tried and failed conservative management.  At this point, I have recommended L4-S1 decompression  I discussed the planned procedure at length with the patient, including the risks, benefits, alternatives, and indications. The risks discussed include but are not limited to bleeding, infection, need for reoperation, spinal fluid leak, stroke, vision loss, anesthetic complication, coma, paralysis, and even death. I also described in detail  that improvement was not guaranteed.  The patient expressed understanding of these risks, and asked that we proceed with surgery. I described the surgery in layman's terms, and gave ample opportunity for questions, which were answered to the best of my ability.  I spent a total of 10 minutes in this patient's care today. This time was spent reviewing pertinent records including imaging studies, obtaining and confirming history, performing a directed evaluation, formulating and discussing my recommendations, and documenting the visit within the medical record.    Thank you for involving me in the care of this patient.      Alantra Popoca K. Clois MD, [facility] Neurosurgery

## 2024-08-08 ENCOUNTER — Encounter
Admission: RE | Admit: 2024-08-08 | Discharge: 2024-08-08 | Disposition: A | Discharge status: Home / Self Care | Source: Ambulatory Visit | Attending: Neurosurgery | Admitting: Neurosurgery

## 2024-08-08 ENCOUNTER — Other Ambulatory Visit: Payer: Self-pay

## 2024-08-08 VITALS — BP 114/61 | HR 55 | Resp 14 | Ht 69.5 in | Wt 196.0 lb

## 2024-08-08 DIAGNOSIS — Z01818 Encounter for other preprocedural examination: Secondary | ICD-10-CM

## 2024-08-08 DIAGNOSIS — E118 Type 2 diabetes mellitus with unspecified complications: Secondary | ICD-10-CM | POA: Insufficient documentation

## 2024-08-08 DIAGNOSIS — J441 Chronic obstructive pulmonary disease with (acute) exacerbation: Secondary | ICD-10-CM

## 2024-08-08 DIAGNOSIS — Z01810 Encounter for preprocedural cardiovascular examination: Secondary | ICD-10-CM | POA: Diagnosis present

## 2024-08-08 DIAGNOSIS — I4510 Unspecified right bundle-branch block: Secondary | ICD-10-CM | POA: Insufficient documentation

## 2024-08-08 DIAGNOSIS — Z01812 Encounter for preprocedural laboratory examination: Secondary | ICD-10-CM | POA: Diagnosis present

## 2024-08-08 DIAGNOSIS — J449 Chronic obstructive pulmonary disease, unspecified: Secondary | ICD-10-CM | POA: Diagnosis present

## 2024-08-08 DIAGNOSIS — I10 Essential (primary) hypertension: Secondary | ICD-10-CM | POA: Insufficient documentation

## 2024-08-08 HISTORY — DX: Sleep apnea, unspecified: G47.30

## 2024-08-08 LAB — CBC
HCT: 48.2 % (ref 39.0–52.0)
Hemoglobin: 16.3 g/dL (ref 13.0–17.0)
MCH: 32.4 pg (ref 26.0–34.0)
MCHC: 33.8 g/dL (ref 30.0–36.0)
MCV: 95.8 fL (ref 80.0–100.0)
Platelets: 190 K/uL (ref 150–400)
RBC: 5.03 MIL/uL (ref 4.22–5.81)
RDW: 13.1 % (ref 11.5–15.5)
WBC: 6.2 K/uL (ref 4.0–10.5)
nRBC: 0 % (ref 0.0–0.2)

## 2024-08-08 LAB — URINALYSIS, COMPLETE (UACMP) WITH MICROSCOPIC
Bacteria, UA: NONE SEEN
Bilirubin Urine: NEGATIVE
Glucose, UA: >=500 mg/dL — AB
Hgb urine dipstick: NEGATIVE
Ketones, ur: NEGATIVE mg/dL
Leukocytes,Ua: NEGATIVE
Nitrite: NEGATIVE
Protein, ur: NEGATIVE mg/dL
Specific Gravity, Urine: 1.022 (ref 1.005–1.030)
Squamous Epithelial / HPF: 0 /HPF (ref 0–5)
pH: 6 (ref 5.0–8.0)

## 2024-08-08 LAB — HEMOGLOBIN A1C
Hgb A1c MFr Bld: 6.4 % — ABNORMAL HIGH (ref 4.8–5.6)
Mean Plasma Glucose: 136.98 mg/dL

## 2024-08-08 LAB — BASIC METABOLIC PANEL WITH GFR
Anion gap: 13 (ref 5–15)
BUN: 20 mg/dL (ref 8–23)
CO2: 26 mmol/L (ref 22–32)
Calcium: 9.6 mg/dL (ref 8.9–10.3)
Chloride: 102 mmol/L (ref 98–111)
Creatinine, Ser: 1.04 mg/dL (ref 0.61–1.24)
GFR, Estimated: >60 mL/min
Glucose, Bld: 95 mg/dL (ref 70–99)
Potassium: 4 mmol/L (ref 3.5–5.1)
Sodium: 141 mmol/L (ref 135–145)

## 2024-08-08 LAB — SURGICAL PCR SCREEN
MRSA, PCR: NEGATIVE
Staphylococcus aureus: NEGATIVE

## 2024-08-08 LAB — TYPE AND SCREEN
ABO/RH(D): A NEG
Antibody Screen: NEGATIVE

## 2024-08-08 NOTE — Patient Instructions (Addendum)
 Your procedure is scheduled on: Monday 08/21/24 To find out your arrival time, please call [phone] between 1PM - 3PM on: Friday 08/18/24 Report to the Registration Desk on the 1st floor of the [facility]. If your arrival time is 6:00 am, do not arrive before that time as the [facility] entrance doors do not open until 6:00 am.  REMEMBER: Instructions that are not followed completely may result in serious medical risk, up to and including death; or upon the discretion of your surgeon and anesthesiologist your surgery may need to be rescheduled.  Do not eat food after midnight the night before surgery.  No gum chewing or hard candies.  You may however, drink CLEAR liquids up to 2 hours before you are scheduled to arrive for your surgery. Do not drink anything within 2 hours of your scheduled arrival time.  Clear liquids include: - water only  **Type 1 and Type 2 diabetics should only drink water.**   One week prior to surgery: Stop Anti-inflammatories (NSAIDS) such as Advil, Aleve, Ibuprofen, Motrin, Naproxen, Naprosyn and Aspirin based products such as Excedrin, Goody's Powder, BC Powder.  You may however, continue to take Tylenol  if needed for pain up until the day of surgery.  Stop ANY OVER THE COUNTER supplements and vitamins for at least 7 days until after surgery.  **Follow guidelines for insulin and diabetes medications.** Hold your Ozempic for 7 days with last dose Sunday 08/13/25. Hold Jardiance for 3 days with last dose Thursday 08/17/24. Hold Metformin  for 2 days with last dose Friday 08/18/24 (per pt no longer taking metformin )  **Follow recommendations regarding stopping blood thinners.** Hold Aspirin for 7 days with last dose Sunday 08/13/24.   No Erectile Dysfunction drugs (vardenafil (LEVITRA) for 48 hours before your surgery   Continue taking all of your other prescription medications up until the day of surgery.  ON THE DAY OF SURGERY ONLY TAKE THESE MEDICATIONS  WITH SIPS OF WATER:  none  Use inhalers on the day of surgery and bring to the hospital.  No Alcohol for 24 hours before or after surgery.  No Smoking including e-cigarettes for 24 hours before surgery.  No chewable tobacco products for at least 6 hours before surgery.  No nicotine patches on the day of surgery.  Do not use any recreational drugs for at least a week (preferably 2 weeks) before your surgery.  Please be advised that the combination of cocaine and anesthesia may have negative outcomes, up to and including death. If you test positive for cocaine, your surgery will be cancelled.  On the morning of surgery brush your teeth with toothpaste and water, you may rinse your mouth with mouthwash if you wish. Do not swallow any toothpaste or mouthwash.  Use CHG Soap or wipes as directed on instruction sheet. (You can pick this up at our office in the [facility], the building to the left of the [employer], [address] at [address]  Do not shave body hair from the neck down 48 hours before surgery.  Do not wear lotions, powders, or perfumes.   Wear comfortable clothing (specific to your surgery type) to the hospital.  Do not wear jewelry, make-up, hairpins, clips or nail polish.  For welded (permanent) jewelry: bracelets, anklets, waist bands, etc.  Please have this removed prior to surgery.  If it is not removed, there is a chance that hospital personnel will need to cut it off on the day of  surgery.  Contact lenses, hearing aids and dentures may not be worn into surgery. Bring a case for your glasses.  Do not bring valuables to the hospital. [facility] is not responsible for any missing/lost belongings or valuables.   Bring your C-PAP to the hospital in case you may have to spend the night. (May leave in the car)  Notify your doctor if there is any change in your medical condition (cold, fever, infection).  After surgery, you can help  prevent lung complications by doing breathing exercises.  Take deep breaths and cough every 1-2 hours. Your doctor may order a device called an Incentive Spirometer to help you take deep breaths.  If you are being discharged the day of surgery, you will not be allowed to drive home. You will need a responsible individual to drive you home and stay with you for 24 hours after surgery.   Please call the Pre-admissions Testing Dept. at [phone] if you have any questions about these instructions.  Surgery Visitation Policy:  Patients having surgery or a procedure may have two visitors.  Children under the age of 79 must have an adult with them who is not the patient.   Merchandiser, Retail to address health-related social needs:  [email or web address]    Pre-operative 4 CHG Bath Instructions   You can play a key role in reducing the risk of infection after surgery. Your skin needs to be as free of germs as possible. You can reduce the number of germs on your skin by washing with CHG (chlorhexidine gluconate) soap before surgery. CHG is an antiseptic soap that kills germs and continues to kill germs even after washing.   DO NOT use if you have an allergy  to chlorhexidine/CHG or antibacterial soaps. If your skin becomes reddened or irritated, stop using the CHG and notify one of our RNs at [phone].   Please shower with the CHG soap starting 4 days before surgery using the following schedule:   Thursday 08/17/24 - Sunday 08/20/24    Please keep in mind the following:  DO NOT shave, including legs and underarms, starting the day of your first shower.   You may shave your face at any point before/day of surgery.  Place clean sheets on your bed the day you start using CHG soap. Use a clean washcloth (not used since being washed) for each shower. DO NOT sleep with pets once you start using the CHG.   CHG Shower Instructions:  If you choose to wash your hair and  private area, wash first with your normal shampoo/soap.  After you use shampoo/soap, rinse your hair and body thoroughly to remove shampoo/soap residue.  Turn the water OFF and apply about 3 tablespoons (45 ml) of CHG soap to a CLEAN washcloth.  Apply CHG soap ONLY FROM YOUR NECK DOWN TO YOUR TOES (washing for 3-5 minutes)  DO NOT use CHG soap on face, private areas, open wounds, or sores.  Pay special attention to the area where your surgery is being performed.  If you are having back surgery, having someone wash your back for you may be helpful. Wait 2 minutes after CHG soap is applied, then you may rinse off the CHG soap.  Pat dry with a clean towel  Put on clean clothes/pajamas   If you choose to wear lotion, please use ONLY the CHG-compatible lotions on the back of this paper.     Additional instructions for the day of surgery: DO NOT APPLY any lotions,  deodorants, cologne, or perfumes.   Put on clean/comfortable clothes.  Brush your teeth.  Ask your nurse before applying any prescription medications to the skin.      CHG Compatible Lotions   Aveeno Moisturizing lotion  Cetaphil Moisturizing Cream  Cetaphil Moisturizing Lotion  Clairol Herbal Essence Moisturizing Lotion, Dry Skin  Clairol Herbal Essence Moisturizing Lotion, Extra Dry Skin  Clairol Herbal Essence Moisturizing Lotion, Normal Skin  Curel Age Defying Therapeutic Moisturizing Lotion with Alpha Hydroxy  Curel Extreme Care Body Lotion  Curel Soothing Hands Moisturizing Hand Lotion  Curel Therapeutic Moisturizing Cream, Fragrance-Free  Curel Therapeutic Moisturizing Lotion, Fragrance-Free  Curel Therapeutic Moisturizing Lotion, Original Formula  Eucerin Daily Replenishing Lotion  Eucerin Dry Skin Therapy Plus Alpha Hydroxy Crme  Eucerin Dry Skin Therapy Plus Alpha Hydroxy Lotion  Eucerin Original Crme  Eucerin Original Lotion  Eucerin Plus Crme Eucerin Plus Lotion  Eucerin TriLipid Replenishing Lotion  Keri  Anti-Bacterial Hand Lotion  Keri Deep Conditioning Original Lotion Dry Skin Formula Softly Scented  Keri Deep Conditioning Original Lotion, Fragrance Free Sensitive Skin Formula  Keri Lotion Fast Absorbing Fragrance Free Sensitive Skin Formula  Keri Lotion Fast Absorbing Softly Scented Dry Skin Formula  Keri Original Lotion  Keri Skin Renewal Lotion Keri Silky Smooth Lotion  Keri Silky Smooth Sensitive Skin Lotion  Nivea Body Creamy Conditioning Oil  Nivea Body Extra Enriched Teacher, Adult Education Moisturizing Lotion Nivea Crme  Nivea Skin Firming Lotion  NutraDerm 30 Skin Lotion  NutraDerm Skin Lotion  NutraDerm Therapeutic Skin Cream  NutraDerm Therapeutic Skin Lotion  ProShield Protective Hand Cream  Provon moisturizing lotion

## 2024-08-20 MED ORDER — CHLORHEXIDINE GLUCONATE 0.12 % MT SOLN: 15.0000 mL | Freq: Once | OROMUCOSAL | Status: DC

## 2024-08-20 MED ORDER — ORAL CARE MOUTH RINSE: 15.0000 mL | Freq: Once | OROMUCOSAL | Status: DC

## 2024-08-20 MED ORDER — SODIUM CHLORIDE 0.9 % IV SOLN: INTRAVENOUS | Status: DC

## 2024-08-21 ENCOUNTER — Ambulatory Visit

## 2024-08-21 ENCOUNTER — Ambulatory Visit
Admission: RE | Admit: 2024-08-21 | Discharge: 2024-08-21 | Disposition: A | Discharge status: Home / Self Care | Attending: Neurosurgery | Admitting: Neurosurgery

## 2024-08-21 ENCOUNTER — Other Ambulatory Visit: Payer: Self-pay

## 2024-08-21 ENCOUNTER — Ambulatory Visit: Admitting: Anesthesiology

## 2024-08-21 ENCOUNTER — Encounter
Admission: RE | Disposition: A | Discharge status: Home / Self Care | Payer: Self-pay | Source: Home / Self Care | Attending: Neurosurgery

## 2024-08-21 ENCOUNTER — Encounter: Payer: Self-pay | Admitting: Neurosurgery

## 2024-08-21 DIAGNOSIS — E118 Type 2 diabetes mellitus with unspecified complications: Secondary | ICD-10-CM

## 2024-08-21 DIAGNOSIS — F17210 Nicotine dependence, cigarettes, uncomplicated: Secondary | ICD-10-CM | POA: Diagnosis not present

## 2024-08-21 DIAGNOSIS — N189 Chronic kidney disease, unspecified: Secondary | ICD-10-CM | POA: Diagnosis not present

## 2024-08-21 DIAGNOSIS — I2510 Atherosclerotic heart disease of native coronary artery without angina pectoris: Secondary | ICD-10-CM | POA: Insufficient documentation

## 2024-08-21 DIAGNOSIS — E1122 Type 2 diabetes mellitus with diabetic chronic kidney disease: Secondary | ICD-10-CM | POA: Diagnosis not present

## 2024-08-21 DIAGNOSIS — Z01818 Encounter for other preprocedural examination: Secondary | ICD-10-CM

## 2024-08-21 DIAGNOSIS — I129 Hypertensive chronic kidney disease with stage 1 through stage 4 chronic kidney disease, or unspecified chronic kidney disease: Secondary | ICD-10-CM | POA: Insufficient documentation

## 2024-08-21 DIAGNOSIS — M48062 Spinal stenosis, lumbar region with neurogenic claudication: Secondary | ICD-10-CM | POA: Diagnosis not present

## 2024-08-21 DIAGNOSIS — J449 Chronic obstructive pulmonary disease, unspecified: Secondary | ICD-10-CM | POA: Insufficient documentation

## 2024-08-21 HISTORY — PX: LUMBAR LAMINECTOMY/DECOMPRESSION MICRODISCECTOMY: SHX5026

## 2024-08-21 LAB — GLUCOSE, CAPILLARY
Glucose-Capillary: 132 mg/dL — ABNORMAL HIGH (ref 70–99)
Glucose-Capillary: 161 mg/dL — ABNORMAL HIGH (ref 70–99)

## 2024-08-21 LAB — ABO/RH: ABO/RH(D): A NEG

## 2024-08-21 SURGERY — LUMBAR LAMINECTOMY/DECOMPRESSION MICRODISCECTOMY 2 LEVELS
Anesthesia: General

## 2024-08-21 MED ORDER — DEXAMETHASONE SOD PHOSPHATE PF 10 MG/ML IJ SOLN
INTRAMUSCULAR | Status: DC | PRN
  Administered 2024-08-21: 10 mg via INTRAVENOUS

## 2024-08-21 MED ORDER — FENTANYL CITRATE (PF) 100 MCG/2ML IJ SOLN
INTRAMUSCULAR | Status: AC
  Filled 2024-08-21: qty 2

## 2024-08-21 MED ORDER — METHYLPREDNISOLONE ACETATE 40 MG/ML IJ SUSP
INTRAMUSCULAR | Status: DC | PRN
  Administered 2024-08-21: 40 mg

## 2024-08-21 MED ORDER — OXYCODONE HCL 5 MG PO TABS
5.0000 mg | ORAL_TABLET | Freq: Once | ORAL | Status: AC | PRN
  Administered 2024-08-21: 5 mg via ORAL

## 2024-08-21 MED ORDER — LIDOCAINE HCL (CARDIAC) PF 100 MG/5ML IV SOSY
PREFILLED_SYRINGE | INTRAVENOUS | Status: DC | PRN
  Administered 2024-08-21: 60 mg via INTRAVENOUS

## 2024-08-21 MED ORDER — FENTANYL CITRATE (PF) 100 MCG/2ML IJ SOLN
INTRAMUSCULAR | Status: DC | PRN
  Administered 2024-08-21 (×4): 50 ug via INTRAVENOUS

## 2024-08-21 MED ORDER — METHYLPREDNISOLONE ACETATE 40 MG/ML IJ SUSP
INTRAMUSCULAR | Status: AC
  Filled 2024-08-21: qty 1

## 2024-08-21 MED ORDER — SUCCINYLCHOLINE CHLORIDE 200 MG/10ML IV SOSY
PREFILLED_SYRINGE | INTRAVENOUS | Status: DC | PRN
  Administered 2024-08-21: 140 mg via INTRAVENOUS

## 2024-08-21 MED ORDER — BUPIVACAINE-EPINEPHRINE (PF) 0.5% -1:200000 IJ SOLN
INTRAMUSCULAR | Status: DC | PRN
  Administered 2024-08-21: 4.5 mL

## 2024-08-21 MED ORDER — OXYCODONE HCL 5 MG/5ML PO SOLN: 5.0000 mg | Freq: Once | ORAL | Status: AC | PRN

## 2024-08-21 MED ORDER — LABETALOL HCL 5 MG/ML IV SOLN
INTRAVENOUS | Status: AC
  Filled 2024-08-21: qty 4

## 2024-08-21 MED ORDER — CEFAZOLIN IN SODIUM CHLORIDE 2-0.9 GM/100ML-% IV SOLN
2.0000 g | Freq: Once | INTRAVENOUS | Status: AC
  Administered 2024-08-21: 2 g via INTRAVENOUS

## 2024-08-21 MED ORDER — CEFAZOLIN SODIUM-DEXTROSE 2-4 GM/100ML-% IV SOLN
INTRAVENOUS | Status: AC
  Filled 2024-08-21: qty 100

## 2024-08-21 MED ORDER — BUPIVACAINE LIPOSOME 1.3 % IJ SUSP
INTRAMUSCULAR | Status: AC
  Filled 2024-08-21: qty 20

## 2024-08-21 MED ORDER — SURGIFLO WITH THROMBIN (HEMOSTATIC MATRIX KIT) OPTIME
TOPICAL | Status: DC | PRN
  Administered 2024-08-21: 1 via TOPICAL

## 2024-08-21 MED ORDER — POLYETHYLENE GLYCOL 3350 17 GM/SCOOP PO POWD
17.0000 g | Freq: Every day | ORAL | 0 refills | Status: DC | PRN
  Filled 2024-08-21: qty 238, 14d supply, fill #0

## 2024-08-21 MED ORDER — PHENYLEPHRINE 80 MCG/ML (10ML) SYRINGE FOR IV PUSH (FOR BLOOD PRESSURE SUPPORT)
PREFILLED_SYRINGE | INTRAVENOUS | Status: DC | PRN
  Administered 2024-08-21 (×2): 80 ug via INTRAVENOUS

## 2024-08-21 MED ORDER — 0.9 % SODIUM CHLORIDE (POUR BTL) OPTIME
TOPICAL | Status: DC | PRN
  Administered 2024-08-21: 1000 mL

## 2024-08-21 MED ORDER — FENTANYL CITRATE (PF) 100 MCG/2ML IJ SOLN: 25.0000 ug | INTRAMUSCULAR | Status: DC | PRN

## 2024-08-21 MED ORDER — CHLORHEXIDINE GLUCONATE 0.12 % MT SOLN
OROMUCOSAL | Status: AC
  Filled 2024-08-21: qty 15

## 2024-08-21 MED ORDER — SODIUM CHLORIDE (PF) 0.9 % IJ SOLN
INTRAMUSCULAR | Status: AC
  Filled 2024-08-21: qty 20

## 2024-08-21 MED ORDER — OXYCODONE HCL 5 MG PO TABS
5.0000 mg | ORAL_TABLET | ORAL | 0 refills | Status: AC | PRN
  Filled 2024-08-21: qty 30, 5d supply, fill #0

## 2024-08-21 MED ORDER — SODIUM CHLORIDE (PF) 0.9 % IJ SOLN: INTRAMUSCULAR | Status: DC | PRN

## 2024-08-21 MED ORDER — BUPIVACAINE-EPINEPHRINE (PF) 0.5% -1:200000 IJ SOLN
INTRAMUSCULAR | Status: AC
  Filled 2024-08-21: qty 10

## 2024-08-21 MED ORDER — BUPIVACAINE HCL (PF) 0.5 % IJ SOLN
INTRAMUSCULAR | Status: DC | PRN
  Administered 2024-08-21: 25 mL

## 2024-08-21 MED ORDER — METHOCARBAMOL 500 MG PO TABS
500.0000 mg | ORAL_TABLET | Freq: Four times a day (QID) | ORAL | 0 refills | Status: DC | PRN
  Filled 2024-08-21: qty 120, 30d supply, fill #0

## 2024-08-21 MED ORDER — SENNA 8.6 MG PO TABS
1.0000 | ORAL_TABLET | Freq: Two times a day (BID) | ORAL | 0 refills | Status: DC | PRN
  Filled 2024-08-21: qty 15, 8d supply, fill #0

## 2024-08-21 MED ORDER — BUPIVACAINE HCL (PF) 0.5 % IJ SOLN
INTRAMUSCULAR | Status: AC
  Filled 2024-08-21: qty 30

## 2024-08-21 MED ORDER — PROPOFOL 10 MG/ML IV BOLUS
INTRAVENOUS | Status: DC | PRN
  Administered 2024-08-21: 150 mg via INTRAVENOUS

## 2024-08-21 MED ORDER — ONDANSETRON HCL 4 MG/2ML IJ SOLN
INTRAMUSCULAR | Status: DC | PRN
  Administered 2024-08-21: 4 mg via INTRAVENOUS

## 2024-08-21 MED ORDER — OXYCODONE HCL 5 MG PO TABS
ORAL_TABLET | ORAL | Status: AC
  Filled 2024-08-21: qty 1

## 2024-08-21 SURGICAL SUPPLY — 27 items
BASIN KIT SINGLE STR (MISCELLANEOUS) ×1 IMPLANT
BUR NEURO DRILL SOFT 3.0X3.8M (BURR) ×1 IMPLANT
DERMABOND ADVANCED .7 DNX12 (GAUZE/BANDAGES/DRESSINGS) ×1 IMPLANT
DRAPE C ARM PK CFD 31 SPINE (DRAPES) ×1 IMPLANT
DRAPE LAPAROTOMY 100X77 ABD (DRAPES) ×1 IMPLANT
DRAPE SPINE LEICA/WILD 54X150 (DRAPES) ×1 IMPLANT
DRSG OPSITE POSTOP 3X4 (GAUZE/BANDAGES/DRESSINGS) ×1 IMPLANT
ELECTRODE EZSTD 165MM 6.5IN (MISCELLANEOUS) ×1 IMPLANT
ELECTRODE REM PT RTRN 9FT ADLT (ELECTROSURGICAL) ×1 IMPLANT
GLOVE BIOGEL PI IND STRL 6.5 (GLOVE) ×1 IMPLANT
GLOVE SURG SYN 6.5 PF PI (GLOVE) ×1 IMPLANT
GLOVE SURG SYN 8.5 PF PI (GLOVE) ×3 IMPLANT
GOWN SRG LRG LVL 4 IMPRV REINF (GOWNS) ×1 IMPLANT
GOWN SRG XL LVL 3 NONREINFORCE (GOWNS) ×1 IMPLANT
KIT SPINAL PRONEVIEW (KITS) ×1 IMPLANT
MANIFOLD NEPTUNE II (INSTRUMENTS) ×1 IMPLANT
NDL SAFETY ECLIP 18X1.5 (MISCELLANEOUS) ×1 IMPLANT
NS IRRIG 500ML POUR BTL (IV SOLUTION) ×1 IMPLANT
PACK LAMINECTOMY ARMC (PACKS) ×1 IMPLANT
PAD ARMBOARD POSITIONER FOAM (MISCELLANEOUS) ×1 IMPLANT
SURGIFLO W/THROMBIN 8M KIT (HEMOSTASIS) ×1 IMPLANT
SUT STRATA 3-0 15 PS-2 (SUTURE) ×1 IMPLANT
SUT VIC AB 0 CT1 27XCR 8 STRN (SUTURE) ×1 IMPLANT
SUT VIC AB 2-0 CT1 18 (SUTURE) ×1 IMPLANT
SYR 30ML LL (SYRINGE) ×1 IMPLANT
SYR 3ML LL SCALE MARK (SYRINGE) ×1 IMPLANT
TRAP FLUID SMOKE EVACUATOR (MISCELLANEOUS) ×1 IMPLANT

## 2024-08-21 NOTE — Anesthesia Procedure Notes (Signed)
 Procedure Name: Intubation Date/Time: 08/21/2024 10:02 AM  Performed by: Niki Manus SAUNDERS, CRNAPre-anesthesia Checklist: Patient identified, Patient being monitored, Timeout performed, Emergency Drugs available and Suction available Patient Re-evaluated:Patient Re-evaluated prior to induction Oxygen Delivery Method: Circle system utilized Preoxygenation: Pre-oxygenation with 100% oxygen Induction Type: IV induction Ventilation: Mask ventilation without difficulty Laryngoscope Size: 4 and McGrath Grade View: Grade I Tube type: Oral Tube size: 7.5 mm Number of attempts: 1 Airway Equipment and Method: Stylet Placement Confirmation: ETT inserted through vocal cords under direct vision, positive ETCO2 and breath sounds checked- equal and bilateral Secured at: 21 cm Tube secured with: Tape Dental Injury: Teeth and Oropharynx as per pre-operative assessment

## 2024-08-21 NOTE — H&P (Signed)
 "   Referring Physician:  No referring provider defined for this encounter.  Primary Physician:  Joshua Bruckner, MD  History of Present Illness: 08/21/2024 [name] presents with continued symptoms.  He is here for surgical intervention.  07/04/2024 [name] returns to see me.  He has continued symptoms as noted below.  01/06/2024 Robert Combs returns to see me.  He has continued symptoms.  He would like to try an injection.  11/04/2023 [name] is here today with a chief complaint of leg discomfort and difficulty walking. He was referred by the [facility] for evaluation of his leg discomfort and difficulty walking.  He experiences bilateral leg and buttock discomfort that worsens with walking or standing and improves with rest. He can walk about a quarter of a mile slowly before discomfort limits him. There is no weakness on the left side, and pain is present in both legs. He has COPD, contributing to shortness of breath during physical activity, and smokes about a pack a day. He is not on oxygen therapy at home.  His symptoms have been ongoing for a year and a half.   Bowel/Bladder Dysfunction: none  Conservative measures: saw a chiropractor many years ago Physical therapy: has not participated in for his back  Multimodal medical therapy including regular antiinflammatories: gabapentin, tylenol , prednisone, medrol  dosepak, aleve Injections: no epidural steroid injections  Past Surgery: no spinal surgeries  Robert Combs has no symptoms of cervical myelopathy.  The symptoms are causing a significant impact on the patient's life.   I have utilized the care everywhere function in epic to review the outside records available from external health systems.  Review of Systems:  A 10 point review of systems is negative, except for the pertinent positives and negatives detailed in the HPI.  Past Medical History: Past Medical History:  Diagnosis Date   Chronic  kidney disease    COPD (chronic obstructive pulmonary disease) (HCC)    Coronary artery disease    DDD (degenerative disc disease)    Diabetes mellitus without complication (HCC)    Hyperlipidemia    Hypertension    Sleep apnea     Past Surgical History: History reviewed. No pertinent surgical history.  Allergies: Allergies as of 07/04/2024 - Review Complete 01/06/2024  Allergen Reaction Noted   Sitagliptin Other (See Comments) 07/01/2023    Medications:  Current Facility-Administered Medications:    0.9 %  sodium chloride  infusion, , Intravenous, Continuous, Mazzoni, Andrea, MD   ceFAZolin  (ANCEF ) IVPB 2g/100 mL premix, 2 g, Intravenous, Once, Clois Fret, MD   chlorhexidine  (PERIDEX ) 0.12 % solution 15 mL, 15 mL, Mouth/Throat, Once **OR** Oral care mouth rinse, 15 mL, Mouth Rinse, Once, Shellie Odor, MD  Social History: Social History   Tobacco Use   Smoking status: Every Day    Current packs/day: 1.00    Types: Cigarettes    Passive exposure: Current   Smokeless tobacco: Never   Tobacco comments:    Started back in January 2025  Vaping Use   Vaping status: Never Used  Substance Use Topics   Alcohol use: Yes    Alcohol/week: 2.0 - 3.0 standard drinks of alcohol    Types: 2 - 3 Shots of liquor per week   Drug use: No    Family Medical History: Family History  Problem Relation Age of Onset   Heart disease Mother    Coronary artery disease Mother        CABG   Diabetes type II Mother  Cancer Father        brain tumors   Coronary artery disease Sister     Physical Examination: Vitals:   08/21/24 0814  BP: 115/77  Pulse: 62  Resp: 19  Temp: 97.8 F (36.6 C)  SpO2: 96%   Heart sounds normal no MRG. Chest Clear to Auscultation Bilaterally.  General: Patient is in no apparent distress. Attention to examination is appropriate.  Neck:   Supple.  Full range of motion.  Respiratory: Patient is breathing without any  difficulty.   NEUROLOGICAL:     Awake, alert, oriented to person, place, and time.  Speech is clear and fluent.   Cranial Nerves: Pupils equal round and reactive to light.  Facial tone is symmetric.  Facial sensation is symmetric. Shoulder shrug is symmetric. Tongue protrusion is midline.  There is no pronator drift.  Strength: Side Biceps Triceps Deltoid Interossei Grip Wrist Ext. Wrist Flex.  R 5 5 5 5 5 5 5   L 5 5 5 5 5 5 5    Side Iliopsoas Quads Hamstring PF DF EHL  R 5 5 5 5 5 5   L 5 5 5 5 5 5    Reflexes are 2+ and symmetric at the biceps, triceps, brachioradialis, patella and achilles.   Hoffman's is absent.   Bilateral upper and lower extremity sensation is intact to light touch.    No evidence of dysmetria noted.  Gait is normal.     Medical Decision Making  Imaging: MRI L spine 10/08/2023 There is disc desiccation and posterior disc bulge with disc osteophyte complex and moderate to advanced bilateral facet degenerative changes with ligamentum flavum hypertrophy.  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  There is severe left and moderate to severe right neuroforaminal narrowing.  At L5-S1, there is severe bilateral facet degenerative changes with ligamentum flavum hypertrophy.  There is no significant canal narrowing.  There is narrowing of the bilateral subarticular zones.  There is moderate to severe bilateral neuroforaminal narrowing.  I have personally reviewed the images and agree with the above interpretation.  Assessment and Plan: Robert Combs is a pleasant 79 y.o. 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  He has tried and failed conservative management.  At this point, I have recommended L4-S1 decompression    Joyce Heitman K. Clois MD, [facility] Neurosurgery  "

## 2024-08-21 NOTE — Anesthesia Postprocedure Evaluation (Signed)
"   Anesthesia Post Note  Patient: Robert Combs  Procedure(s) Performed: LUMBAR LAMINECTOMY/DECOMPRESSION MICRODISCECTOMY 2 LEVELS  Patient location during evaluation: PACU Anesthesia Type: General Level of consciousness: awake and alert Pain management: pain level controlled Vital Signs Assessment: post-procedure vital signs reviewed and stable Respiratory status: spontaneous breathing, nonlabored ventilation, respiratory function stable and patient connected to nasal cannula oxygen Cardiovascular status: blood pressure returned to baseline and stable Postop Assessment: no apparent nausea or vomiting Anesthetic complications: no   No notable events documented.   Last Vitals:  Vitals:   08/21/24 1246 08/21/24 1308  BP: (!) 155/74 (!) 149/68  Pulse: (!) 53 62  Resp: 14   Temp:  (!) 36.1 C  SpO2: 95% 97%    Last Pain:  Vitals:   08/21/24 1308  TempSrc: Temporal  PainSc: 0-No pain                 Lendia LITTIE Mae      "

## 2024-08-21 NOTE — Discharge Instructions (Addendum)

## 2024-08-21 NOTE — Anesthesia Preprocedure Evaluation (Addendum)
"                                    Anesthesia Evaluation  Patient identified by MRN, date of birth, ID band Patient awake    Reviewed: Allergy  & Precautions, NPO status , Patient's Chart, lab work & pertinent test results  History of Anesthesia Complications Negative for: history of anesthetic complications  Airway Mallampati: III  TM Distance: >3 FB Neck ROM: full    Dental  (+) Chipped   Pulmonary sleep apnea and Continuous Positive Airway Pressure Ventilation , COPD,  COPD inhaler, Current Smoker and Patient abstained from smoking.   Pulmonary exam normal        Cardiovascular hypertension, On Medications + CAD  Normal cardiovascular exam     Neuro/Psych  PSYCHIATRIC DISORDERS Anxiety      Neuromuscular disease    GI/Hepatic Neg liver ROS,GERD  ,,  Endo/Other  diabetes, Type 2    Renal/GU CRFRenal disease     Musculoskeletal   Abdominal   Peds  Hematology negative hematology ROS (+)   Anesthesia Other Findings Past Medical History: No date: Chronic kidney disease No date: COPD (chronic obstructive pulmonary disease) (HCC) No date: Coronary artery disease No date: DDD (degenerative disc disease) No date: Diabetes mellitus without complication (HCC) No date: Hyperlipidemia No date: Hypertension No date: Sleep apnea  No past surgical history on file.     Reproductive/Obstetrics negative OB ROS                              Anesthesia Physical Anesthesia Plan  ASA: 3  Anesthesia Plan: General ETT   Post-op Pain Management: Dilaudid IV, Toradol IV (intra-op)* and Ofirmev  IV (intra-op)*   Induction: Intravenous  PONV Risk Score and Plan: 2 and Ondansetron , Dexamethasone  and Treatment may vary due to age or medical condition  Airway Management Planned: Oral ETT  Additional Equipment:   Intra-op Plan:   Post-operative Plan: Extubation in OR  Informed Consent: I have reviewed the patients History and  Physical, chart, labs and discussed the procedure including the risks, benefits and alternatives for the proposed anesthesia with the patient or authorized representative who has indicated his/her understanding and acceptance.     Dental Advisory Given  Plan Discussed with: Anesthesiologist, CRNA and Surgeon  Anesthesia Plan Comments: (Patient consented for risks of anesthesia including but not limited to:  - adverse reactions to medications - damage to eyes, teeth, lips or other oral mucosa - nerve damage due to positioning  - sore throat or hoarseness - Damage to heart, brain, nerves, lungs, other parts of body or loss of life  Patient voiced understanding and assent.)         Anesthesia Quick Evaluation  "

## 2024-08-21 NOTE — Transfer of Care (Signed)
 Immediate Anesthesia Transfer of Care Note  Patient: Robert Combs  Procedure(s) Performed: LUMBAR LAMINECTOMY/DECOMPRESSION MICRODISCECTOMY 2 LEVELS  Patient Location: PACU  Anesthesia Type:General  Level of Consciousness: sedated  Airway & Oxygen Therapy: Patient Spontanous Breathing and Patient connected to face mask oxygen  Post-op Assessment: Report given to RN and Post -op Vital signs reviewed and stable  Post vital signs: Reviewed and stable  Last Vitals:  Vitals Value Taken Time  BP 175/82 08/21/24 12:00  Temp 36.2 C 08/21/24 11:57  Pulse 65 08/21/24 12:02  Resp 22 08/21/24 12:02  SpO2 100 % 08/21/24 12:02  Vitals shown include unfiled device data.  Last Pain:  Vitals:   08/21/24 1157  PainSc: 0-No pain         Complications: No notable events documented.

## 2024-08-21 NOTE — Op Note (Signed)
 Indications: 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 The patient tried and failed conservative management, prompting surgical intervention.  Findings: stenosis  Preoperative Diagnosis: Lumbar Stenosis with neurogenic claudication Postoperative Diagnosis: same   EBL: 10 ml IVF:see anesthesia record Drains: none Disposition: Extubated and Stable to PACU Complications: none  No foley catheter was placed.   Preoperative Note:  Risks of surgery discussed include: infection, bleeding, stroke, coma, death, paralysis, CSF leak, nerve/spinal cord injury, numbness, tingling, weakness, complex regional pain syndrome, recurrent stenosis and/or disc herniation, vascular injury, development of instability, neck/back pain, need for further surgery, persistent symptoms, development of deformity, and the risks of anesthesia. The patient understood these risks and agreed to proceed.  Operative Note:   1. L4-5 and L5-S1 lumbar decompression including central laminectomy and bilateral medial facetectomies including foraminotomies  The patient was then brought from the preoperative center with intravenous access established.  The patient underwent general anesthesia and endotracheal tube intubation, and was then rotated on the [place] rail top where all pressure points were appropriately padded.  The skin was then thoroughly cleansed.  Perioperative antibiotic prophylaxis was administered.  Sterile prep and drapes were then applied and a timeout was then observed.  C-arm was brought into the field under sterile conditions and under lateral visualization the L5-S1 interspace was identified and marked.  The incision was marked on the right and injected with local anesthetic. Once this was complete a 4 cm incision was opened with the use of a #10 blade knife.    The metrx tubes were sequentially advanced and confirmed in position at L5-S1. An 18mm  by 50mm tube was locked in place to the bed side attachment.  The microscope was then sterilely brought into the field and muscle creep was hemostased with a bipolar and resected with a pituitary rongeur.  A Bovie extender was then used to expose the spinous process and lamina.  Careful attention was placed to not violate the facet capsule. A 3 mm matchstick drill bit was then used to make a hemi-laminotomy trough until the ligamentum flavum was exposed.  This was extended to the base of the spinous process and to the contralateral side to remove all the central bone from each side.  Once this was complete and the underlying ligamentum flavum was visualized, it was dissected with a curette and resected with Kerrison rongeurs.  Extensive ligamentum hypertrophy was noted, requiring a substantial amount of time and care for removal.  The dura was identified and palpated. The kerrison rongeur was then used to remove the medial facet bilaterally until no compression was noted.  A balltip probe was used to confirm decompression of the ipsilateral S1 nerve root.  Additional attention was paid to completion of the contralateral L5-S1 foraminotomy until the contralateral traversing nerve root was completely free.  Once this was complete, L5-S1 central decompression including medial facetectomy and foraminotomy was confirmed and decompression on both sides was confirmed. No CSF leak was noted. The tubes were removed.  After performing the decompression at L5-S1, the metrx tubes were sequentially advanced and confirmed in position at L4-5. An 18mm by 70mm tube was locked in place to the bed side attachment.  Fluoroscopy was then removed from the field.  The microscope was then sterilely brought into the field and muscle creep was hemostased with a bipolar and resected with a pituitary rongeur.  A Bovie extender was then used to expose the spinous process and lamina.  Careful attention  was placed to not violate the facet  capsule. A 3 mm matchstick drill bit was then used to make a hemi-laminotomy trough until the ligamentum flavum was exposed.  This was extended to the base of the spinous process and to the contralateral side to remove all the central bone from each side.  Once this was complete and the underlying ligamentum flavum was visualized, it was dissected with a curette and resected with Kerrison rongeurs.  Extensive ligamentum hypertrophy was noted, requiring a substantial amount of time and care for removal.  The dura was identified and palpated. The kerrison rongeur was then used to remove the medial facet bilaterally until no compression was noted.  A balltip probe was used to confirm decompression of the ipsilateral L5 nerve root.  Additional attention was paid to completion of the contralateral foraminotomy until the contralateral L5 nerve root was completely free.  Once this was complete, L4-5 central decompression including medial facetectomy and foraminotomy was confirmed and decompression on both sides was confirmed. No CSF leak was noted.   Depo-Medrol  was placed on the nerve root.  The wound was copiously irrigated. The tube system was then removed under microscopic visualization and hemostasis was obtained with a bipolar.    The fascial layer was reapproximated with the use of a 0 Vicryl suture.  Subcutaneous tissue layer was reapproximated using 2-0 Vicryl suture.  3-0 monocryl was placed in subcuticular fashion. The skin was then cleansed and Dermabond was used to close the skin opening.  Patient was then rotated back to the preoperative bed awakened from anesthesia and taken to recovery all counts are correct in this case.  I performed the entire procedure with the assistance of Edsel Goods PA as an designer, television/film set. An assistant was required for this procedure due to the complexity.  The assistant provided assistance in tissue manipulation and suction, and was required for the successful and  safe performance of the procedure. I performed the critical portions of the procedure.   Desiraye Rolfson K. Clois MD

## 2024-08-22 ENCOUNTER — Encounter: Payer: Self-pay | Admitting: Neurosurgery

## 2024-09-01 NOTE — Progress Notes (Unsigned)
" ° °  REFERRING PHYSICIAN:  Joshua Bruckner, Md [address] [place],  [place] [address]  DOS: 08/21/24  L4-L5 and L5-S1 decompression  HISTORY OF PRESENT ILLNESS: Robert Combs is approximately 2 weeks status post above surgery. Was given robaxin  and oxycodone  on discharge from the hospital.   Preop leg pain with walking- he thinks this has improved. He has some mild right lateral thigh pain and some lower back pain.   He is taking prn tylenol . He had vomiting with robaxin  and oxycodone - not sure it was from those or from anesthesia.    PHYSICAL EXAMINATION:  General: Patient is well developed, well nourished, calm, collected, and in no apparent distress.   NEUROLOGICAL:  General: In no acute distress.   Awake, alert, oriented to person, place, and time.  Pupils equal round and reactive to light.  Facial tone is symmetric.     Strength:        Side Iliopsoas Quads Hamstring PF DF EHL  R 5 5 5 5 5 5   L 5 5 5 5 5 5    Incision c/d/i   ROS (Neurologic):  Negative except as noted above  IMAGING: Nothing new to review.   ASSESSMENT/PLAN:  Robert Combs is doing well s/p above surgery. Treatment options reviewed with patient and following plan made:   - I have advised the patient to lift up to 10 pounds until 6 weeks after surgery (follow up with Dr. Clois).  - Reviewed wound care.  - No bending, twisting, or lifting.  - Continue on current medications including prn tylenol .  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 His schedule is flexible.  - Follow up as scheduled in 4 weeks and prn.   Advised to contact the office if any questions or concerns arise.  Glade Boys PA-C Department of neurosurgery "

## 2024-09-06 ENCOUNTER — Encounter: Payer: Self-pay | Admitting: Orthopedic Surgery

## 2024-09-06 ENCOUNTER — Ambulatory Visit: Admitting: Orthopedic Surgery

## 2024-09-06 VITALS — BP 108/82 | Temp 98.5°F | Ht 69.5 in | Wt 191.0 lb

## 2024-09-06 DIAGNOSIS — M48062 Spinal stenosis, lumbar region with neurogenic claudication: Secondary | ICD-10-CM

## 2024-09-06 DIAGNOSIS — Z9889 Other specified postprocedural states: Secondary | ICD-10-CM

## 2024-10-03 ENCOUNTER — Encounter: Admitting: Neurosurgery

## 2024-11-14 ENCOUNTER — Encounter: Admitting: Orthopedic Surgery
